# Patient Record
Sex: Female | Born: 1935
Health system: Southern US, Community
[De-identification: ages and names within clinical notes are randomized; demographics above are authoritative.]

## PROBLEM LIST (undated history)

## (undated) DIAGNOSIS — K5900 Constipation, unspecified: Secondary | ICD-10-CM

## (undated) DIAGNOSIS — K219 Gastro-esophageal reflux disease without esophagitis: Secondary | ICD-10-CM

## (undated) DIAGNOSIS — Z862 Personal history of diseases of the blood and blood-forming organs and certain disorders involving the immune mechanism: Secondary | ICD-10-CM

## (undated) DIAGNOSIS — J309 Allergic rhinitis, unspecified: Secondary | ICD-10-CM

## (undated) DIAGNOSIS — I10 Essential (primary) hypertension: Secondary | ICD-10-CM

## (undated) DIAGNOSIS — Z8639 Personal history of other endocrine, nutritional and metabolic disease: Secondary | ICD-10-CM

## (undated) DIAGNOSIS — F411 Generalized anxiety disorder: Secondary | ICD-10-CM

## (undated) DIAGNOSIS — R413 Other amnesia: Secondary | ICD-10-CM

## (undated) DIAGNOSIS — M1990 Unspecified osteoarthritis, unspecified site: Secondary | ICD-10-CM

## (undated) HISTORY — DX: Gastro-esophageal reflux disease without esophagitis: K21.9

## (undated) HISTORY — DX: Unspecified osteoarthritis, unspecified site: M19.90

## (undated) HISTORY — PX: TOTAL KNEE ARTHROPLASTY: SHX125

## (undated) HISTORY — DX: Generalized anxiety disorder: F41.1

## (undated) HISTORY — DX: Personal history of diseases of the blood and blood-forming organs and certain disorders involving the immune mechanism: Z86.2

## (undated) HISTORY — DX: Personal history of other endocrine, nutritional and metabolic disease: Z86.39

## (undated) HISTORY — DX: Essential (primary) hypertension: I10

## (undated) HISTORY — DX: Constipation, unspecified: K59.00

## (undated) HISTORY — DX: Allergic rhinitis, unspecified: J30.9

---

## 2003-05-29 ENCOUNTER — Encounter: Payer: Self-pay | Admitting: Internal Medicine

## 2003-12-26 ENCOUNTER — Encounter: Payer: Self-pay | Admitting: Internal Medicine

## 2005-04-09 ENCOUNTER — Other Ambulatory Visit: Admission: RE | Admit: 2005-04-09 | Discharge: 2005-04-09 | Payer: Self-pay | Admitting: Family Medicine

## 2005-06-30 ENCOUNTER — Encounter: Admission: RE | Admit: 2005-06-30 | Discharge: 2005-06-30 | Payer: Self-pay | Admitting: Family Medicine

## 2006-01-24 ENCOUNTER — Emergency Department (HOSPITAL_COMMUNITY): Admission: EM | Admit: 2006-01-24 | Discharge: 2006-01-24 | Payer: Self-pay | Admitting: Emergency Medicine

## 2006-03-18 ENCOUNTER — Encounter: Admission: RE | Admit: 2006-03-18 | Discharge: 2006-03-18 | Payer: Self-pay | Admitting: Family Medicine

## 2006-11-11 ENCOUNTER — Encounter: Payer: Self-pay | Admitting: Internal Medicine

## 2006-11-11 ENCOUNTER — Ambulatory Visit: Payer: Self-pay | Admitting: Internal Medicine

## 2006-11-11 DIAGNOSIS — M1990 Unspecified osteoarthritis, unspecified site: Secondary | ICD-10-CM | POA: Insufficient documentation

## 2006-11-11 DIAGNOSIS — F411 Generalized anxiety disorder: Secondary | ICD-10-CM

## 2006-11-11 DIAGNOSIS — I10 Essential (primary) hypertension: Secondary | ICD-10-CM | POA: Insufficient documentation

## 2006-11-11 DIAGNOSIS — J309 Allergic rhinitis, unspecified: Secondary | ICD-10-CM

## 2006-11-11 HISTORY — DX: Allergic rhinitis, unspecified: J30.9

## 2006-11-11 HISTORY — DX: Essential (primary) hypertension: I10

## 2006-11-11 HISTORY — DX: Unspecified osteoarthritis, unspecified site: M19.90

## 2006-11-11 HISTORY — DX: Generalized anxiety disorder: F41.1

## 2006-12-01 ENCOUNTER — Ambulatory Visit: Payer: Self-pay | Admitting: Internal Medicine

## 2006-12-23 ENCOUNTER — Ambulatory Visit: Payer: Self-pay | Admitting: Internal Medicine

## 2007-01-19 ENCOUNTER — Ambulatory Visit: Payer: Self-pay | Admitting: Internal Medicine

## 2007-05-06 ENCOUNTER — Ambulatory Visit: Payer: Self-pay | Admitting: Internal Medicine

## 2007-05-13 ENCOUNTER — Encounter: Payer: Self-pay | Admitting: Internal Medicine

## 2007-07-06 ENCOUNTER — Ambulatory Visit: Payer: Self-pay | Admitting: Internal Medicine

## 2007-07-20 ENCOUNTER — Encounter: Payer: Self-pay | Admitting: Internal Medicine

## 2007-07-28 ENCOUNTER — Encounter: Payer: Self-pay | Admitting: Internal Medicine

## 2007-10-05 ENCOUNTER — Encounter: Payer: Self-pay | Admitting: Internal Medicine

## 2007-10-06 ENCOUNTER — Ambulatory Visit: Payer: Self-pay | Admitting: Internal Medicine

## 2007-10-06 DIAGNOSIS — K219 Gastro-esophageal reflux disease without esophagitis: Secondary | ICD-10-CM

## 2007-10-06 HISTORY — DX: Gastro-esophageal reflux disease without esophagitis: K21.9

## 2007-10-06 LAB — CONVERTED CEMR LAB
Bilirubin Urine: NEGATIVE
Specific Gravity, Urine: 1.01
Urobilinogen, UA: 0.2

## 2007-10-07 LAB — CONVERTED CEMR LAB
ALT: 22 units/L (ref 0–35)
AST: 25 units/L (ref 0–37)
Alkaline Phosphatase: 76 units/L (ref 39–117)
Basophils Absolute: 0 10*3/uL (ref 0.0–0.1)
Basophils Relative: 0.4 % (ref 0.0–1.0)
Bilirubin, Direct: 0.1 mg/dL (ref 0.0–0.3)
CO2: 35 meq/L — ABNORMAL HIGH (ref 19–32)
Chloride: 106 meq/L (ref 96–112)
Cholesterol: 195 mg/dL (ref 0–200)
LDL Cholesterol: 122 mg/dL — ABNORMAL HIGH (ref 0–99)
Lymphocytes Relative: 27.7 % (ref 12.0–46.0)
MCHC: 33.6 g/dL (ref 30.0–36.0)
Neutrophils Relative %: 65.3 % (ref 43.0–77.0)
Potassium: 2.9 meq/L — ABNORMAL LOW (ref 3.5–5.1)
RBC: 4.35 M/uL (ref 3.87–5.11)
RDW: 13.1 % (ref 11.5–14.6)
Sodium: 146 meq/L — ABNORMAL HIGH (ref 135–145)
Total Bilirubin: 0.7 mg/dL (ref 0.3–1.2)
VLDL: 20 mg/dL (ref 0–40)

## 2008-02-09 ENCOUNTER — Ambulatory Visit: Payer: Self-pay | Admitting: Internal Medicine

## 2008-02-09 DIAGNOSIS — Z862 Personal history of diseases of the blood and blood-forming organs and certain disorders involving the immune mechanism: Secondary | ICD-10-CM

## 2008-02-09 DIAGNOSIS — Z8639 Personal history of other endocrine, nutritional and metabolic disease: Secondary | ICD-10-CM | POA: Insufficient documentation

## 2008-02-09 HISTORY — DX: Personal history of diseases of the blood and blood-forming organs and certain disorders involving the immune mechanism: Z86.39

## 2008-02-09 HISTORY — DX: Personal history of diseases of the blood and blood-forming organs and certain disorders involving the immune mechanism: Z86.2

## 2008-02-09 LAB — CONVERTED CEMR LAB
CO2: 32 meq/L (ref 19–32)
Calcium: 9.4 mg/dL (ref 8.4–10.5)
Creatinine, Ser: 0.8 mg/dL (ref 0.4–1.2)
GFR calc non Af Amer: 75 mL/min
Glucose, Bld: 114 mg/dL — ABNORMAL HIGH (ref 70–99)

## 2008-02-22 ENCOUNTER — Telehealth: Payer: Self-pay | Admitting: Internal Medicine

## 2008-02-23 ENCOUNTER — Ambulatory Visit: Payer: Self-pay | Admitting: Internal Medicine

## 2008-02-23 DIAGNOSIS — K5900 Constipation, unspecified: Secondary | ICD-10-CM

## 2008-02-23 HISTORY — DX: Constipation, unspecified: K59.00

## 2008-06-13 ENCOUNTER — Ambulatory Visit: Payer: Self-pay | Admitting: Internal Medicine

## 2008-06-13 DIAGNOSIS — B373 Candidiasis of vulva and vagina: Secondary | ICD-10-CM | POA: Insufficient documentation

## 2008-06-13 DIAGNOSIS — B3731 Acute candidiasis of vulva and vagina: Secondary | ICD-10-CM | POA: Insufficient documentation

## 2008-07-20 ENCOUNTER — Encounter: Payer: Self-pay | Admitting: Internal Medicine

## 2008-12-25 ENCOUNTER — Encounter: Payer: Self-pay | Admitting: Internal Medicine

## 2008-12-26 ENCOUNTER — Ambulatory Visit: Payer: Self-pay | Admitting: Internal Medicine

## 2008-12-26 LAB — CONVERTED CEMR LAB
Albumin: 4.1 g/dL (ref 3.5–5.2)
BUN: 12 mg/dL (ref 6–23)
Basophils Absolute: 0 10*3/uL (ref 0.0–0.1)
CO2: 32 meq/L (ref 19–32)
Calcium: 9.4 mg/dL (ref 8.4–10.5)
Cholesterol: 171 mg/dL (ref 0–200)
Eosinophils Absolute: 0.1 10*3/uL (ref 0.0–0.7)
Glucose, Bld: 104 mg/dL — ABNORMAL HIGH (ref 70–99)
HDL: 56.5 mg/dL (ref >39.00)
Hemoglobin: 12.6 g/dL (ref 12.0–15.0)
Lymphocytes Relative: 25.4 % (ref 12.0–46.0)
Lymphs Abs: 1.7 10*3/uL (ref 0.7–4.0)
MCHC: 33.3 g/dL (ref 30.0–36.0)
Neutro Abs: 4.4 10*3/uL (ref 1.4–7.7)
Platelets: 312 10*3/uL (ref 150.0–400.0)
RDW: 13.8 % (ref 11.5–14.6)
Sodium: 144 meq/L (ref 135–145)
TSH: 4.2 microintl units/mL (ref 0.35–5.50)
Triglycerides: 110 mg/dL (ref 0.0–149.0)

## 2009-01-15 ENCOUNTER — Ambulatory Visit: Payer: Self-pay | Admitting: Internal Medicine

## 2009-01-18 ENCOUNTER — Telehealth (INDEPENDENT_AMBULATORY_CARE_PROVIDER_SITE_OTHER): Payer: Self-pay | Admitting: *Deleted

## 2009-05-01 ENCOUNTER — Telehealth: Payer: Self-pay | Admitting: Internal Medicine

## 2009-06-19 ENCOUNTER — Ambulatory Visit: Payer: Self-pay | Admitting: Internal Medicine

## 2009-07-31 ENCOUNTER — Encounter: Payer: Self-pay | Admitting: Internal Medicine

## 2009-11-08 ENCOUNTER — Encounter: Payer: Self-pay | Admitting: Internal Medicine

## 2009-12-27 ENCOUNTER — Ambulatory Visit: Payer: Self-pay | Admitting: Internal Medicine

## 2009-12-27 LAB — CONVERTED CEMR LAB
Albumin: 4.3 g/dL (ref 3.5–5.2)
Alkaline Phosphatase: 72 units/L (ref 39–117)
BUN: 17 mg/dL (ref 6–23)
Basophils Absolute: 0 10*3/uL (ref 0.0–0.1)
Bilirubin, Direct: 0.1 mg/dL (ref 0.0–0.3)
CO2: 29 meq/L (ref 19–32)
Calcium: 8.9 mg/dL (ref 8.4–10.5)
Creatinine, Ser: 0.8 mg/dL (ref 0.4–1.2)
Eosinophils Absolute: 0.1 10*3/uL (ref 0.0–0.7)
Glucose, Bld: 110 mg/dL — ABNORMAL HIGH (ref 70–99)
HDL: 57.7 mg/dL (ref >39.00)
Lymphocytes Relative: 24.2 % (ref 12.0–46.0)
MCHC: 33.6 g/dL (ref 30.0–36.0)
Neutro Abs: 4.8 10*3/uL (ref 1.4–7.7)
Neutrophils Relative %: 69 % (ref 43.0–77.0)
RDW: 15.8 % — ABNORMAL HIGH (ref 11.5–14.6)
Triglycerides: 120 mg/dL (ref 0.0–149.0)

## 2009-12-31 ENCOUNTER — Telehealth: Payer: Self-pay | Admitting: Internal Medicine

## 2010-04-17 ENCOUNTER — Encounter: Payer: Self-pay | Admitting: Internal Medicine

## 2010-04-17 ENCOUNTER — Ambulatory Visit: Payer: Self-pay | Admitting: Internal Medicine

## 2010-06-23 ENCOUNTER — Encounter: Payer: Self-pay | Admitting: Family Medicine

## 2010-07-02 NOTE — Assessment & Plan Note (Signed)
Summary: 4 mos rov   Vital Signs:  Patient profile:   77 year old female Weight:      194 pounds Temp:     98.1 degrees F oral BP sitting:   128 / 80  (left arm) Cuff size:   regular  Vitals Entered By: Duard Brady LPN (April 17, 2010 8:19 AM) CC: 4 mos rov -doing well Is Patient Diabetic? No   CC:  4 mos rov -doing well.  History of Present Illness: 77 -year-old patient seen today for follow-up of hypertension.  She has osteoarthritis and complains of knee pain.  She has gastroesophageal reflux disease, mild anxiety disorder.  All of which have been stable.  Her weight is 194.  She complains of knee pain interfering with an exercise regimen.  She complains of fatigue.  She is very sedentary.  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 G42P3A0 Hosp age 33 for BP eval Allergic rhinitis Anxiety Hypertension Osteoarthritis GERD  Review of Systems       The patient complains of difficulty walking.  The patient denies anorexia, fever, weight loss, weight gain, vision loss, decreased hearing, hoarseness, chest pain, syncope, dyspnea on exertion, peripheral edema, prolonged cough, headaches, hemoptysis, abdominal pain, melena, hematochezia, severe indigestion/heartburn, hematuria, incontinence, genital sores, muscle weakness, suspicious skin lesions, transient blindness, depression, unusual weight change, abnormal bleeding, enlarged lymph nodes, angioedema, and breast masses.    Physical Exam  General:  overweight-appearing.  normal blood pressureoverweight-appearing.   Head:  Normocephalic and atraumatic without obvious abnormalities. No apparent alopecia or balding. Eyes:  No corneal or conjunctival inflammation noted. EOMI. Perrla. Funduscopic exam benign, without hemorrhages, exudates or papilledema. Vision grossly  normal. Mouth:  Oral mucosa and oropharynx without lesions or exudates.  Teeth in good repair. Neck:  No deformities, masses, or tenderness noted. Lungs:  Normal respiratory effort, chest expands symmetrically. Lungs are clear to auscultation, no crackles or wheezes. Heart:  Normal rate and regular rhythm. S1 and S2 normal without gallop, murmur, click, rub or other extra sounds. Abdomen:  Bowel sounds positive,abdomen soft and non-tender without masses, organomegaly or hernias noted. Msk:  No deformity or scoliosis noted of thoracic or lumbar spine.   Pulses:  R and L carotid,radial,femoral,dorsalis pedis and posterior tibial pulses are full and equal bilaterally Extremities:  No clubbing, cyanosis, edema, or deformity noted with normal full range of motion of all joints.     Impression & Recommendations:  Problem # 1:  OSTEOARTHRITIS (ICD-715.90)  Problem # 2:  HYPERTENSION (ICD-401.9)  Her updated medication list for this problem includes:    Hydrochlorothiazide 25 Mg Tabs (Hydrochlorothiazide) .Marland Kitchen... 1 once daily    Micardis 80 Mg Tabs (Telmisartan) .Marland Kitchen... 1 once daily    Amlodipine Besylate 10 Mg Tabs (Amlodipine besylate) .Marland Kitchen... 1 once daily  Her updated medication list for this problem includes:    Hydrochlorothiazide 25 Mg Tabs (Hydrochlorothiazide) .Marland Kitchen... 1 once daily    Micardis 80 Mg Tabs (Telmisartan) .Marland Kitchen... 1 once daily    Amlodipine Besylate 10 Mg Tabs (Amlodipine besylate) .Marland Kitchen... 1 once daily  Complete Medication List: 1)  Buspar 15 Mg Tabs (Buspirone hcl) .... 1/2 once daily 2)  Hydrochlorothiazide 25 Mg Tabs (Hydrochlorothiazide) .Marland Kitchen.. 1 once daily 3)  Micardis 80 Mg Tabs (Telmisartan) .Marland Kitchen.. 1 once daily 4)  Klor-con M10 10 Meq Tbcr (Potassium chloride crys cr) .Marland KitchenMarland KitchenMarland Kitchen  1 two times a day 5)  Amlodipine Besylate 10 Mg Tabs (Amlodipine besylate) .Marland Kitchen.. 1 once daily 6)  Nystatin-triamcinolone 100000-0.1 Unit/gm-% Oint (Nystatin-triamcinolone) .... Use  twice daily  Patient  Instructions: 1)  Please schedule a follow-up appointment in 4 months. 2)  Limit your Sodium (Salt). 3)  It is important that you exercise regularly at least 20 minutes 5 times a week. If you develop chest pain, have severe difficulty breathing, or feel very tired , stop exercising immediately and seek medical attention. 4)  You need to lose weight. Consider a lower calorie diet and regular exercise.    Orders Added: 1)  Est. Patient Level III [16109]   Immunization History:  Tetanus/Td Immunization History:    Tetanus/Td:  Historical (04/09/2005)  Influenza Immunization History:    Influenza:  Historical (03/16/2010)  Pneumovax Immunization History:    Pneumovax:  Historical (03/02/2007)   Immunization History:  Influenza Immunization History:    Influenza:  Historical (03/16/2010) given at hospital per pt. Earlean Polka

## 2010-07-02 NOTE — Progress Notes (Signed)
Summary:  ? about colonoscopy?  Phone Note Call from Patient   Caller: Patient Call For: Gordy Savers  MD Summary of Call: [employer] company is calling and telling pt that is overdue for her colonscopy, and she understood she was not due until 2013.  Would like to ask Dr. Kirtland Bouchard.  She does not [place] having a [place]  Does not have their phone number. [phone] Initial call taken by: Lynann Beaver CMA,  December 31, 2009 2:02 PM  Follow-up for Phone Call        follow-up screening  colonoscopy, not due until July 2015 Follow-up by: Gordy Savers  MD,  December 31, 2009 5:05 PM  Additional Follow-up for Phone Call Additional follow up Details #1::        Notified. pt. Additional Follow-up by: Lynann Beaver CMA,  January 01, 2010 8:02 AM

## 2010-07-02 NOTE — Letter (Signed)
Summary: [facility]  [facility]   Imported By: Maryln Gottron 11/13/2009 14:28:06  _____________________________________________________________________  External Attachment:    Type:   Image     Comment:   External Document

## 2010-07-02 NOTE — Assessment & Plan Note (Signed)
Summary: PT WILL COME IN FASTING/NJR   Vital Signs:  Patient profile:   77 year old female Height:      61.75 inches Weight:      195 pounds BMI:     36.09 Temp:     98.6 degrees F oral Pulse rate:   62 / minute Pulse rhythm:   regular Resp:     16 per minute BP sitting:   130 / 80  (right arm) Cuff size:   regular  Vitals Entered By: Duard Brady LPN (December 27, 2009 8:31 AM) CC: cpx - doing well  Is Patient Diabetic? No   CC:  cpx - doing well .  History of Present Illness: 77 year old patient who is seen today for a comprehensive evaluation.  Medical problems include treated hypertension.  She has a history of osteoarthritis mainly affecting the knees.  She is followed closely by orthopedics.  She has anxiety disorder and gastroesophageal reflux disease. Here for Medicare AWV:  1.   Risk factors based on Past M, S, F history:  patient has a history of treated hypertension.  Family history of cerebrovascular disease 2.   Physical Activities: limited due to bilateral knee pain 3.   Depression/mood: history anxiety, but no history of depression 4.   Hearing: no deficit 5.   ADL's: independent in all aspects of daily living 6.   Fall Risk: low 7.   Home Safety: no problems identified 8.   Height, weight, &visual acuity:height and weight stable.  No difficulty with visual acuity 9.   Counseling: follow-up gynecology next week 10.   Labs ordered based on risk factors: laboratory profile, including TSH, and lipid profile will be reviewed 11.           Referral Coordination- GYN and orthopedic referral 12.           Care Plan-  heart healthy diet, weight loss encouraged 13.            Cognitive Assessment- alert and oriented, with normal affect   Preventive Screening-Counseling & Management  Alcohol-Tobacco     Smoking Status: never  Allergies (verified): No Known Drug Allergies  Past History:  Past Medical History: Reviewed history from 10/06/2007 and no changes  required. G34P3A0 Hosp age 77 for BP eval Allergic rhinitis Anxiety Hypertension Osteoarthritis GERD  Past Surgical History: Reviewed history from 10/06/2007 and no changes required. melanoma removed from back colonoscopy July 2005 2-D echocardiogram December 2004 stress thallium January 2005  Family History: Reviewed history from 12/26/2008 and no changes required. Family History Hypertension Family History Other cancer: sister breast ca Family History of Stroke F 1st degree relative <60 father died age 56, CVA mother died age 27, CVA  Four brothers 3 sisters  Positive for CVD hypertension, breast cancer one brother died of a stroke at age 63 sister died of breast cancer, age 65 one brother died of CVA  Social History: Reviewed history from 10/06/2007 and no changes required. Married 3 children, and 3 grandchildren  Review of Systems       The patient complains of difficulty walking.  The patient denies anorexia, fever, weight loss, weight gain, vision loss, decreased hearing, hoarseness, chest pain, syncope, dyspnea on exertion, peripheral edema, prolonged cough, headaches, hemoptysis, abdominal pain, melena, hematochezia, severe indigestion/heartburn, hematuria, incontinence, genital sores, muscle weakness, suspicious skin lesions, transient blindness, depression, unusual weight change, abnormal bleeding, enlarged lymph nodes, angioedema, and breast masses.    Physical Exam  General:  overweight-appearing.  122/80overweight-appearing.  Head:  Normocephalic and atraumatic without obvious abnormalities. No apparent alopecia or balding. Eyes:  No corneal or conjunctival inflammation noted. EOMI. Perrla. Funduscopic exam benign, without hemorrhages, exudates or papilledema. Vision grossly normal. Ears:  External ear exam shows no significant lesions or deformities.  Otoscopic examination reveals clear canals, tympanic membranes are intact bilaterally without bulging,  retraction, inflammation or discharge. Hearing is grossly normal bilaterally. Nose:  External nasal examination shows no deformity or inflammation. Nasal mucosa are pink and moist without lesions or exudates. Mouth:  Oral mucosa and oropharynx without lesions or exudates.  Teeth in good repair. Neck:  No deformities, masses, or tenderness noted. Chest Wall:  No deformities, masses, or tenderness noted. Breasts:  No mass, nodules, thickening, tenderness, bulging, retraction, inflamation, nipple discharge or skin changes noted.   Lungs:  Normal respiratory effort, chest expands symmetrically. Lungs are clear to auscultation, no crackles or wheezes. Heart:  Normal rate and regular rhythm. S1 and S2 normal without gallop, murmur, click, rub or other extra sounds. Abdomen:  Bowel sounds positive,abdomen soft and non-tender without masses, organomegaly or hernias noted. Msk:  No deformity or scoliosis noted of thoracic or lumbar spine.   Pulses:  R and L carotid,radial,femoral,dorsalis pedis and posterior tibial pulses are full and equal bilaterally Extremities:  No clubbing, cyanosis, edema, or deformity noted with normal full range of motion of all joints.   Neurologic:  No cranial nerve deficits noted. Station and gait are normal. Plantar reflexes are down-going bilaterally. DTRs are symmetrical throughout. Sensory, motor and coordinative functions appear intact. Skin:  Intact without suspicious lesions or rashes Cervical Nodes:  No lymphadenopathy noted Axillary Nodes:  No palpable lymphadenopathy Inguinal Nodes:  No significant adenopathy Psych:  Cognition and judgment appear intact. Alert and cooperative with normal attention span and concentration. No apparent delusions, illusions, hallucinations   Impression & Recommendations:  Problem # 1:  PREVENTIVE HEALTH CARE (ICD-V70.0)  Orders: First annual wellness visit with prevention plan  (E4540) Venipuncture (98119) TLB-Lipid Panel  (80061-LIPID) TLB-BMP (Basic Metabolic Panel-BMET) (80048-METABOL) TLB-CBC Platelet - w/Differential (85025-CBCD) TLB-Hepatic/Liver Function Pnl (80076-HEPATIC) TLB-TSH (Thyroid Stimulating Hormone) (84443-TSH)  Problem # 2:  OSTEOARTHRITIS (ICD-715.90)  Problem # 3:  HYPERTENSION (ICD-401.9)  Her updated medication list for this problem includes:    Hydrochlorothiazide 25 Mg Tabs (Hydrochlorothiazide) .Marland Kitchen... 1 once daily    Micardis 80 Mg Tabs (Telmisartan) .Marland Kitchen... 1 once daily    Amlodipine Besylate 10 Mg Tabs (Amlodipine besylate) .Marland Kitchen... 1 once daily  Orders: EKG w/ Interpretation (93000) Prescription Created Electronically [phone])  Her updated medication list for this problem includes:    Hydrochlorothiazide 25 Mg Tabs (Hydrochlorothiazide) .Marland Kitchen... 1 once daily    Micardis 80 Mg Tabs (Telmisartan) .Marland Kitchen... 1 once daily    Amlodipine Besylate 10 Mg Tabs (Amlodipine besylate) .Marland Kitchen... 1 once daily  Problem # 4:  ANXIETY (ICD-300.00)  Her updated medication list for this problem includes:    Buspar 15 Mg Tabs (Buspirone hcl) .Marland Kitchen... 1/2 once daily  Her updated medication list for this problem includes:    Buspar 15 Mg Tabs (Buspirone hcl) .Marland Kitchen... 1/2 once daily  Orders: Prescription Created Electronically [id number])  Complete Medication List: 1)  Buspar 15 Mg Tabs (Buspirone hcl) .... 1/2 once daily 2)  Hydrochlorothiazide 25 Mg Tabs (Hydrochlorothiazide) .Marland Kitchen.. 1 once daily 3)  Micardis 80 Mg Tabs (Telmisartan) .Marland Kitchen.. 1 once daily 4)  Klor-con M10 10 Meq Tbcr (Potassium chloride crys cr) .Marland Kitchen.. 1 two times a day 5)  Amlodipine Besylate 10 Mg  Tabs (Amlodipine besylate) .Marland Kitchen.. 1 once daily 6)  Nystatin-triamcinolone 100000-0.1 Unit/gm-% Oint (Nystatin-triamcinolone) .... Use  twice daily  Other Orders: Specimen Handling (16109)  Patient Instructions: 1)  Please schedule a follow-up appointment in 4 months. 2)  Limit your Sodium (Salt). 3)  It is important that you exercise regularly at least 20  minutes 5 times a week. If you develop chest pain, have severe difficulty breathing, or feel very tired , stop exercising immediately and seek medical attention. 4)  You need to lose weight. Consider a lower calorie diet and regular exercise.  5)  Check your Blood Pressure regularly. If it is above: 150/90 you should make an appointment. Prescriptions: AMLODIPINE BESYLATE 10 MG  TABS (AMLODIPINE BESYLATE) 1 once daily  #90 x 6   Entered and Authorized by:   Gordy Savers  MD   Signed by:   Gordy Savers  MD on 12/27/2009   Method used:   Print then Give to Patient   RxID:   [id number] KLOR-CON M10 10 MEQ  TBCR (POTASSIUM CHLORIDE CRYS CR) 1 two times a day  #180 x 6   Entered and Authorized by:   Gordy Savers  MD   Signed by:   Gordy Savers  MD on 12/27/2009   Method used:   Print then Give to Patient   RxID:   [id number] MICARDIS 80 MG  TABS (TELMISARTAN) 1 once daily  #90 x 6   Entered and Authorized by:   Gordy Savers  MD   Signed by:   Gordy Savers  MD on 12/27/2009   Method used:   Print then Give to Patient   RxID:   [id number] HYDROCHLOROTHIAZIDE 25 MG  TABS (HYDROCHLOROTHIAZIDE) 1 once daily  #90 x 6   Entered and Authorized by:   Gordy Savers  MD   Signed by:   Gordy Savers  MD on 12/27/2009   Method used:   Print then Give to Patient   RxID:   [id number] BUSPAR 15 MG  TABS (BUSPIRONE HCL) 1/2 once daily  #90 x 6   Entered and Authorized by:   Gordy Savers  MD   Signed by:   Gordy Savers  MD on 12/27/2009   Method used:   Print then Give to Patient   RxID:   [id number] AMLODIPINE BESYLATE 10 MG  TABS (AMLODIPINE BESYLATE) 1 once daily  #90 x 6   Entered and Authorized by:   Gordy Savers  MD   Signed by:   Gordy Savers  MD on 12/27/2009   Method used:   Electronically to        [facility]* (retail)             ,          Ph: [phone]       Fax:  [phone]   RxID:   [id number] KLOR-CON M10 10 MEQ  TBCR (POTASSIUM CHLORIDE CRYS CR) 1 two times a day  #180 x 6   Entered and Authorized by:   Gordy Savers  MD   Signed by:   Gordy Savers  MD on 12/27/2009   Method used:   Electronically to        [facility]* (retail)             ,          Ph: [phone]       Fax:  [phone]   RxID:   [id number] MICARDIS 80 MG  TABS (TELMISARTAN) 1 once daily  #90 x 6   Entered and Authorized by:   Gordy Savers  MD   Signed by:   Gordy Savers  MD on 12/27/2009   Method used:   Electronically to        [facility]* (retail)             ,          Ph: [phone]       Fax: [phone]   RxID:   [id number] HYDROCHLOROTHIAZIDE 25 MG  TABS (HYDROCHLOROTHIAZIDE) 1 once daily  #90 x 6   Entered and Authorized by:   Gordy Savers  MD   Signed by:   Gordy Savers  MD on 12/27/2009   Method used:   Electronically to        [facility]* (retail)             ,          Ph: [phone]       Fax: [phone]   RxID:   [id number] BUSPAR 15 MG  TABS (BUSPIRONE HCL) 1/2 once daily  #90 x 6   Entered and Authorized by:   Gordy Savers  MD   Signed by:   Gordy Savers  MD on 12/27/2009   Method used:   Electronically to        [facility]* (retail)             ,          Ph: [phone]       Fax: [phone]   RxID:   [id number]

## 2010-07-02 NOTE — Assessment & Plan Note (Signed)
Summary: 6 month rov/njr   Vital Signs:  Patient profile:   77 year old female Weight:      194 pounds Temp:     98.5 degrees F oral BP sitting:   148 / 88  (left arm) Cuff size:   regular  Vitals Entered By: Raechel Ache, RN (June 19, 2009 8:32 AM) CC: 6 mo ROV   CC:  6 mo ROV.  History of Present Illness: 77 year old patient  who is seen today for follow-up of her hypertension.  since her last visit here.  She has been evaluated by ENT.  Complains today includes some constipation.  Her last colonoscopy was in 2005 and also has some concerns about toenail discoloration.  She has a history of gastroesophageal reflux disease, which has been stable.  Also, since her last visit here.  She has been treated for a monilial vaginitis.  Allergies: No Known Drug Allergies  Past History:  Past Medical History: Reviewed history from 10/06/2007 and no changes required. G13P3A0 [facility] age 45 for BP eval Allergic rhinitis Anxiety Hypertension Osteoarthritis GERD  Past Surgical History: Reviewed history from 10/06/2007 and no changes required. melanoma removed from back colonoscopy July 2005 2-D echocardiogram December 2004 stress thallium January 2005  Review of Systems  The patient denies anorexia, fever, weight loss, weight gain, vision loss, decreased hearing, hoarseness, chest pain, syncope, dyspnea on exertion, peripheral edema, prolonged cough, headaches, hemoptysis, abdominal pain, melena, hematochezia, severe indigestion/heartburn, hematuria, incontinence, genital sores, muscle weakness, suspicious skin lesions, transient blindness, difficulty walking, depression, unusual weight change, abnormal bleeding, enlarged lymph nodes, angioedema, and breast masses.    Physical Exam  General:  overweight-appearing.  overweight-appearing.   Head:  Normocephalic and atraumatic without obvious abnormalities. No apparent alopecia or balding. Eyes:  No corneal or conjunctival  inflammation noted. EOMI. Perrla. Funduscopic exam benign, without hemorrhages, exudates or papilledema. Vision grossly normal. Mouth:  Oral mucosa and oropharynx without lesions or exudates.  Teeth in good repair. Neck:  No deformities, masses, or tenderness noted. Lungs:  Normal respiratory effort, chest expands symmetrically. Lungs are clear to auscultation, no crackles or wheezes. Heart:  Normal rate and regular rhythm. S1 and S2 normal without gallop, murmur, click, rub or other extra sounds. Abdomen:  Bowel sounds positive,abdomen soft and non-tender without masses, organomegaly or hernias noted. Msk:  No deformity or scoliosis noted of thoracic or lumbar spine.   Pulses:  R and L carotid,radial,femoral,dorsalis pedis and posterior tibial pulses are full and equal bilaterally Skin:  onychomycotic nail changes involving her left first and second toes   Impression & Recommendations:  Problem # 1:  CONSTIPATION (ICD-564.00)  discussed.  Will increase her fluid intake exercise level and fiber in her diet.  Will add a stool softener  Orders: Prescription Created Electronically [phone])  Problem # 2:  HYPERTENSION (ICD-401.9)  Her updated medication list for this problem includes:    Hydrochlorothiazide 25 Mg Tabs (Hydrochlorothiazide) .Marland Kitchen... 1 once daily    Micardis 80 Mg Tabs (Telmisartan) .Marland Kitchen... 1 once daily    Amlodipine Besylate 10 Mg Tabs (Amlodipine besylate) .Marland Kitchen... 1 once daily  Her updated medication list for this problem includes:    Hydrochlorothiazide 25 Mg Tabs (Hydrochlorothiazide) .Marland Kitchen... 1 once daily    Micardis 80 Mg Tabs (Telmisartan) .Marland Kitchen... 1 once daily    Amlodipine Besylate 10 Mg Tabs (Amlodipine besylate) .Marland Kitchen... 1 once daily  Problem # 3:  ALLERGIC RHINITIS (ICD-477.9)  Her updated medication list for this problem includes:  Fluticasone Propionate 50 Mcg/act Susp (Fluticasone propionate) ..... Use daily  Her updated medication list for this problem includes:     Fluticasone Propionate 50 Mcg/act Susp (Fluticasone propionate) ..... Use daily  Orders: Prescription Created Electronically [phone])  Complete Medication List: 1)  Buspar 15 Mg Tabs (Buspirone hcl) .... 1/2 once daily 2)  Hydrochlorothiazide 25 Mg Tabs (Hydrochlorothiazide) .Marland Kitchen.. 1 once daily 3)  Micardis 80 Mg Tabs (Telmisartan) .Marland Kitchen.. 1 once daily 4)  Klor-con M10 10 Meq Tbcr (Potassium chloride crys cr) .Marland Kitchen.. 1 two times a day 5)  Amlodipine Besylate 10 Mg Tabs (Amlodipine besylate) .Marland Kitchen.. 1 once daily 6)  Pantoprazole Sodium 40 Mg Tbec (Pantoprazole sodium) .... One daily 7)  Fluticasone Propionate 50 Mcg/act Susp (Fluticasone propionate) .... Use daily 8)  Nystatin-triamcinolone 100000-0.1 Unit/gm-% Oint (Nystatin-triamcinolone) .... Use  twice daily  Patient Instructions: 1)  Please schedule a follow-up appointment in 6 months CPX 2)  Limit your Sodium (Salt) to less than 2 grams a day(slightly less than 1/2 a teaspoon) to prevent fluid retention, swelling, or worsening of symptoms. 3)  Avoid foods high in acid (tomatoes, citrus juices, spicy foods). Avoid eating within two hours of lying down or before exercising. Do not over eat; try smaller more frequent meals. Elevate head of bed twelve inches when sleeping. 4)  Check your Blood Pressure regularly. If it is above: you should make an appointment. Prescriptions: NYSTATIN-TRIAMCINOLONE 100000-0.1 UNIT/GM-% OINT (NYSTATIN-TRIAMCINOLONE) use  twice daily  #60 gm x 2   Entered and Authorized by:   Gordy Savers  MD   Signed by:   Gordy Savers  MD on 06/19/2009   Method used:   Print then Give to Patient   RxID:   [id number] PANTOPRAZOLE SODIUM 40 MG  TBEC (PANTOPRAZOLE SODIUM) one daily  #90 x 6   Entered and Authorized by:   Gordy Savers  MD   Signed by:   Gordy Savers  MD on 06/19/2009   Method used:   Print then Give to Patient   RxID:   [id number] AMLODIPINE BESYLATE 10 MG  TABS (AMLODIPINE  BESYLATE) 1 once daily  #90 x 6   Entered and Authorized by:   Gordy Savers  MD   Signed by:   Gordy Savers  MD on 06/19/2009   Method used:   Print then Give to Patient   RxID:   [id number] KLOR-CON M10 10 MEQ  TBCR (POTASSIUM CHLORIDE CRYS CR) 1 two times a day  #180 x 6   Entered and Authorized by:   Gordy Savers  MD   Signed by:   Gordy Savers  MD on 06/19/2009   Method used:   Print then Give to Patient   RxID:   [id number] MICARDIS 80 MG  TABS (TELMISARTAN) 1 once daily  #90 x 6   Entered and Authorized by:   Gordy Savers  MD   Signed by:   Gordy Savers  MD on 06/19/2009   Method used:   Print then Give to Patient   RxID:   [id number] HYDROCHLOROTHIAZIDE 25 MG  TABS (HYDROCHLOROTHIAZIDE) 1 once daily  #90 x 6   Entered and Authorized by:   Gordy Savers  MD   Signed by:   Gordy Savers  MD on 06/19/2009   Method used:   Print then Give to Patient   RxID:   [id number] BUSPAR 15 MG  TABS (BUSPIRONE HCL) 1/2 once daily  #90  x 6   Entered and Authorized by:   Gordy Savers  MD   Signed by:   Gordy Savers  MD on 06/19/2009   Method used:   Print then Give to Patient   RxID:   [id number] NYSTATIN-TRIAMCINOLONE 100000-0.1 UNIT/GM-% OINT (NYSTATIN-TRIAMCINOLONE) use  twice daily  #60 gm x 2   Entered and Authorized by:   Gordy Savers  MD   Signed by:   Gordy Savers  MD on 06/19/2009   Method used:   Electronically to        [facility]* (mail-order)             ,          Ph: [phone]       Fax: [phone]   RxID:   [id number] PANTOPRAZOLE SODIUM 40 MG  TBEC (PANTOPRAZOLE SODIUM) one daily  #90 x 6   Entered and Authorized by:   Gordy Savers  MD   Signed by:   Gordy Savers  MD on 06/19/2009   Method used:   Electronically to        [facility]* (mail-order)             ,          Ph: [phone]       Fax: [phone]   RxID:    [id number] AMLODIPINE BESYLATE 10 MG  TABS (AMLODIPINE BESYLATE) 1 once daily  #90 x 6   Entered and Authorized by:   Gordy Savers  MD   Signed by:   Gordy Savers  MD on 06/19/2009   Method used:   Electronically to        [facility]* (mail-order)             ,          Ph: [phone]       Fax: [phone]   RxID:   [id number] KLOR-CON M10 10 MEQ  TBCR (POTASSIUM CHLORIDE CRYS CR) 1 two times a day  #180 x 6   Entered and Authorized by:   Gordy Savers  MD   Signed by:   Gordy Savers  MD on 06/19/2009   Method used:   Electronically to        [facility]* (mail-order)             ,          Ph: [phone]       Fax: [phone]   RxID:   [id number] MICARDIS 80 MG  TABS (TELMISARTAN) 1 once daily  #90 x 6   Entered and Authorized by:   Gordy Savers  MD   Signed by:   Gordy Savers  MD on 06/19/2009   Method used:   Electronically to        [facility]* (mail-order)             ,          Ph: [phone]       Fax: [phone]   RxID:   [id number] HYDROCHLOROTHIAZIDE 25 MG  TABS (HYDROCHLOROTHIAZIDE) 1 once daily  #90 x 6   Entered and Authorized by:   Gordy Savers  MD   Signed by:   Gordy Savers  MD on 06/19/2009   Method used:   Electronically to        [facility]* (mail-order)             ,  Ph: [phone]       Fax: [phone]   RxID:   [id number] BUSPAR 15 MG  TABS (BUSPIRONE HCL) 1/2 once daily  #90 x 6   Entered and Authorized by:   Gordy Savers  MD   Signed by:   Gordy Savers  MD on 06/19/2009   Method used:   Electronically to        [facility]* (mail-order)             ,          Ph: [phone]       Fax: [phone]   RxID:   [id number]

## 2010-09-10 ENCOUNTER — Encounter: Payer: Self-pay | Admitting: Internal Medicine

## 2010-09-11 ENCOUNTER — Ambulatory Visit (INDEPENDENT_AMBULATORY_CARE_PROVIDER_SITE_OTHER): Payer: Medicare HMO | Admitting: Internal Medicine

## 2010-09-11 ENCOUNTER — Encounter: Payer: Self-pay | Admitting: Internal Medicine

## 2010-09-11 DIAGNOSIS — K5900 Constipation, unspecified: Secondary | ICD-10-CM

## 2010-09-11 DIAGNOSIS — J309 Allergic rhinitis, unspecified: Secondary | ICD-10-CM

## 2010-09-11 DIAGNOSIS — I10 Essential (primary) hypertension: Secondary | ICD-10-CM

## 2010-09-11 NOTE — Patient Instructions (Signed)
Limit your sodium (Salt) intake  You need to lose weight.  Consider a lower calorie diet and regular exercise.    It is important that you exercise regularly, at least 20 minutes 3 to 4 times per week.  If you develop chest pain or shortness of breath seek  medical attention.  Return in 4 months for follow-up  

## 2010-09-11 NOTE — Progress Notes (Signed)
  Subjective:    Patient ID: Evelyn Irwin, female    DOB: 1933-06-27, 77 y.o.   MRN: [id number]  HPI  a 77 year old patient who is in today for followup of hypertension. She is doing fairly well but having some mild allergy related symptoms. She describes some situational stress due to to a move. She has some constipation issues which she is managing well. She denies any cardiopulmonary complaints. Medical regimen reviewed.    Review of Systems  Constitutional: Negative.   HENT: Negative for hearing loss, congestion, sore throat, rhinorrhea, dental problem, sinus pressure and tinnitus.   Eyes: Negative for pain, discharge and visual disturbance.  Respiratory: Negative for cough and shortness of breath.   Cardiovascular: Negative for chest pain, palpitations and leg swelling.  Gastrointestinal: Negative for nausea, vomiting, abdominal pain, diarrhea, constipation, blood in stool and abdominal distention.  Genitourinary: Negative for dysuria, urgency, frequency, hematuria, flank pain, vaginal bleeding, vaginal discharge, difficulty urinating, vaginal pain and pelvic pain.  Musculoskeletal: Negative for joint swelling, arthralgias and gait problem.  Skin: Negative for rash.  Neurological: Negative for dizziness, syncope, speech difficulty, weakness, numbness and headaches.  Hematological: Negative for adenopathy.  Psychiatric/Behavioral: Negative for behavioral problems, dysphoric mood and agitation. The patient is not nervous/anxious.        Objective:   Physical Exam  Constitutional: She is oriented to person, place, and time. She appears well-developed and well-nourished.       Overweight Blood pressure 146/88  HENT:  Head: Normocephalic.  Right Ear: External ear normal.  Left Ear: External ear normal.  Mouth/Throat: Oropharynx is clear and moist.  Eyes: Conjunctivae and EOM are normal. Pupils are equal, round, and reactive to light.  Neck: Normal range of motion. Neck supple. No  thyromegaly present.  Cardiovascular: Normal rate, regular rhythm, normal heart sounds and intact distal pulses.   Pulmonary/Chest: Effort normal and breath sounds normal.  Abdominal: Soft. Bowel sounds are normal. She exhibits no mass. There is no tenderness.  Musculoskeletal: Normal range of motion.  Lymphadenopathy:    She has no cervical adenopathy.  Neurological: She is alert and oriented to person, place, and time.  Skin: Skin is warm and dry. No rash noted.  Psychiatric: She has a normal mood and affect. Her behavior is normal.          Assessment & Plan:  Hypertension. We'll continue the present regimen fall restriction exercise and weight loss all encouraged Allergic rhinitis stable Constipation. Presently being managed well

## 2010-11-05 ENCOUNTER — Encounter: Payer: Self-pay | Admitting: Internal Medicine

## 2010-11-05 ENCOUNTER — Ambulatory Visit (INDEPENDENT_AMBULATORY_CARE_PROVIDER_SITE_OTHER): Payer: Medicare HMO | Admitting: Internal Medicine

## 2010-11-05 DIAGNOSIS — K5900 Constipation, unspecified: Secondary | ICD-10-CM

## 2010-11-05 DIAGNOSIS — M1990 Unspecified osteoarthritis, unspecified site: Secondary | ICD-10-CM

## 2010-11-05 DIAGNOSIS — I10 Essential (primary) hypertension: Secondary | ICD-10-CM

## 2010-11-05 NOTE — Progress Notes (Signed)
  Subjective:    Patient ID: Evelyn Irwin, female    DOB: 1934-01-10, 77 y.o.   MRN: [id number]  HPI  77 year old patient who is seen today for a presurgical evaluation. She has a history of advanced knee osteoarthritis and is scheduled for elective knee surgery on July 2. Clinically she is doing quite well she has treated hypertension which has been stable. She is on triple therapy and has been compliant and blood pressure has been well-controlled. She denies any cardiopulmonary complaints although her arthritis makes her activities quite limited; she denies any exertional chest pain or shortness of breath. She has some constipation issues that were discussed. She does take a daily stool softener with the control. She does have a history of hypokalemia and presently is on potassium supplementation    Review of Systems  Constitutional: Negative.   HENT: Negative for hearing loss, congestion, sore throat, rhinorrhea, dental problem, sinus pressure and tinnitus.   Eyes: Negative for pain, discharge and visual disturbance.  Respiratory: Negative for cough and shortness of breath.   Cardiovascular: Negative for chest pain, palpitations and leg swelling.  Gastrointestinal: Negative for nausea, vomiting, abdominal pain, diarrhea, constipation, blood in stool and abdominal distention.  Genitourinary: Negative for dysuria, urgency, frequency, hematuria, flank pain, vaginal bleeding, vaginal discharge, difficulty urinating, vaginal pain and pelvic pain.  Musculoskeletal: Positive for joint swelling and gait problem. Negative for arthralgias.  Skin: Negative for rash.  Neurological: Negative for dizziness, syncope, speech difficulty, weakness, numbness and headaches.  Hematological: Negative for adenopathy.  Psychiatric/Behavioral: Negative for behavioral problems, dysphoric mood and agitation. The patient is not nervous/anxious.        Objective:   Physical Exam  Constitutional: She is oriented to  person, place, and time. She appears well-developed and well-nourished.  HENT:  Head: Normocephalic.  Right Ear: External ear normal.  Left Ear: External ear normal.  Mouth/Throat: Oropharynx is clear and moist.  Eyes: Conjunctivae and EOM are normal. Pupils are equal, round, and reactive to light.  Neck: Normal range of motion. Neck supple. No thyromegaly present.  Cardiovascular: Normal rate, regular rhythm, normal heart sounds and intact distal pulses.   Pulmonary/Chest: Effort normal and breath sounds normal.  Abdominal: Soft. Bowel sounds are normal. She exhibits no mass. There is no tenderness.  Musculoskeletal: Normal range of motion.  Lymphadenopathy:    She has no cervical adenopathy.  Neurological: She is alert and oriented to person, place, and time.  Skin: Skin is warm and dry. No rash noted.  Psychiatric: She has a normal mood and affect. Her behavior is normal.          Assessment & Plan:    Hypertension well controlled Osteoarthritis Preoperative evaluation. Patient is clinically stable without any cardiopulmonary symptoms. Her blood pressure is well controlled. The patient is cleared for surgery. The cardiovascular evaluation deemed appropriate preoperatively;  forms completed Constipation. This is well-controlled with stool softeners only this will be continued high fiber diet more fluid intake also encouraged

## 2010-11-05 NOTE — Patient Instructions (Signed)
Limit your sodium (Salt) intake  You need to lose weight.  Consider a lower calorie diet and regular exercise.  Return in 6 months for follow-up   

## 2010-11-08 ENCOUNTER — Encounter: Payer: Self-pay | Admitting: Internal Medicine

## 2010-11-11 ENCOUNTER — Telehealth: Payer: Self-pay | Admitting: Internal Medicine

## 2010-11-11 NOTE — Telephone Encounter (Signed)
Proceed with orthopedic recommendations for proper surgery

## 2010-11-11 NOTE — Telephone Encounter (Signed)
Pt is having knee surgery and she read in the newspaper about laser treatment, for bone and bone,bursitis, arthiritis. Pt is wanting Dr Leanora Ivanoff opion on laser treatment?

## 2010-11-11 NOTE — Telephone Encounter (Signed)
Spoke with Evelyn Irwin- informed of dr. Vernon Prey reccomendation

## 2010-11-26 ENCOUNTER — Encounter (HOSPITAL_COMMUNITY)
Admission: RE | Admit: 2010-11-26 | Discharge: 2010-11-26 | Disposition: A | Discharge status: Home / Self Care | Payer: Medicare HMO | Source: Ambulatory Visit | Attending: Orthopedic Surgery | Admitting: Orthopedic Surgery

## 2010-11-26 ENCOUNTER — Ambulatory Visit (HOSPITAL_COMMUNITY)
Admission: RE | Admit: 2010-11-26 | Discharge: 2010-11-26 | Disposition: A | Discharge status: Home / Self Care | Payer: Medicare HMO | Source: Ambulatory Visit | Attending: Orthopedic Surgery | Admitting: Orthopedic Surgery

## 2010-11-26 ENCOUNTER — Other Ambulatory Visit (HOSPITAL_COMMUNITY): Payer: Self-pay | Admitting: Orthopedic Surgery

## 2010-11-26 DIAGNOSIS — M259 Joint disorder, unspecified: Secondary | ICD-10-CM

## 2010-11-26 DIAGNOSIS — Z01812 Encounter for preprocedural laboratory examination: Secondary | ICD-10-CM | POA: Insufficient documentation

## 2010-11-26 DIAGNOSIS — Z01818 Encounter for other preprocedural examination: Secondary | ICD-10-CM | POA: Insufficient documentation

## 2010-11-26 DIAGNOSIS — I10 Essential (primary) hypertension: Secondary | ICD-10-CM | POA: Insufficient documentation

## 2010-11-26 DIAGNOSIS — Z01810 Encounter for preprocedural cardiovascular examination: Secondary | ICD-10-CM | POA: Insufficient documentation

## 2010-11-26 DIAGNOSIS — M25869 Other specified joint disorders, unspecified knee: Secondary | ICD-10-CM | POA: Insufficient documentation

## 2010-11-26 LAB — SURGICAL PCR SCREEN
MRSA, PCR: NEGATIVE
Staphylococcus aureus: NEGATIVE

## 2010-11-26 LAB — CBC
Hemoglobin: 13.3 g/dL (ref 12.0–15.0)
MCH: 30.5 pg (ref 26.0–34.0)
RBC: 4.36 MIL/uL (ref 3.87–5.11)

## 2010-11-26 LAB — APTT: aPTT: 28 seconds (ref 24–37)

## 2010-11-26 LAB — BASIC METABOLIC PANEL
GFR calc Af Amer: >60 mL/min (ref >60)
GFR calc non Af Amer: >60 mL/min (ref >60)
Glucose, Bld: 106 mg/dL — ABNORMAL HIGH (ref 70–99)
Potassium: 3.3 mEq/L — ABNORMAL LOW (ref 3.5–5.1)
Sodium: 141 mEq/L (ref 135–145)

## 2010-11-26 LAB — PROTIME-INR: Prothrombin Time: 14 seconds (ref 11.6–15.2)

## 2010-11-28 LAB — HEPATIC FUNCTION PANEL
ALT: 19 U/L (ref 0–35)
Indirect Bilirubin: 0.3 mg/dL (ref 0.3–0.9)
Total Protein: 7.3 g/dL (ref 6.0–8.3)

## 2010-12-02 ENCOUNTER — Inpatient Hospital Stay (HOSPITAL_COMMUNITY)
Admission: RE | Admit: 2010-12-02 | Discharge: 2010-12-05 | DRG: 470 | Disposition: A | Discharge status: Home Health | Payer: Medicare HMO | Source: Ambulatory Visit | Attending: Orthopedic Surgery | Admitting: Orthopedic Surgery

## 2010-12-02 DIAGNOSIS — E876 Hypokalemia: Secondary | ICD-10-CM | POA: Diagnosis present

## 2010-12-02 DIAGNOSIS — J9819 Other pulmonary collapse: Secondary | ICD-10-CM | POA: Diagnosis not present

## 2010-12-02 DIAGNOSIS — D62 Acute posthemorrhagic anemia: Secondary | ICD-10-CM | POA: Diagnosis not present

## 2010-12-02 DIAGNOSIS — K5900 Constipation, unspecified: Secondary | ICD-10-CM | POA: Diagnosis present

## 2010-12-02 DIAGNOSIS — M1710 Unilateral primary osteoarthritis, unspecified knee: Principal | ICD-10-CM | POA: Diagnosis present

## 2010-12-02 DIAGNOSIS — I10 Essential (primary) hypertension: Secondary | ICD-10-CM | POA: Diagnosis present

## 2010-12-02 DIAGNOSIS — IMO0002 Reserved for concepts with insufficient information to code with codable children: Principal | ICD-10-CM | POA: Diagnosis present

## 2010-12-02 LAB — URINALYSIS, ROUTINE W REFLEX MICROSCOPIC
Bilirubin Urine: NEGATIVE
Glucose, UA: NEGATIVE mg/dL
Ketones, ur: NEGATIVE mg/dL
Leukocytes, UA: NEGATIVE
Nitrite: NEGATIVE
Specific Gravity, Urine: 1.006 (ref 1.005–1.030)
Urobilinogen, UA: 0.2 mg/dL (ref 0.0–1.0)
Urobilinogen, UA: 0.2 mg/dL (ref 0.0–1.0)

## 2010-12-02 LAB — TYPE AND SCREEN
ABO/RH(D): B POS
Antibody Screen: NEGATIVE

## 2010-12-02 LAB — URINE MICROSCOPIC-ADD ON

## 2010-12-02 LAB — ABO/RH: ABO/RH(D): B POS

## 2010-12-03 LAB — BASIC METABOLIC PANEL
GFR calc non Af Amer: 60 mL/min — ABNORMAL LOW (ref >60)
Glucose, Bld: 146 mg/dL — ABNORMAL HIGH (ref 70–99)
Potassium: 2.9 mEq/L — ABNORMAL LOW (ref 3.5–5.1)
Sodium: 138 mEq/L (ref 135–145)

## 2010-12-03 LAB — CARDIAC PANEL(CRET KIN+CKTOT+MB+TROPI)
CK, MB: 3.6 ng/mL (ref 0.3–4.0)
Total CK: 318 U/L — ABNORMAL HIGH (ref 7–177)
Troponin I: <0.3 ng/mL (ref <0.30)

## 2010-12-03 LAB — URINE CULTURE: Culture: NO GROWTH

## 2010-12-03 LAB — CBC
Hemoglobin: 10.4 g/dL — ABNORMAL LOW (ref 12.0–15.0)
MCHC: 33.5 g/dL (ref 30.0–36.0)
Platelets: 231 10*3/uL (ref 150–400)
RBC: 3.5 MIL/uL — ABNORMAL LOW (ref 3.87–5.11)

## 2010-12-04 LAB — CBC
HCT: 26.7 % — ABNORMAL LOW (ref 36.0–46.0)
Hemoglobin: 9.1 g/dL — ABNORMAL LOW (ref 12.0–15.0)
MCV: 88.4 fL (ref 78.0–100.0)
RBC: 3.02 MIL/uL — ABNORMAL LOW (ref 3.87–5.11)
WBC: 12.5 10*3/uL — ABNORMAL HIGH (ref 4.0–10.5)

## 2010-12-04 LAB — BASIC METABOLIC PANEL
CO2: 27 mEq/L (ref 19–32)
Chloride: 107 mEq/L (ref 96–112)
Creatinine, Ser: 0.69 mg/dL (ref 0.50–1.10)
GFR calc Af Amer: >60 mL/min (ref >60)
Potassium: 3.2 mEq/L — ABNORMAL LOW (ref 3.5–5.1)
Sodium: 140 mEq/L (ref 135–145)

## 2010-12-05 LAB — CBC
HCT: 25.9 % — ABNORMAL LOW (ref 36.0–46.0)
Hemoglobin: 8.8 g/dL — ABNORMAL LOW (ref 12.0–15.0)
MCH: 29.8 pg (ref 26.0–34.0)
MCHC: 34 g/dL (ref 30.0–36.0)
MCV: 87.8 fL (ref 78.0–100.0)
RBC: 2.95 MIL/uL — ABNORMAL LOW (ref 3.87–5.11)

## 2010-12-05 LAB — BASIC METABOLIC PANEL
BUN: 11 mg/dL (ref 6–23)
CO2: 25 mEq/L (ref 19–32)
Calcium: 8.7 mg/dL (ref 8.4–10.5)
GFR calc non Af Amer: >60 mL/min (ref >60)
Glucose, Bld: 113 mg/dL — ABNORMAL HIGH (ref 70–99)

## 2010-12-11 NOTE — Op Note (Signed)
Evelyn Irwin, Evelyn Irwin               ACCOUNT NO.:  [id number]  MEDICAL RECORD NO.:  [id number]  LOCATION:  5005                         FACILITY:  [facility]  PHYSICIAN:  Molly Maduro A. Thurston Hole, M.D. DATE OF BIRTH:  Jul 02, 1933  DATE OF PROCEDURE:  12/02/2010 DATE OF DISCHARGE:                              OPERATIVE REPORT   PREOPERATIVE DIAGNOSIS:  Right knee degenerative joint disease.  POSTOPERATIVE DIAGNOSIS:  Right knee degenerative joint disease.  PROCEDURE: 1. Right total knee replacement using DePuy cemented total knee system     with #3 cemented femur, #3 cemented tibia, with 10-mm polyethylene     RP tibial spacer, and 32-mm polyethylene cemented patella. 2. Zinacef-impregnated cement.  SURGEON:  Elana Alm. Thurston Hole, MD.  ASSISTANT:  Kirstin Shepperson, PA-C.  ANESTHESIA:  General.  OPERATIVE TIME:  1 hour 20 minutes.  COMPLICATIONS:  None.  DESCRIPTION OF PROCEDURE:  [name] was brought to the operating room on December 02, 2010 after a femoral nerve block was placed in the holding area by Anesthesia.  She was placed on the operating table in supine position.  After being placed under general anesthesia, she had a Foley catheter placed under sterile conditions.  She received Ancef 2 grams IV preoperatively for prophylaxis.  Her right knee was examined under anesthesia.  She had range of motion from -5 to 125 degrees, mild varus deformity, knee stable ligamentous exam with normal patellar tracking.  The right leg was prepped using sterile DuraPrep and draped using sterile technique.  Time-out procedure was called and the correct right knee identified.  The right leg was exsanguinated and a thigh tourniquet elevated at 375 mmHg.  Initially, through a 15-cm longitudinal incision based over the patella, initial exposure was made. The underlying subcutaneous tissues were incised along with skin incision.  A median arthrotomy was performed revealing an excessive amount of  normal-appearing joint fluid.  The articular surfaces were inspected.  She had grade 4 changes medially, grade 3 changes laterally, and grade 3 and 4 changes in the patellofemoral joint.  Osteophytes were removed from the femoral condyles and tibial plateau.  The medial and lateral meniscal remnants removed as well as the anterior cruciate ligament.  Intramedullary drill was then drilled up the femoral canal for placement of distal femoral cutting jig which was placed in the appropriate manner rotation and distal 10-mm cut was made.  Distal femur was incised.  A #3 was found be the appropriate size.  A #3 cutting jig was placed in the appropriate manner of external rotation and then these cuts were made.  The proximal tibia was then exposed.  The tibial spines were removed with an oscillating saw.  Intramedullary drill was drilled down the tibial canal for placement of proximal tibial cutting jig, which was placed in appropriate manner of rotation and a proximal 4-mm cut was made based off the medial or lateral lower side.  Spacer blocks were then placed in flexion and extension.  A 10-mm blocks gave excellent balancing and excellent stability and excellent correction of her flexion and varus deformities.  A #3 tibial baseplate trial was then placed on the cut tibial surface with an excellent fit and the  keel cut was made.  The PCL box cutter was then placed on the distal femur and these cuts were made.  After this was done, the #3 femoral trial was placed with a #3 tibial baseplate trial and a 10-mm polyethylene RP tibial spacer, knee was reduced, taken through range of motion from 0 to 125 degrees with excellent stability and excellent correction of her flexion and varus deformities and normal patellar tracking.  A resurfacing 8.5-mm cut was then made on the patella and 3 locking holes placed for a 32-mm polyethylene patellar trial and again patellofemoral tracking was evaluated and  found to be normal.  At this point, it was felt that all the trial components were excellent size and stability. They were then removed.  The knee was then jet lavage irrigated with 3 liters of saline.  Proximal tibia was then exposed.  A #3 tibial baseplate with Zinacef-impregnated cement backing was hammered into position with an excellent fit with excess cement being removed around the edges.  A #3 femoral component with cement backing was hammered into position also with an excellent fit with excess cement being removed around the edges.  The 10-mm polyethylene RP tibial spacer was placed on tibial baseplate.  The knee was then reduced, taken through range of motion from 0 to 125 degrees with excellent stability and excellent correction of her flexion and varus deformities.  The 32-mm polyethylene cement backed patella was then placed in its position and held there with a clamp.  After the cement hardened, again patellofemoral tracking was evaluated and found to be normal.  At this point, it was felt that all the components were excellent size and stability.  The knee was further irrigated with saline and then the tourniquet was released. Hemostasis obtained with cautery.  The arthrotomy was then closed with #1 Ethibond suture over two medium Hemovac drains.  Subcutaneous tissues closed with 0 and 2-0 Vicryl.  Subcuticular layer closed with 4-0 Monocryl.  Sterile dressings, long-leg splint applied.  The patient then awakened, extubated, and taken to recovery room in stable condition. Needle and sponge counts correct x2 at the end of the case. Neurovascular status normal.  Pulses 2+ and symmetric.     Evelyn Irwin A. Thurston Hole, M.D.     RAW/MEDQ  D:  12/02/2010  T:  12/03/2010  Job:  [id number]  Electronically Signed by Salvatore Marvel M.D. on 12/11/2010 11:27:32 AM

## 2011-01-14 ENCOUNTER — Telehealth: Payer: Self-pay | Admitting: Internal Medicine

## 2011-01-14 ENCOUNTER — Encounter: Payer: Medicare HMO | Admitting: Internal Medicine

## 2011-01-14 MED ORDER — BUSPIRONE HCL 15 MG PO TABS: 7.5000 mg | ORAL_TABLET | Freq: Every day | ORAL | Status: DC

## 2011-01-14 MED ORDER — TELMISARTAN 80 MG PO TABS: 80.0000 mg | ORAL_TABLET | Freq: Every day | ORAL | Status: DC

## 2011-01-14 MED ORDER — AMLODIPINE BESYLATE 10 MG PO TABS: 10.0000 mg | ORAL_TABLET | Freq: Every day | ORAL | Status: DC

## 2011-01-14 MED ORDER — POTASSIUM CHLORIDE 10 MEQ PO TBCR: 10.0000 meq | EXTENDED_RELEASE_TABLET | Freq: Two times a day (BID) | ORAL | Status: DC

## 2011-01-14 MED ORDER — HYDROCHLOROTHIAZIDE 25 MG PO TABS: 25.0000 mg | ORAL_TABLET | Freq: Every day | ORAL | Status: DC

## 2011-01-14 NOTE — Telephone Encounter (Signed)
Pt called and said that pharmacy has changed to [facility] on [address] foramLODipine (NORVASC) 10 MG tablet ,busPIRone (BUSPAR) 15 MG tablet,hydrochlorothiazide 25 MG tablet ,potassium chloride (KLOR-CON) 10 MEQ CR tablet,telmisartan (MICARDIS) 80 MG tablet. The pharmacies phone # is [phone].

## 2011-01-14 NOTE — Telephone Encounter (Signed)
All meds efiled to [facility] [place]

## 2011-02-04 NOTE — Discharge Summary (Signed)
NAMEWENDY, Evelyn Irwin               ACCOUNT NO.:  [id number]  MEDICAL RECORD NO.:  [id number]  LOCATION:                                 FACILITY:  PHYSICIAN:  Skyelyn Scruggs A. Thurston Hole, M.D. DATE OF BIRTH:  1934-02-11  DATE OF ADMISSION:  12/02/2010 DATE OF DISCHARGE:  12/05/2010                              DISCHARGE SUMMARY   ADMITTING DIAGNOSES:  End-stage degenerative joint disease, right knee, hypertension, hypokalemia, and constipation.  DISCHARGE DIAGNOSIS:  End-stage degenerative joint disease, right knee, hypokalemia, fever most likely secondary due to atelectasis, hypertension, constipation.  HISTORY OF PRESENT ILLNESS:  The patient is a 77 year old black female who has end-stage DJD of her right knee.  She has failed conservative care occluding anti-inflammatories and interarticular cortisone injections.  Risks, benefits, and possible complications of a right total knee replacement were discussed in detail with her.  She understands these and is without question.  PROCEDURES IN-HOUSE:  On December 02, 2010, the patient underwent a right total knee replacement by Dr. Thurston Hole, a right femoral nerve block by anesthesia and an Autovac transfusion.  She tolerated all of these well and was placed in a CPM unit in recovery.  Postop day #1, the patient had difficulty with pain control, now she is tolerating the pain medicine very well.  She is taking oxycodone for pain and is improved with this.  Postop day #1, the patient's potassium was 2.8.  This is down from 3.2 preop.  She was given 40 mEq p.o. b.i.d.  This did improve her potassium to 3.2.  Postop day #2, the patient took her normal 20 mEq potassium and her potassium drifted back down to 3.0.  Postop day #3, she was given 40 mEq of potassium p.o. b.i.d.  She was given Dulcolax suppository.  If there were no results with this, she was given half bottle of mag citrate to drink.  Her hemoglobin is stable with 8.8.  She is ambulating  60 feet.  She is very pleased with her independence.  She is tolerating her CPM 0-60 degrees.  Surgical wound is well- approximated.  She has a small amount of serosanguineous drainage and appears to have a moderate hemarthrosis.  She has no redness about her knee.  She has been running a fever.  She was 101 two days ago. Overnight she was 100.5.  Her white cell count is diminishing.  Her fever is most likely from atelectasis, however, we will start her on Keflex to prevent from this atelectasis to develop into pneumonia.  She is being discharged to home in stable condition, weightbearing as tolerated, and she will use her CPM 0-60 degrees 6 hours a day, increasing by 5 degrees a day.  She will use her yellow foam block to elevate under her heel when she is in bed and not in her CPM.  DISCHARGE MEDICATIONS: 1. Micardis 80 mg 1 tablet daily. 2. Lovenox 30 mg subcu b.i.d. for 2 weeks. 3. Buspirone 15 mg half a tablet a day. 4. Colace 100 mg 1 tablet daily. 5. Amlodipine 10 mg daily. 6. She has been instructed to stop her hydrocodone. 7. She will take Percocet 5/325, 1-2 tablets every 4-6  hours as needed     for pain. 8. Potassium 10 mEq.  Her new instructions are she will take 1 tablet     twice a day as she was taking it at home and hydrochlorothiazide 25     mg 1 tablet daily and vitamin B12 one tablet daily. 9. She will also be discharged on Keflex 500 mg 1 tablet 4 times a day  She has been instructed to call our office with increased pain, increased swelling, increased redness or a temperature greater than 101. We will see her back in the office on December 16, 2010 for stitches out and x-rays.  She will get home health physical therapy that is medically necessary because she is status post a total joint replacement with decreased mobility and decreased strength.  Home health is medically necessary because she is homebound because she is unable to lead the house without  assistance.     Kirstin Shepperson, PA-C   ______________________________ Elana Alm Thurston Hole, M.D.    KS/MEDQ  D:  12/05/2010  T:  12/05/2010  Job:  [id number]  Electronically Signed by Julien Girt P.A. on 01/22/2011 01:20:47 PM Electronically Signed by Salvatore Marvel M.D. on 02/04/2011 08:26:08 AM

## 2011-05-07 ENCOUNTER — Encounter: Payer: Self-pay | Admitting: Internal Medicine

## 2011-05-07 ENCOUNTER — Ambulatory Visit (INDEPENDENT_AMBULATORY_CARE_PROVIDER_SITE_OTHER): Payer: Medicare HMO | Admitting: Internal Medicine

## 2011-05-07 DIAGNOSIS — D649 Anemia, unspecified: Secondary | ICD-10-CM

## 2011-05-07 DIAGNOSIS — M1990 Unspecified osteoarthritis, unspecified site: Secondary | ICD-10-CM

## 2011-05-07 DIAGNOSIS — K219 Gastro-esophageal reflux disease without esophagitis: Secondary | ICD-10-CM

## 2011-05-07 DIAGNOSIS — J309 Allergic rhinitis, unspecified: Secondary | ICD-10-CM

## 2011-05-07 DIAGNOSIS — I10 Essential (primary) hypertension: Secondary | ICD-10-CM

## 2011-05-07 DIAGNOSIS — Z0000 Encounter for general adult medical examination without abnormal findings: Secondary | ICD-10-CM

## 2011-05-07 DIAGNOSIS — E785 Hyperlipidemia, unspecified: Secondary | ICD-10-CM

## 2011-05-07 LAB — CBC WITH DIFFERENTIAL/PLATELET
Basophils Absolute: 0 10*3/uL (ref 0.0–0.1)
Eosinophils Absolute: 0.1 10*3/uL (ref 0.0–0.7)
Hemoglobin: 12.2 g/dL (ref 12.0–15.0)
Lymphocytes Relative: 22 % (ref 12.0–46.0)
MCHC: 33 g/dL (ref 30.0–36.0)
Monocytes Relative: 5.1 % (ref 3.0–12.0)
Neutro Abs: 4.8 10*3/uL (ref 1.4–7.7)
Neutrophils Relative %: 71.4 % (ref 43.0–77.0)
RBC: 4 Mil/uL (ref 3.87–5.11)
RDW: 15.6 % — ABNORMAL HIGH (ref 11.5–14.6)

## 2011-05-07 LAB — LIPID PANEL
Cholesterol: 182 mg/dL (ref 0–200)
LDL Cholesterol: 101 mg/dL — ABNORMAL HIGH (ref 0–99)
Total CHOL/HDL Ratio: 3
Triglycerides: 70 mg/dL (ref 0.0–149.0)
VLDL: 14 mg/dL (ref 0.0–40.0)

## 2011-05-07 MED ORDER — TELMISARTAN 80 MG PO TABS: 80.0000 mg | ORAL_TABLET | Freq: Every day | ORAL | Status: DC

## 2011-05-07 MED ORDER — AMLODIPINE BESYLATE 10 MG PO TABS: 10.0000 mg | ORAL_TABLET | Freq: Every day | ORAL | Status: DC

## 2011-05-07 MED ORDER — BUSPIRONE HCL 15 MG PO TABS: 7.5000 mg | ORAL_TABLET | Freq: Every day | ORAL | Status: DC

## 2011-05-07 MED ORDER — HYDROCHLOROTHIAZIDE 25 MG PO TABS: 25.0000 mg | ORAL_TABLET | Freq: Every day | ORAL | Status: DC

## 2011-05-07 MED ORDER — POTASSIUM CHLORIDE 10 MEQ PO TBCR: 10.0000 meq | EXTENDED_RELEASE_TABLET | Freq: Two times a day (BID) | ORAL | Status: DC

## 2011-05-07 NOTE — Patient Instructions (Signed)
Limit your sodium (Salt) intake    It is important that you exercise regularly, at least 20 minutes 3 to 4 times per week.  If you develop chest pain or shortness of breath seek  medical attention.  Take a calcium supplement, plus [phone] units of vitamin D  Dermatology followup as scheduled  Return in 6 months for follow-up  Please check your blood pressure on a regular basis.  If it is consistently greater than 150/90, please make an office appointment.

## 2011-05-07 NOTE — Progress Notes (Signed)
Subjective:    Patient ID: Evelyn Irwin, female    DOB: 1933/11/08, 77 y.o.   MRN: [id number]  HPI  CC: cpx - doing well .   History of Present Illness:   77 year-old patient who is seen today for a comprehensive evaluation. Medical problems include treated hypertension. She has a history of osteoarthritis mainly affecting the knees. She is followed closely by orthopedics. She has anxiety disorder and gastroesophageal reflux disease.   Here for Medicare AWV:   1. Risk factors based on Past M, S, F history: patient has a history of treated hypertension. Family history of cerebrovascular disease  2. Physical Activities: limited due to bilateral knee pain  3. Depression/mood: history anxiety, but no history of depression  4. Hearing: no deficit  5. ADL's: independent in all aspects of daily living  6. Fall Risk: low  7. Home Safety: no problems identified  8. Height, weight, &visual acuity:height and weight stable. No difficulty with visual acuity  9. Counseling: follow-up gynecology next week  10. Labs ordered based on risk factors: laboratory profile, including TSH, and lipid profile will be reviewed  11. Referral Coordination- GYN and orthopedic referral  12. Care Plan- heart healthy diet, weight loss encouraged  13. Cognitive Assessment- alert and oriented, with normal affect   Preventive Screening-Counseling & Management  Alcohol-Tobacco  Smoking Status: never   Allergies (verified):  No Known Drug Allergies   Past History:  Past Medical History:  Reviewed history from 10/06/2007 and no changes required.  [id number]  Hosp age 77 for BP eval  Allergic rhinitis  Anxiety  Hypertension  Osteoarthritis  GERD   Past Surgical History:  Reviewed history from 10/06/2007 and no changes required.  melanoma removed from back  colonoscopy July 2005  2-D echocardiogram December 2004  stress thallium January 2005  Status post right total knee replacement therapy July  2012  Family History:  Reviewed history from 12/26/2008 and no changes required.  Family History Hypertension  Family History Other cancer: sister breast ca  Family History of Stroke F 1st degree relative <60  father died age 23, CVA  mother died age 70, CVA  Four brothers 3 sisters  Positive for CVD hypertension, breast cancer  one brother died of a stroke at age 74  sister died of breast cancer, age 27  one brother died of CVA   Social History:  Reviewed history from 10/06/2007 and no changes required.  Married  3 children, and 3 grandchildren     Review of Systems  Constitutional: Negative for fever, appetite change, fatigue and unexpected weight change.  HENT: Negative for hearing loss, ear pain, nosebleeds, congestion, sore throat, mouth sores, trouble swallowing, neck stiffness, dental problem, voice change, sinus pressure and tinnitus.   Eyes: Negative for photophobia, pain, redness and visual disturbance.  Respiratory: Negative for cough, chest tightness and shortness of breath.   Cardiovascular: Negative for chest pain, palpitations and leg swelling.  Gastrointestinal: Negative for nausea, vomiting, abdominal pain, diarrhea, constipation, blood in stool, abdominal distention and rectal pain.  Genitourinary: Negative for dysuria, urgency, frequency, hematuria, flank pain, vaginal bleeding, vaginal discharge, difficulty urinating, genital sores, vaginal pain, menstrual problem and pelvic pain.  Musculoskeletal: Negative for back pain and arthralgias.  Skin: Positive for color change. Negative for rash.       Dysplastic pigmented lesion right lower leg. Scheduled  for dermatology evaluation  Neurological: Negative for dizziness, syncope, speech difficulty, weakness, light-headedness, numbness and headaches.  Hematological: Negative for adenopathy. Does  not bruise/bleed easily.  Psychiatric/Behavioral: Negative for suicidal ideas, behavioral problems, self-injury, dysphoric  mood and agitation. The patient is not nervous/anxious.        Objective:   Physical Exam  Constitutional: She is oriented to person, place, and time. She appears well-developed and well-nourished.  HENT:  Head: Normocephalic and atraumatic.  Right Ear: External ear normal.  Left Ear: External ear normal.  Mouth/Throat: Oropharynx is clear and moist.  Eyes: Conjunctivae and EOM are normal.  Neck: Normal range of motion. Neck supple. No JVD present. No thyromegaly present.  Cardiovascular: Normal rate, regular rhythm, normal heart sounds and intact distal pulses.   No murmur heard. Pulmonary/Chest: Effort normal and breath sounds normal. She has no wheezes. She has no rales.  Abdominal: Soft. Bowel sounds are normal. She exhibits no distension and no mass. There is no tenderness. There is no rebound and no guarding.  Musculoskeletal: Normal range of motion. She exhibits no edema and no tenderness.  Neurological: She is alert and oriented to person, place, and time. She has normal reflexes. No cranial nerve deficit. She exhibits normal muscle tone. Coordination normal.  Skin: Skin is warm and dry. Rash noted.       7 x 9 mm pigmented lesion right lower leg with irregular borders  Psychiatric: She has a normal mood and affect. Her behavior is normal.          Assessment & Plan:   Hypertension well controlled Preventive health examination Dysplastic lesion right lower leg history melanoma. Orthopedics has already scheduled a dermatology referral Osteoarthritis  We'll recheck in 6 months Dermatology followup as scheduled Followup orthopedics as scheduled  We'll review lab update

## 2011-08-12 ENCOUNTER — Encounter: Payer: Self-pay | Admitting: Internal Medicine

## 2011-08-13 LAB — HM MAMMOGRAPHY

## 2011-08-15 ENCOUNTER — Encounter: Payer: Self-pay | Admitting: Internal Medicine

## 2011-10-01 ENCOUNTER — Other Ambulatory Visit: Payer: Self-pay | Admitting: Internal Medicine

## 2011-11-05 ENCOUNTER — Encounter: Payer: Self-pay | Admitting: Internal Medicine

## 2011-11-05 ENCOUNTER — Ambulatory Visit (INDEPENDENT_AMBULATORY_CARE_PROVIDER_SITE_OTHER): Payer: Medicare HMO | Admitting: Internal Medicine

## 2011-11-05 VITALS — BP 112/80 | Temp 98.7°F | Wt 189.0 lb

## 2011-11-05 DIAGNOSIS — I10 Essential (primary) hypertension: Secondary | ICD-10-CM

## 2011-11-05 DIAGNOSIS — M1990 Unspecified osteoarthritis, unspecified site: Secondary | ICD-10-CM

## 2011-11-05 MED ORDER — HYDROCHLOROTHIAZIDE 25 MG PO TABS: 25.0000 mg | ORAL_TABLET | Freq: Every day | ORAL | Status: DC

## 2011-11-05 MED ORDER — BUSPIRONE HCL 15 MG PO TABS: 7.5000 mg | ORAL_TABLET | Freq: Every day | ORAL | Status: DC

## 2011-11-05 MED ORDER — TELMISARTAN 80 MG PO TABS: 80.0000 mg | ORAL_TABLET | Freq: Every day | ORAL | Status: DC

## 2011-11-05 MED ORDER — POTASSIUM CHLORIDE ER 10 MEQ PO TBCR: 10.0000 meq | EXTENDED_RELEASE_TABLET | Freq: Two times a day (BID) | ORAL | Status: DC

## 2011-11-05 MED ORDER — AMLODIPINE BESYLATE 10 MG PO TABS: 10.0000 mg | ORAL_TABLET | Freq: Every day | ORAL | Status: DC

## 2011-11-05 NOTE — Progress Notes (Signed)
  Subjective:    Patient ID: Evelyn Irwin, female    DOB: 03/09/1934, 78 y.o.   MRN: [id number]  HPI  78 year old patient who is seen today for followup of hypertension. She has done quite well. Her only concern is a lesion involving her right lower leg at the site of the prior resection of an apparent nevus by dermatology. She has developed some apparent posttraumatic hyperpigmentation    Review of Systems  Constitutional: Negative.   HENT: Negative for hearing loss, congestion, sore throat, rhinorrhea, dental problem, sinus pressure and tinnitus.   Eyes: Negative for pain, discharge and visual disturbance.  Respiratory: Negative for cough and shortness of breath.   Cardiovascular: Negative for chest pain, palpitations and leg swelling.  Gastrointestinal: Negative for nausea, vomiting, abdominal pain, diarrhea, constipation, blood in stool and abdominal distention.  Genitourinary: Negative for dysuria, urgency, frequency, hematuria, flank pain, vaginal bleeding, vaginal discharge, difficulty urinating, vaginal pain and pelvic pain.  Musculoskeletal: Negative for joint swelling, arthralgias and gait problem.  Skin: Positive for rash.  Neurological: Negative for dizziness, syncope, speech difficulty, weakness, numbness and headaches.  Hematological: Negative for adenopathy.  Psychiatric/Behavioral: Negative for behavioral problems, dysphoric mood and agitation. The patient is not nervous/anxious.        Objective:   Physical Exam  Constitutional: She is oriented to person, place, and time. She appears well-developed and well-nourished.  HENT:  Head: Normocephalic.  Right Ear: External ear normal.  Left Ear: External ear normal.  Mouth/Throat: Oropharynx is clear and moist.  Eyes: Conjunctivae and EOM are normal. Pupils are equal, round, and reactive to light.  Neck: Normal range of motion. Neck supple. No thyromegaly present.  Cardiovascular: Normal rate, regular rhythm, normal heart  sounds and intact distal pulses.   Pulmonary/Chest: Effort normal and breath sounds normal.  Abdominal: Soft. Bowel sounds are normal. She exhibits no mass. There is no tenderness.  Musculoskeletal: Normal range of motion.  Lymphadenopathy:    She has no cervical adenopathy.  Neurological: She is alert and oriented to person, place, and time.  Skin: Skin is warm and dry. Rash noted.       Proximal and 1.5 cm area of hyperpigmentation involving her right anterior lower leg  Psychiatric: She has a normal mood and affect. Her behavior is normal.          Assessment & Plan:  Hypertension well controlled osteoarthritis stable history of allergic rhinitis stable  Followup dermatology Medications refilled CPX 6 months

## 2011-11-05 NOTE — Patient Instructions (Signed)
Limit your sodium (Salt) intake    It is important that you exercise regularly, at least 20 minutes 3 to 4 times per week.  If you develop chest pain or shortness of breath seek  medical attention.  Please check your blood pressure on a regular basis.  If it is consistently greater than 150/90, please make an office appointment.  You need to lose weight.  Consider a lower calorie diet and regular exercise.  Return in 6 months for follow-up  Followup dermatology

## 2012-01-23 ENCOUNTER — Observation Stay (HOSPITAL_COMMUNITY): Payer: Medicare HMO

## 2012-01-23 ENCOUNTER — Observation Stay (HOSPITAL_COMMUNITY)
Admission: EM | Admit: 2012-01-23 | Discharge: 2012-01-24 | Disposition: A | Discharge status: Home / Self Care | Payer: Medicare HMO | Attending: Internal Medicine | Admitting: Internal Medicine

## 2012-01-23 ENCOUNTER — Encounter (HOSPITAL_COMMUNITY): Payer: Self-pay | Admitting: Emergency Medicine

## 2012-01-23 ENCOUNTER — Emergency Department (HOSPITAL_COMMUNITY): Payer: Medicare HMO

## 2012-01-23 DIAGNOSIS — F411 Generalized anxiety disorder: Secondary | ICD-10-CM | POA: Diagnosis present

## 2012-01-23 DIAGNOSIS — K219 Gastro-esophageal reflux disease without esophagitis: Secondary | ICD-10-CM

## 2012-01-23 DIAGNOSIS — R002 Palpitations: Secondary | ICD-10-CM | POA: Diagnosis present

## 2012-01-23 DIAGNOSIS — R55 Syncope and collapse: Principal | ICD-10-CM | POA: Insufficient documentation

## 2012-01-23 DIAGNOSIS — E876 Hypokalemia: Secondary | ICD-10-CM | POA: Diagnosis present

## 2012-01-23 DIAGNOSIS — I10 Essential (primary) hypertension: Secondary | ICD-10-CM | POA: Insufficient documentation

## 2012-01-23 DIAGNOSIS — I379 Nonrheumatic pulmonary valve disorder, unspecified: Secondary | ICD-10-CM

## 2012-01-23 LAB — CARDIAC PANEL(CRET KIN+CKTOT+MB+TROPI)
Relative Index: 1.5 (ref 0.0–2.5)
Total CK: 249 U/L — ABNORMAL HIGH (ref 7–177)
Troponin I: <0.3 ng/mL (ref <0.30)

## 2012-01-23 LAB — CBC WITH DIFFERENTIAL/PLATELET
Basophils Absolute: 0 10*3/uL (ref 0.0–0.1)
Basophils Relative: 0 % (ref 0–1)
Eosinophils Absolute: 0.1 10*3/uL (ref 0.0–0.7)
Hemoglobin: 13.6 g/dL (ref 12.0–15.0)
MCH: 30.2 pg (ref 26.0–34.0)
MCHC: 33.6 g/dL (ref 30.0–36.0)
Monocytes Absolute: 0.3 10*3/uL (ref 0.1–1.0)
Monocytes Relative: 4 % (ref 3–12)
Neutro Abs: 7.1 10*3/uL (ref 1.7–7.7)
Neutrophils Relative %: 79 % — ABNORMAL HIGH (ref 43–77)
RDW: 14.4 % (ref 11.5–15.5)

## 2012-01-23 LAB — URINE MICROSCOPIC-ADD ON

## 2012-01-23 LAB — BASIC METABOLIC PANEL
BUN: 16 mg/dL (ref 6–23)
Chloride: 102 mEq/L (ref 96–112)
Creatinine, Ser: 0.83 mg/dL (ref 0.50–1.10)
GFR calc Af Amer: 77 mL/min — ABNORMAL LOW (ref >90)
GFR calc non Af Amer: 67 mL/min — ABNORMAL LOW (ref >90)
Potassium: 3.1 mEq/L — ABNORMAL LOW (ref 3.5–5.1)

## 2012-01-23 LAB — URINALYSIS, ROUTINE W REFLEX MICROSCOPIC
Bilirubin Urine: NEGATIVE
Ketones, ur: NEGATIVE mg/dL
Leukocytes, UA: NEGATIVE
Nitrite: NEGATIVE
Urobilinogen, UA: 0.2 mg/dL (ref 0.0–1.0)

## 2012-01-23 LAB — MAGNESIUM: Magnesium: 2.1 mg/dL (ref 1.5–2.5)

## 2012-01-23 LAB — PRO B NATRIURETIC PEPTIDE: Pro B Natriuretic peptide (BNP): 182.3 pg/mL (ref 0–450)

## 2012-01-23 LAB — TSH: TSH: 2.664 u[IU]/mL (ref 0.350–4.500)

## 2012-01-23 MED ORDER — ONDANSETRON HCL 4 MG/2ML IJ SOLN: 4.0000 mg | Freq: Four times a day (QID) | INTRAMUSCULAR | Status: DC | PRN

## 2012-01-23 MED ORDER — ACETAMINOPHEN 650 MG RE SUPP: 650.0000 mg | Freq: Four times a day (QID) | RECTAL | Status: DC | PRN

## 2012-01-23 MED ORDER — ONDANSETRON HCL 4 MG PO TABS: 4.0000 mg | ORAL_TABLET | Freq: Four times a day (QID) | ORAL | Status: DC | PRN

## 2012-01-23 MED ORDER — BUSPIRONE HCL 15 MG PO TABS
7.5000 mg | ORAL_TABLET | Freq: Every day | ORAL | Status: DC
  Administered 2012-01-23 – 2012-01-24 (×2): 7.5 mg via ORAL
  Filled 2012-01-23 (×2): qty 1

## 2012-01-23 MED ORDER — ACETAMINOPHEN 325 MG PO TABS: 650.0000 mg | ORAL_TABLET | Freq: Four times a day (QID) | ORAL | Status: DC | PRN

## 2012-01-23 MED ORDER — POTASSIUM CHLORIDE CRYS ER 20 MEQ PO TBCR
40.0000 meq | EXTENDED_RELEASE_TABLET | Freq: Once | ORAL | Status: AC
  Administered 2012-01-23: 40 meq via ORAL
  Filled 2012-01-23: qty 2

## 2012-01-23 MED ORDER — ASPIRIN 325 MG PO TABS
325.0000 mg | ORAL_TABLET | Freq: Once | ORAL | Status: AC
  Administered 2012-01-23: 325 mg via ORAL
  Filled 2012-01-23: qty 1

## 2012-01-23 MED ORDER — SODIUM CHLORIDE 0.9 % IJ SOLN
3.0000 mL | Freq: Two times a day (BID) | INTRAMUSCULAR | Status: DC
  Administered 2012-01-23 – 2012-01-24 (×3): 3 mL via INTRAVENOUS

## 2012-01-23 NOTE — ED Notes (Signed)
Called 3W to give report.  Receiving RN unable to take report at this time. 

## 2012-01-23 NOTE — Progress Notes (Signed)
  Echocardiogram 2D Echocardiogram has been performed.  Cathie Beams 01/23/2012, 3:16 PM

## 2012-01-23 NOTE — Progress Notes (Signed)
UR Completed Adewale Pucillo Graves-Bigelow, RN,BSN [phone]  

## 2012-01-23 NOTE — H&P (Signed)
[facility] History and Physical  Evelyn Irwin ZOX:[id number] DOB: 1933/08/01 DOA: 01/23/2012   PCP: Rogelia Boga, MD   Chief Complaint: passed out today  HPI:  78 year old lady with h/o Hypertension, GERD, came in to ED after an episode of syncope, this morning. As per the patient, patient woke up and was fixing her breakfast and the next thing she knew she was on the floor. Her husband was at her side. She doesn't know how long she was unconscious. She denies any dizziness, chest pain or shortness of breath, nuasea or vomiting. She was brought to the ED by her husband. She denies any weakness, tingling or numbness of the extremities.   Review of Systems:   Constitutional: Denies fever, chills, diaphoresis, appetite change and fatigue.  HEENT: Denies photophobia, eye pain, redness, hearing loss, ear pain, congestion, sore throat, rhinorrhea, sneezing, mouth sores, trouble swallowing, neck pain, neck stiffness and tinnitus.  Respiratory: Denies SOB, DOE, cough, chest tightness, and wheezing.  Cardiovascular: Denies chest pain, palpitations and leg swelling.  Gastrointestinal: Denies nausea, vomiting, abdominal pain, diarrhea, constipation, blood in stool and abdominal distention.  Genitourinary: Denies dysuria, urgency, frequency, hematuria, flank pain and difficulty urinating.  Musculoskeletal: Denies myalgias, back pain, joint swelling, arthralgias and gait problem.  Skin: Denies pallor, rash and wound.  Neurological: Denies dizziness, seizures, syncope, weakness, light-headedness, numbness and headaches.  Hematological: Denies adenopathy. Easy bruising, personal or family bleeding history  Psychiatric/Behavioral: Denies suicidal ideation, mood changes, confusion, nervousness, sleep disturbance and agitation  Past Medical History  Diagnosis Date  . ALLERGIC RHINITIS 11/11/2006  . ANXIETY 11/11/2006  . CONSTIPATION 02/23/2008  . GERD 10/06/2007  . HYPERTENSION 11/11/2006    . HYPOKALEMIA, HX OF 02/09/2008  . OSTEOARTHRITIS 11/11/2006   History reviewed. No pertinent past surgical history. Social History:  reports that she has never smoked. She has never used smokeless tobacco. She reports that she does not drink alcohol or use illicit drugs.  No Known Allergies  History reviewed. No pertinent family history.   Prior to Admission medications   Medication Sig Start Date End Date Taking? Authorizing Provider  amLODipine (NORVASC) 10 MG tablet Take 1 tablet (10 mg total) by mouth daily. 11/05/11  Yes Gordy Savers, MD  APPLE CIDER VINEGAR PO Take 15 mLs by mouth once. With water   Yes Historical Provider, MD  busPIRone (BUSPAR) 15 MG tablet Take 0.5 tablets (7.5 mg total) by mouth daily. 11/05/11  Yes Gordy Savers, MD  Cholecalciferol (VITAMIN D PO) Take 1 tablet by mouth daily.   Yes Historical Provider, MD  hydrochlorothiazide (HYDRODIURIL) 25 MG tablet Take 1 tablet (25 mg total) by mouth daily. 11/05/11  Yes Gordy Savers, MD  Multiple Vitamins-Minerals (MULTIVITAMIN WITH MINERALS) tablet Take 1 tablet by mouth daily.   Yes Historical Provider, MD  potassium chloride (KLOR-CON 10) 10 MEQ tablet Take 1 tablet (10 mEq total) by mouth 2 (two) times daily. 11/05/11  Yes Gordy Savers, MD  telmisartan (MICARDIS) 80 MG tablet Take 1 tablet (80 mg total) by mouth daily. 11/05/11  Yes Gordy Savers, MD   Physical Exam: Filed Vitals:   01/23/12 0825 01/23/12 0945 01/23/12 1031 01/23/12 1200  BP: 210/86 148/85 150/68 142/71  Pulse: 84 78 79 74  Temp: 97.8 F (36.6 C)     TempSrc: Oral     Resp: 18 19 18 19   SpO2: 99% 100% 99% 99%    Constitutional: Vital signs reviewed.  Patient is a well-developed  and well-nourished  in no acute distress and cooperative with exam. Alert and oriented x3.  Head: Normocephalic and atraumatic Ear: TM normal bilaterally Mouth: no erythema or exudates, MMM Eyes: PERRL, EOMI, conjunctivae normal, No scleral  icterus.  Neck: Supple, Trachea midline normal ROM, No JVD, mass, thyromegaly, or carotid bruit present.  Cardiovascular: RRR, S1 normal, S2 normal, no MRG, pulses symmetric and intact bilaterally Pulmonary/Chest: CTAB, no wheezes, rales, or rhonchi Abdominal: Soft. Non-tender, non-distended, bowel sounds are normal, no masses, organomegaly, or guarding present.  GU: no CVA tenderness Musculoskeletal: No joint deformities, erythema, or stiffness, ROM full and no nontender Hematology: no cervical, inginal, or axillary adenopathy.  Neurological: A&O x3, Strength is normal and symmetric bilaterally, cranial nerve II-XII are grossly intact, no focal motor deficit, sensory intact to light touch bilaterally.  Skin: Warm, dry and intact. No rash, cyanosis, or clubbing.  Psychiatric: Normal mood and affect. speech and behavior is normal. Judgment and thought content normal. Cognition and memory are normal.     Labs on Admission:  Basic Metabolic Panel:  Lab 01/23/12 1610  NA 144  K 3.1*  CL 102  CO2 32  GLUCOSE 120*  BUN 16  CREATININE 0.83  CALCIUM 9.9  MG --  PHOS --   Liver Function Tests: No results found for this basename: AST:5,ALT:5,ALKPHOS:5,BILITOT:5,PROT:5,ALBUMIN:5 in the last 168 hours No results found for this basename: LIPASE:5,AMYLASE:5 in the last 168 hours No results found for this basename: AMMONIA:5 in the last 168 hours CBC:  Lab 01/23/12 0922  WBC 9.1  NEUTROABS 7.1  HGB 13.6  HCT 40.5  MCV 90.0  PLT 270   Cardiac Enzymes: No results found for this basename: CKTOTAL:5,CKMB:5,CKMBINDEX:5,TROPONINI:5 in the last 168 hours  BNP (last 3 results) No results found for this basename: PROBNP:3 in the last 8760 hours CBG:  Lab 01/23/12 0938  GLUCAP 102*    Radiological Exams on Admission: Dg Chest 2 View  01/23/2012  *RADIOLOGY REPORT*  Clinical Data: Syncope.  History of hypertension.  CHEST - 2 VIEW  Comparison: 03/18/2006 and 11/26/2010.  Findings: The  heart size and mediastinal contours are normal. The lungs are clear. There is no pleural effusion or pneumothorax. No acute osseous findings are identified.  Thoracic spine degenerative changes are stable.  IMPRESSION: Stable examination.  No active cardiopulmonary process.   Original Report Authenticated By: Gerrianne Scale, M.D.     EKG: no st t wave changes. NSR.  Assessment/Plan Principal Problem:  *Syncope and collapse Active Problems:  ANXIETY  HYPERTENSION  GERD  Acute hypokalemia  Palpitations  1. Syncope and collapse: -possibly vaso vagal. -admit to tele metry - aspirin - CT HEAD without contrast negative - echo and cardiac enzymes pending. - carotid duplex pending.  - orthostatics on admission.  2. Hypertension: suboptimal. Will restart antihypertensives in am.   3. GERD : resume protonix.   dvt prophylaxis  -   Code Status: full code Family Communication: none at bedside Disposition Plan: pending.  Time spent: 45 min  Makya Yurko [facility] Pager [phone]  If 7PM-7AM, please contact night-coverage [email or web address] Password [email or web address] 01/23/2012, 1:16 PM

## 2012-01-23 NOTE — ED Notes (Signed)
Report received, assumed care.  

## 2012-01-23 NOTE — ED Provider Notes (Signed)
Patient had syncopal event while standing preparing breakfast this morning. She felt palpitations upon awakening this morning she is presently asymptomatic, no treatment prior to coming here patient alert Glasgow Coma Score 15 no distress lungs clear to auscultation heart regular rate and rhythm  Doug Sou, MD 01/23/12 [phone]

## 2012-01-23 NOTE — Progress Notes (Signed)
*  PRELIMINARY RESULTS* Vascular Ultrasound Carotid Duplex (Doppler) has been completed.  Preliminary findings: Bilaterally no significant ICA stenosis with antegrade vertebral flow.  Farrel Demark, RDMS, RVT  01/23/2012, 3:35 PM

## 2012-01-23 NOTE — Care Management Note (Unsigned)
    Page 1 of 1   01/23/2012     5:02:33 PM   CARE MANAGEMENT NOTE 01/23/2012  Patient:  Evelyn Irwin, Evelyn Irwin   Account Number:  [id number]  Date Initiated:  01/23/2012  Documentation initiated by:  GRAVES-BIGELOW,Aydin Hink  Subjective/Objective Assessment:   Pt admitted with syncope. Possible d/c over the weekend.     Action/Plan:   Cm will continue to f/u for disposition needs.   Anticipated DC Date:  01/24/2012   Anticipated DC Plan:  HOME/SELF CARE      DC Planning Services  CM consult      Choice offered to / List presented to:             Status of service:  In process, will continue to follow Medicare Important Message given?   (If response is "NO", the following Medicare IM given date fields will be blank) Date Medicare IM given:   Date Additional Medicare IM given:    Discharge Disposition:    Per UR Regulation:  Reviewed for med. necessity/level of care/duration of stay  If discussed at Long Length of Stay Meetings, dates discussed:    Comments:

## 2012-01-23 NOTE — ED Provider Notes (Signed)
Medical screening examination/treatment/procedure(s) were conducted as a shared visit with non-physician practitioner(s) and myself.  I personally evaluated the patient during the encounter  Doug Sou, MD 01/23/12 1735

## 2012-01-23 NOTE — ED Notes (Signed)
Pt with 3 syncopal episodes today; one witnessed by RN last 5-10 second; strong pulse throughout event; pt alert after event; pt sts some dizziness prior to event; pt sts woke up on floor after first event; second and third in ED; pt hypertensive

## 2012-01-23 NOTE — ED Notes (Signed)
Patient states that she passed out this AM.  States that she walked into her kitchen and that is the last she remembers.  States that she woke up on the floor and that she had her senses. She called to her husband who sat her in a chair. States that she passed out a second time at the table. Denies and sensation other than knowing that she was going to pass out.

## 2012-01-23 NOTE — ED Provider Notes (Signed)
History     CSN: [id number]  Arrival date & time 01/23/12  3086   First MD Initiated Contact with Patient 01/23/12 [phone]      Chief Complaint  Patient presents with  . Loss of Consciousness    (Consider location/radiation/quality/duration/timing/severity/associated sxs/prior treatment) HPI Comments: Patient presents today with syncopal episode this morning. She reports standing while making breakfast and then waking up on the floor. She is unsure how long she was unconscious. She reports a fluttering in her chest upon awakening this morning. She denies head trauma, numbness/tingling, current pain. She denies chest pain, SOB, NVD, diaphoresis. She has a medical history of hypertension. She has no medical history of MI, stroke, diabetes.    Past Medical History  Diagnosis Date  . ALLERGIC RHINITIS 11/11/2006  . ANXIETY 11/11/2006  . CONSTIPATION 02/23/2008  . GERD 10/06/2007  . HYPERTENSION 11/11/2006  . HYPOKALEMIA, HX OF 02/09/2008  . OSTEOARTHRITIS 11/11/2006    History reviewed. No pertinent past surgical history.  History reviewed. No pertinent family history.  History  Substance Use Topics  . Smoking status: Never Smoker   . Smokeless tobacco: Never Used  . Alcohol Use: No    OB History    Grav Para Term Preterm Abortions TAB SAB Ect Mult Living                  Review of Systems  Constitutional: Negative for fever, chills, diaphoresis and fatigue.  HENT: Negative for trouble swallowing, neck pain and neck stiffness.   Eyes: Negative for photophobia and visual disturbance.  Respiratory: Negative for cough and shortness of breath.   Cardiovascular: Positive for palpitations. Negative for chest pain.  Gastrointestinal: Negative for nausea, vomiting, abdominal pain and diarrhea.  Genitourinary: Negative for dysuria.  Musculoskeletal: Negative for back pain and joint swelling.  Skin: Negative for wound.  Neurological: Negative for dizziness, weakness, light-headedness,  numbness and headaches.    Allergies  Review of patient's allergies indicates no known allergies.  Home Medications   Current Outpatient Rx  Name Route Sig Dispense Refill  . AMLODIPINE BESYLATE 10 MG PO TABS Oral Take 1 tablet (10 mg total) by mouth daily. 90 tablet 2  . APPLE CIDER VINEGAR PO Oral Take 15 mLs by mouth once. With water    . BUSPIRONE HCL 15 MG PO TABS Oral Take 0.5 tablets (7.5 mg total) by mouth daily. 90 tablet 2  . VITAMIN D PO Oral Take 1 tablet by mouth daily.    Marland Kitchen HYDROCHLOROTHIAZIDE 25 MG PO TABS Oral Take 1 tablet (25 mg total) by mouth daily. 90 tablet 1  . MULTI-VITAMIN/MINERALS PO TABS Oral Take 1 tablet by mouth daily.    Marland Kitchen POTASSIUM CHLORIDE ER 10 MEQ PO TBCR Oral Take 1 tablet (10 mEq total) by mouth 2 (two) times daily. 180 tablet 6  . TELMISARTAN 80 MG PO TABS Oral Take 1 tablet (80 mg total) by mouth daily. 90 tablet 2    BP 210/86  Pulse 84  Temp 97.8 F (36.6 C) (Oral)  Resp 18  SpO2 99%  Physical Exam  Nursing note and vitals reviewed. Constitutional: She is oriented to person, place, and time. She appears well-developed and well-nourished. No distress.  HENT:  Head: Normocephalic and atraumatic.  Mouth/Throat: No oropharyngeal exudate.  Eyes: Conjunctivae are normal. Pupils are equal, round, and reactive to light. No scleral icterus.  Neck: Normal range of motion. Neck supple.  Cardiovascular: Normal rate and regular rhythm.  Exam reveals no gallop  and no friction rub.   No murmur heard.      Multiple PVCs noted on monitor during exam.   Pulmonary/Chest: Effort normal and breath sounds normal. No respiratory distress. She has no wheezes. She has no rales.  Abdominal: Soft. There is no tenderness.  Musculoskeletal: Normal range of motion. She exhibits no edema and no tenderness.  Neurological: She is alert and oriented to person, place, and time. No cranial nerve deficit.       Extremity strength and sensation equal and intact  bilaterally.   Skin: Skin is warm and dry. She is not diaphoretic.  Psychiatric: She has a normal mood and affect. Her behavior is normal.    ED Course  Procedures (including critical care time)   Date: 01/23/2012  Rate: 77  Rhythm: normal sinus rhythm  QRS Axis: normal  Intervals: QT prolonged  ST/T Wave abnormalities: normal  Conduction Disutrbances:none  Narrative Interpretation: normal sinus rhythm, relatively unchanged from previous  Old EKG Reviewed: unchanged    Labs Reviewed  CBC WITH DIFFERENTIAL - Abnormal; Notable for the following:    Neutrophils Relative 79 (*)     All other components within normal limits  BASIC METABOLIC PANEL - Abnormal; Notable for the following:    Potassium 3.1 (*)     Glucose, Bld 120 (*)     GFR calc non Af Amer 67 (*)     GFR calc Af Amer 77 (*)     All other components within normal limits  GLUCOSE, CAPILLARY - Abnormal; Notable for the following:    Glucose-Capillary 102 (*)     All other components within normal limits  POCT I-STAT TROPONIN I  URINALYSIS, ROUTINE W REFLEX MICROSCOPIC   No results found.   No diagnosis found.    MDM  9:51 AM Patient resting comfortably with no current complaints. I have ordered labs, which show no concerning abnormalities. I am suspicious for cardiac dysrhythmia based on transient syncopal episodes with no resultant neurologic deficit and no chronic health conditions besides hypertension.   10:32 AM Patient will be admitted with telemetry for possible cardiac dysrhythmia. Dr. Ethelda Chick will admit her to medicine team 10.       Emilia Beck, PA-C 01/23/12 1119

## 2012-01-23 NOTE — Progress Notes (Addendum)
Disposition Note  Evelyn Irwin, is a 78 y.o. female,   MRN: [id number]  -  DOB - 08-Mar-1934  Outpatient Primary MD for the patient is Rogelia Boga, MD   Blood pressure 150/68, pulse 79, temperature 97.8 F (36.6 C), temperature source Oral, resp. rate 18, SpO2 99.00%.  Principal Problem:  *Syncope and collapse Active Problems:  Palpitations  HYPERTENSION  Acute hypokalemia  ANXIETY  GERD     Older female who presented to the emergency department after having a syncopal episode with apparent loss of consciousness and collapse. She reported to the emergency department she was standing while making breakfast and the next thing she remembered she was awakening on the floor. She was unsure how long she was unconscious. She does describe a subjective sensation of palpitations prior to the onset of this symptomatology. In the emergency department an EKG was performed that showed no evidence of tachycardia arrhythmia or ischemic changes. The QTC was borderline prolonged at 478 ms. The emergency department physician assistant did note some episodic PACs and PVCs with this patient on the bedside telemetry. Her potassium was noted to be slightly low at 3.1 and it is observed that this patient is on HydroDIURIL at home. In review of her pre-admission problem list he has a history of hypokalemia in the past. Patient was mildly hypertensive at presentation and orthostatic vital signs have not been checked. I discussed the patient's case with Dr. Rennis Chris the emergency room physician. I requested a cardiac telemetry observation bed. I requested he entered temporary admitting orders. Also asked that he place orders for echocardiogram with contrast and bilateral carotid Dopplers since the weekend is upcoming and would like to avoid unnecessary delay in obtaining these studies. A two-view chest x-ray is pending but it is noted this patient is not hypoxic and not complaining of any respiratory  symptoms. Deferred to the admitting physician whether or CT of the head is indicated at this point since patient apparently does not have any focal neurological symptomatology. I have also notified the flow manager office of bed request. Please note I have not interviewed or examined this patient.  Junious Silk, ANP

## 2012-01-24 LAB — COMPREHENSIVE METABOLIC PANEL
AST: 22 U/L (ref 0–37)
Albumin: 3.5 g/dL (ref 3.5–5.2)
Chloride: 103 mEq/L (ref 96–112)
Creatinine, Ser: 0.81 mg/dL (ref 0.50–1.10)
Potassium: 3.3 mEq/L — ABNORMAL LOW (ref 3.5–5.1)
Total Bilirubin: 0.4 mg/dL (ref 0.3–1.2)
Total Protein: 7 g/dL (ref 6.0–8.3)

## 2012-01-24 LAB — HEMOGLOBIN A1C: Mean Plasma Glucose: 123 mg/dL — ABNORMAL HIGH (ref <117)

## 2012-01-24 LAB — CARDIAC PANEL(CRET KIN+CKTOT+MB+TROPI)
Relative Index: 1.3 (ref 0.0–2.5)
Troponin I: <0.3 ng/mL (ref <0.30)

## 2012-01-24 LAB — CBC
HCT: 36.7 % (ref 36.0–46.0)
MCH: 30.1 pg (ref 26.0–34.0)
MCV: 90 fL (ref 78.0–100.0)
Platelets: 268 10*3/uL (ref 150–400)
RBC: 4.08 MIL/uL (ref 3.87–5.11)
WBC: 7 10*3/uL (ref 4.0–10.5)

## 2012-01-24 MED ORDER — POTASSIUM CHLORIDE CRYS ER 20 MEQ PO TBCR
40.0000 meq | EXTENDED_RELEASE_TABLET | Freq: Once | ORAL | Status: AC
  Administered 2012-01-24: 40 meq via ORAL
  Filled 2012-01-24 (×2): qty 2

## 2012-01-24 MED ORDER — POTASSIUM CHLORIDE CRYS ER 20 MEQ PO TBCR
40.0000 meq | EXTENDED_RELEASE_TABLET | Freq: Once | ORAL | Status: AC
  Administered 2012-01-24: 40 meq via ORAL

## 2012-01-24 MED ORDER — METOPROLOL TARTRATE 25 MG PO TABS: 12.5000 mg | ORAL_TABLET | Freq: Every day | ORAL | Status: DC

## 2012-01-24 MED ORDER — CARBAMIDE PEROXIDE 6.5 % OT SOLN: 5.0000 [drp] | Freq: Two times a day (BID) | OTIC | Status: AC

## 2012-01-24 MED ORDER — POTASSIUM CHLORIDE 10 MEQ/100ML IV SOLN
10.0000 meq | INTRAVENOUS | Status: DC
  Filled 2012-01-24 (×2): qty 100

## 2012-01-24 NOTE — Progress Notes (Signed)
Discharge Note: Pt is alert and oriented, VS are stable, denies CP.  Telemetry & IV discontinued.  Discharge instructions reviewed with patient, pt verbalizes understanding.  Wheelchair transportation provided, all belongings with patient  

## 2012-01-24 NOTE — Discharge Summary (Signed)
Physician Discharge Summary  Evelyn Irwin ZOX:[id number] DOB: Nov 06, 1933 DOA: 01/23/2012  PCP: Rogelia Boga, MD  Admit date: 01/23/2012 Discharge date: 01/24/2012  Recommendations for Outpatient Follow-up:  1. Follow up with pcp in one week and check BMP.   Discharge Diagnoses:  Principal Problem:  *Syncope and collapse Active Problems:  ANXIETY  HYPERTENSION  GERD  Acute hypokalemia  Palpitations   Discharge Condition: STABLE  Diet recommendation: low sodium diet.   Filed Weights   01/23/12 1300  Weight: 84.006 kg (185 lb 3.2 oz)    History of present illness:  78 year old lady with h/o Hypertension, GERD, came in to ED after an episode of syncope, this morning. As per the patient, patient woke up and was fixing her breakfast and the next thing she knew she was on the floor. Her husband was at her side. She doesn't know how long she was unconscious. She denies any dizziness, chest pain or shortness of breath, nuasea or vomiting. She was brought to the ED by her husband. She denies any weakness, tingling or numbness of the extremities. She is being admitted for evaluation of syncope.    Hospital Course:    1. Syncope and collapse: -possibly vaso vagal.  -no arrythmia's on tele overnight. Carotid duplex does not show ICA stenosis.  Echo showed hyperdynamic EF with dynamic obstruction. Started her on metoprolol and discontinued the hydrochlorothiazide. -  Resume aspirin.  - CT HEAD without contrast negative.    2. Hypertension:  -sub-optimal and resume home medications except hydrochlorothiazide and started on metoprolol.   3.GERD: resume protonix.      Procedures:  CT HEAD  Echo  Carotid duplex    Consultations:  none  Discharge Exam: Filed Vitals:   01/24/12 0500  BP: 143/82  Pulse: 69  Temp: 98.3 F (36.8 C)  Resp: 18   Filed Vitals:   01/23/12 1330 01/23/12 1335 01/23/12 2100 01/24/12 0500  BP: 145/85 154/92 130/78 143/82  Pulse:  95 96 73 69  Temp:   99 F (37.2 C) 98.3 F (36.8 C)  TempSrc:   Oral Oral  Resp:   18 18  Height:      Weight:      SpO2: 97% 98% 98% 94%    General: alert afebrile comfortable Cardiovascular: s1s2 Respiratory: CTAB Abdomen : soft NT ND BS+ Extremities: no pedal edema.  Neuro: no focal deficits.   Discharge Instructions  Discharge Orders    Future Appointments: Provider: Department: Dept Phone: Center:   05/10/2012 8:45 AM Gordy Savers, MD [facility] [phone] [facility]     Future Orders Please Complete By Expires   Diet - low sodium heart healthy      Discharge instructions      Comments:   Follow up with PCP in 2 to 3 weeks.  Stop debrox ear drops after one week.  We have held one of the blood pressure medication, Hydrouiuril. .   Activity as tolerated - No restrictions        Medication List  As of 01/24/2012 10:53 AM   STOP taking these medications         hydrochlorothiazide 25 MG tablet         TAKE these medications         amLODipine 10 MG tablet   Commonly known as: NORVASC   Take 1 tablet (10 mg total) by mouth daily.      APPLE CIDER VINEGAR PO   Take 15 mLs by mouth once.  With water      busPIRone 15 MG tablet   Commonly known as: BUSPAR   Take 0.5 tablets (7.5 mg total) by mouth daily.      carbamide peroxide 6.5 % otic solution   Commonly known as: DEBROX   Place 5 drops into the right ear 2 (two) times daily.      metoprolol tartrate 25 MG tablet   Commonly known as: LOPRESSOR   Take 0.5 tablets (12.5 mg total) by mouth daily.      multivitamin with minerals tablet   Take 1 tablet by mouth daily.      potassium chloride 10 MEQ tablet   Commonly known as: K-DUR   Take 1 tablet (10 mEq total) by mouth 2 (two) times daily.      telmisartan 80 MG tablet   Commonly known as: MICARDIS   Take 1 tablet (80 mg total) by mouth daily.      VITAMIN D PO   Take 1 tablet by mouth daily.              The results of  significant diagnostics from this hospitalization (including imaging, microbiology, ancillary and laboratory) are listed below for reference.    Significant Diagnostic Studies: Dg Chest 2 View  01/23/2012  *RADIOLOGY REPORT*  Clinical Data: Syncope.  History of hypertension.  CHEST - 2 VIEW  Comparison: 03/18/2006 and 11/26/2010.  Findings: The heart size and mediastinal contours are normal. The lungs are clear. There is no pleural effusion or pneumothorax. No acute osseous findings are identified.  Thoracic spine degenerative changes are stable.  IMPRESSION: Stable examination.  No active cardiopulmonary process.   Original Report Authenticated By: Gerrianne Scale, M.D.    Ct Head Wo Contrast  01/23/2012  *RADIOLOGY REPORT*  Clinical Data: Syncopal episode.  Fall.  CT HEAD WITHOUT CONTRAST  Technique:  Contiguous axial images were obtained from the base of the skull through the vertex without contrast.  Comparison: None.  Findings: There is no evidence for acute hemorrhage, hydrocephalus, mass lesion, or abnormal extra-axial fluid collection.  No definite CT evidence for acute infarction.  The visualized paranasal sinuses and mastoid air cells are clear.  IMPRESSION: Normal exam for age.   Original Report Authenticated By: ERIC A. MANSELL, M.D.     Microbiology: No results found for this or any previous visit (from the past 240 hour(s)).   Labs: Basic Metabolic Panel:  Lab 01/24/12 7829 01/23/12 1334 01/23/12 0922  NA 143 -- 144  K 3.3* -- 3.1*  CL 103 -- 102  CO2 28 -- 32  GLUCOSE 96 -- 120*  BUN 16 -- 16  CREATININE 0.81 -- 0.83  CALCIUM 9.5 -- 9.9  MG -- 2.1 --  PHOS -- -- --   Liver Function Tests:  Lab 01/24/12 0600  AST 22  ALT 14  ALKPHOS 82  BILITOT 0.4  PROT 7.0  ALBUMIN 3.5   No results found for this basename: LIPASE:5,AMYLASE:5 in the last 168 hours No results found for this basename: AMMONIA:5 in the last 168 hours CBC:  Lab 01/24/12 0600 01/23/12 0922  WBC  7.0 9.1  NEUTROABS -- 7.1  HGB 12.3 13.6  HCT 36.7 40.5  MCV 90.0 90.0  PLT 268 270   Cardiac Enzymes:  Lab 01/24/12 0600 01/23/12 2021 01/23/12 1338  CKTOTAL 198* 223* 249*  CKMB 2.6 3.2 3.8  CKMBINDEX -- -- --  TROPONINI <0.30 <0.30 <0.30   BNP: BNP (last 3 results)  Basename  01/23/12 1338  PROBNP 182.3   CBG:  Lab 01/23/12 0938  GLUCAP 102*      Signed:  Hernandez Losasso  [facility] 01/24/2012, 10:53 AM

## 2012-01-26 ENCOUNTER — Ambulatory Visit (INDEPENDENT_AMBULATORY_CARE_PROVIDER_SITE_OTHER): Payer: Medicare HMO | Admitting: Internal Medicine

## 2012-01-26 ENCOUNTER — Encounter: Payer: Self-pay | Admitting: Internal Medicine

## 2012-01-26 ENCOUNTER — Telehealth: Payer: Self-pay | Admitting: Internal Medicine

## 2012-01-26 VITALS — BP 130/80 | Temp 98.6°F | Wt 187.0 lb

## 2012-01-26 DIAGNOSIS — I10 Essential (primary) hypertension: Secondary | ICD-10-CM

## 2012-01-26 DIAGNOSIS — M1990 Unspecified osteoarthritis, unspecified site: Secondary | ICD-10-CM

## 2012-01-26 DIAGNOSIS — R55 Syncope and collapse: Secondary | ICD-10-CM

## 2012-01-26 DIAGNOSIS — F411 Generalized anxiety disorder: Secondary | ICD-10-CM

## 2012-01-26 MED ORDER — METOPROLOL TARTRATE 25 MG PO TABS: 25.0000 mg | ORAL_TABLET | Freq: Two times a day (BID) | ORAL | Status: DC

## 2012-01-26 NOTE — Telephone Encounter (Signed)
Caller: [name]/Patient; Patient Name: Evelyn Irwin; PCP: Eleonore Chiquito [facility]); Best Callback Phone Number: [phone]; Call regarding: Appointment; hospitalized 01/23/12 for syncope; released 8/24 and was told to call PCP 8/26 for a follow up appt; appt scheduled for 01/26/12 at 1600 with Dr.Kwiatkowski

## 2012-01-26 NOTE — Patient Instructions (Addendum)
Limit your sodium (Salt) intake    It is important that you exercise regularly, at least 20 minutes 3 to 4 times per week.  If you develop chest pain or shortness of breath seek  medical attention.  Return in one month for follow-up  Okay to discontinue potassium after one week

## 2012-01-27 ENCOUNTER — Encounter: Payer: Self-pay | Admitting: Internal Medicine

## 2012-01-27 NOTE — Progress Notes (Signed)
Subjective:    Patient ID: Evelyn Irwin, female    DOB: 12/17/1933, 78 y.o.   MRN: [id number]  HPI  78 year old patient who is seen today following her hospital discharge. She was discharged recently after a brief hospital admission for syncope. Telemetry observation revealed no abnormalities. A head CT was negative. Laboratory studies revealed mild hypokalemia and diuretic therapy was discontinued and metoprolol substituted. Since her discharge she has done quite well at times she feels still a bit lightheaded. She seems to be improving daily. Hospital records reviewed  Past Medical History  Diagnosis Date  . ALLERGIC RHINITIS 11/11/2006  . ANXIETY 11/11/2006  . CONSTIPATION 02/23/2008  . GERD 10/06/2007  . HYPERTENSION 11/11/2006  . HYPOKALEMIA, HX OF 02/09/2008  . OSTEOARTHRITIS 11/11/2006    History   Social History  . Marital Status: Married    Spouse Name: N/A    Number of Children: N/A  . Years of Education: N/A   Occupational History  . Not on file.   Social History Main Topics  . Smoking status: Never Smoker   . Smokeless tobacco: Never Used  . Alcohol Use: No  . Drug Use: No  . Sexually Active: Not on file   Other Topics Concern  . Not on file   Social History Narrative  . No narrative on file    No past surgical history on file.  No family history on file.  No Known Allergies  Current Outpatient Prescriptions on File Prior to Visit  Medication Sig Dispense Refill  . amLODipine (NORVASC) 10 MG tablet Take 1 tablet (10 mg total) by mouth daily.  90 tablet  2  . APPLE CIDER VINEGAR PO Take 15 mLs by mouth once. With water      . busPIRone (BUSPAR) 15 MG tablet Take 0.5 tablets (7.5 mg total) by mouth daily.  90 tablet  2  . carbamide peroxide (DEBROX) 6.5 % otic solution Place 5 drops into the right ear 2 (two) times daily.  15 mL  0  . Cholecalciferol (VITAMIN D PO) Take 1 tablet by mouth daily.      . metoprolol tartrate (LOPRESSOR) 25 MG tablet Take 1 tablet  (25 mg total) by mouth 2 (two) times daily.  30 tablet  0  . Multiple Vitamins-Minerals (MULTIVITAMIN WITH MINERALS) tablet Take 1 tablet by mouth daily.      . potassium chloride (KLOR-CON 10) 10 MEQ tablet Take 1 tablet (10 mEq total) by mouth 2 (two) times daily.  180 tablet  6  . telmisartan (MICARDIS) 80 MG tablet Take 1 tablet (80 mg total) by mouth daily.  90 tablet  2    BP 130/80  Temp 98.6 F (37 C) (Oral)  Wt 187 lb (84.823 kg)      Review of Systems  Constitutional: Negative.   HENT: Negative for hearing loss, congestion, sore throat, rhinorrhea, dental problem, sinus pressure and tinnitus.   Eyes: Negative for pain, discharge and visual disturbance.  Respiratory: Negative for cough and shortness of breath.   Cardiovascular: Negative for chest pain, palpitations and leg swelling.  Gastrointestinal: Negative for nausea, vomiting, abdominal pain, diarrhea, constipation, blood in stool and abdominal distention.  Genitourinary: Negative for dysuria, urgency, frequency, hematuria, flank pain, vaginal bleeding, vaginal discharge, difficulty urinating, vaginal pain and pelvic pain.  Musculoskeletal: Negative for joint swelling, arthralgias and gait problem.  Skin: Negative for rash.  Neurological: Positive for syncope and light-headedness. Negative for dizziness, speech difficulty, weakness, numbness and headaches.  Hematological: Negative  for adenopathy.  Psychiatric/Behavioral: Negative for behavioral problems, dysphoric mood and agitation. The patient is not nervous/anxious.        Objective:   Physical Exam  Constitutional: She is oriented to person, place, and time. She appears well-developed and well-nourished.  HENT:  Head: Normocephalic.  Right Ear: External ear normal.  Left Ear: External ear normal.  Mouth/Throat: Oropharynx is clear and moist.  Eyes: Conjunctivae and EOM are normal. Pupils are equal, round, and reactive to light.  Neck: Normal range of motion.  Neck supple. No thyromegaly present.  Cardiovascular: Normal rate, regular rhythm, normal heart sounds and intact distal pulses.   Pulmonary/Chest: Effort normal and breath sounds normal.  Abdominal: Soft. Bowel sounds are normal. She exhibits no mass. There is no tenderness.  Musculoskeletal: Normal range of motion.  Lymphadenopathy:    She has no cervical adenopathy.  Neurological: She is alert and oriented to person, place, and time.  Skin: Skin is warm and dry. No rash noted.  Psychiatric: She has a normal mood and affect. Her behavior is normal.          Assessment & Plan:   History of syncope. Diuretic therapy has been discontinued we'll up titrate metoprolol to 25 mg twice a day Hypertension. Recheck 4 weeks History palpitations stable History mild hypokalemia. Will continue potassium supplementation for 2 weeks then discontinue. Diuretic therapy has been discontinued

## 2012-01-28 ENCOUNTER — Telehealth: Payer: Self-pay | Admitting: Internal Medicine

## 2012-01-28 NOTE — Telephone Encounter (Signed)
Spoke with pt- informed of dr. kwiatkowski's instructions 

## 2012-01-28 NOTE — Telephone Encounter (Signed)
Pt states she is still not feeling well and thinks the issue is in her back and is requesting a x-ray of her R upper back.

## 2012-01-28 NOTE — Telephone Encounter (Signed)
Just seen 01/26/12 post hospital  Please advise

## 2012-01-28 NOTE — Telephone Encounter (Signed)
Notify patient that x-rays in the hospital did not reveal any fractures of her ribs or abnormalities in her upper back area. Please call in a new prescription for tramadol 50 #50 to take every 6 hours as needed for pain.  Will observe back through the weekend before considering additional x-rays. Patient likely has some soft tissue trauma related to her recent syncopal episode

## 2012-02-26 ENCOUNTER — Ambulatory Visit (INDEPENDENT_AMBULATORY_CARE_PROVIDER_SITE_OTHER): Payer: Medicare HMO | Admitting: Internal Medicine

## 2012-02-26 ENCOUNTER — Encounter: Payer: Self-pay | Admitting: Internal Medicine

## 2012-02-26 VITALS — BP 130/80 | Wt 188.0 lb

## 2012-02-26 DIAGNOSIS — I10 Essential (primary) hypertension: Secondary | ICD-10-CM

## 2012-02-26 DIAGNOSIS — R002 Palpitations: Secondary | ICD-10-CM

## 2012-02-26 NOTE — Progress Notes (Signed)
Subjective:    Patient ID: Evelyn Irwin, female    DOB: [birth year], 78 y.o.   MRN: [id number]  HPI  78 year old patient who has a history of treated hypertension. She was admitted to the hospital one month ago for evaluation of syncope in the setting of palpitations. She is noted to have diuretic associated hypokalemia and diuretic therapy has been discontinued. She was treated with potassium supplementation and started on metoprolol. Over the past months he has done fairly well she has had no episodes of syncope or near syncope. She does note occasional palpitations these occur briefly prior to her next dose of metoprolol. She generally feels quite well. Evaluation included a 2-D echocardiogram that revealed normal LV function.  Past Medical History  Diagnosis Date  . ALLERGIC RHINITIS 11/11/2006  . ANXIETY 11/11/2006  . CONSTIPATION 02/23/2008  . GERD 10/06/2007  . HYPERTENSION 11/11/2006  . HYPOKALEMIA, HX OF 02/09/2008  . OSTEOARTHRITIS 11/11/2006    History   Social History  . Marital Status: Married    Spouse Name: N/A    Number of Children: N/A  . Years of Education: N/A   Occupational History  . Not on file.   Social History Main Topics  . Smoking status: Never Smoker   . Smokeless tobacco: Never Used  . Alcohol Use: No  . Drug Use: No  . Sexually Active: Not on file   Other Topics Concern  . Not on file   Social History Narrative  . No narrative on file    No past surgical history on file.  No family history on file.  No Known Allergies  Current Outpatient Prescriptions on File Prior to Visit  Medication Sig Dispense Refill  . amLODipine (NORVASC) 10 MG tablet Take 1 tablet (10 mg total) by mouth daily.  90 tablet  2  . APPLE CIDER VINEGAR PO Take 15 mLs by mouth once. With water      . busPIRone (BUSPAR) 15 MG tablet Take 0.5 tablets (7.5 mg total) by mouth daily.  90 tablet  2  . Cholecalciferol (VITAMIN D PO) Take 1 tablet by mouth daily.      . metoprolol  tartrate (LOPRESSOR) 25 MG tablet Take 1 tablet (25 mg total) by mouth 2 (two) times daily.  30 tablet  0  . Multiple Vitamins-Minerals (MULTIVITAMIN WITH MINERALS) tablet Take 1 tablet by mouth daily.      Marland Kitchen telmisartan (MICARDIS) 80 MG tablet Take 1 tablet (80 mg total) by mouth daily.  90 tablet  2    BP 130/80  Wt 188 lb (85.276 kg)      Review of Systems  Constitutional: Negative.   HENT: Negative for hearing loss, congestion, sore throat, rhinorrhea, dental problem, sinus pressure and tinnitus.   Eyes: Negative for pain, discharge and visual disturbance.  Respiratory: Negative for cough and shortness of breath.   Cardiovascular: Positive for palpitations. Negative for chest pain and leg swelling.  Gastrointestinal: Negative for nausea, vomiting, abdominal pain, diarrhea, constipation, blood in stool and abdominal distention.  Genitourinary: Negative for dysuria, urgency, frequency, hematuria, flank pain, vaginal bleeding, vaginal discharge, difficulty urinating, vaginal pain and pelvic pain.  Musculoskeletal: Negative for joint swelling, arthralgias and gait problem.  Skin: Negative for rash.  Neurological: Negative for dizziness, syncope, speech difficulty, weakness, numbness and headaches.  Hematological: Negative for adenopathy.  Psychiatric/Behavioral: Negative for behavioral problems, dysphoric mood and agitation. The patient is not nervous/anxious.        Objective:   Physical Exam  Vitals reviewed. Constitutional: She is oriented to person, place, and time. She appears well-developed and well-nourished.       Blood pressure 130/80  HENT:  Head: Normocephalic.  Right Ear: External ear normal.  Left Ear: External ear normal.  Mouth/Throat: Oropharynx is clear and moist.  Eyes: Conjunctivae normal and EOM are normal. Pupils are equal, round, and reactive to light.  Neck: Normal range of motion. Neck supple. No thyromegaly present.  Cardiovascular: Normal rate,  regular rhythm, normal heart sounds and intact distal pulses.        No murmur no ectopics  Pulmonary/Chest: Effort normal and breath sounds normal.  Abdominal: Soft. Bowel sounds are normal. She exhibits no mass. There is no tenderness.  Musculoskeletal: Normal range of motion.  Lymphadenopathy:    She has no cervical adenopathy.  Neurological: She is alert and oriented to person, place, and time.  Skin: Skin is warm and dry. No rash noted.  Psychiatric: She has a normal mood and affect. Her behavior is normal.          Assessment & Plan:   Hypertension controlled History of syncope. This has not recurred Rare palpitations. We'll continue to monitor we'll continue present dose of metoprolol  Recheck 3 months consider Holter monitor or event monitor if  becomes more symptomatic

## 2012-02-26 NOTE — Patient Instructions (Signed)
Limit your sodium (Salt) intake    It is important that you exercise regularly, at least 20 minutes 3 to 4 times per week.  If you develop chest pain or shortness of breath seek  medical attention.  Return in 3 months for follow-up  

## 2012-02-27 ENCOUNTER — Other Ambulatory Visit: Payer: Self-pay | Admitting: Internal Medicine

## 2012-03-31 ENCOUNTER — Other Ambulatory Visit: Payer: Self-pay | Admitting: Internal Medicine

## 2012-05-10 ENCOUNTER — Encounter: Payer: Self-pay | Admitting: Internal Medicine

## 2012-05-10 ENCOUNTER — Ambulatory Visit (INDEPENDENT_AMBULATORY_CARE_PROVIDER_SITE_OTHER): Payer: Medicare HMO | Admitting: Internal Medicine

## 2012-05-10 VITALS — BP 160/90 | HR 78 | Temp 98.0°F | Resp 18 | Ht 61.5 in | Wt 189.0 lb

## 2012-05-10 DIAGNOSIS — M1990 Unspecified osteoarthritis, unspecified site: Secondary | ICD-10-CM

## 2012-05-10 DIAGNOSIS — Z0000 Encounter for general adult medical examination without abnormal findings: Secondary | ICD-10-CM

## 2012-05-10 DIAGNOSIS — I10 Essential (primary) hypertension: Secondary | ICD-10-CM

## 2012-05-10 NOTE — Patient Instructions (Signed)

## 2012-05-10 NOTE — Progress Notes (Signed)
Subjective:    Patient ID: Evelyn Irwin, female    DOB: 12-Jan-1934, 78 y.o.   MRN: [id number]  Hypertension Pertinent negatives include no chest pain, headaches, palpitations or shortness of breath.    CC: cpx - doing well .   History of Present Illness:   78  year-old patient who is seen today for a comprehensive evaluation. Medical problems include treated hypertension. She has a history of osteoarthritis mainly affecting the knees. She is followed closely by orthopedics. She has anxiety disorder and gastroesophageal reflux disease.  She was hospital briefly in August 2 2 of syncope that was felt to be vasovagal. She has had no recurrent syncope. Today she feels well. She does monitor home blood pressure readings with nice results. Blood pressure bedside today but she has not taken any medication  Here for Medicare AWV:   1. Risk factors based on Past M, S, F history: patient has a history of treated hypertension. Family history of cerebrovascular disease  2. Physical Activities: limited due to bilateral knee pain  3. Depression/mood: history anxiety, but no history of depression  4. Hearing: no deficit  5. ADL's: independent in all aspects of daily living  6. Fall Risk: low  7. Home Safety: no problems identified  8. Height, weight, &visual acuity:height and weight stable. No difficulty with visual acuity  9. Counseling: follow-up gynecology next week  10. Labs ordered based on risk factors: laboratory profile, including TSH, and lipid profile will be reviewed  11. Referral Coordination- GYN and orthopedic referral  12. Care Plan- heart healthy diet, weight loss encouraged  13. Cognitive Assessment- alert and oriented, with normal affect   Preventive Screening-Counseling & Management  Alcohol-Tobacco  Smoking Status: never   Allergies (verified):  No Known Drug Allergies   Past History:  Past Medical History:  Reviewed history from 10/06/2007 and no changes required.   [id number]  Hosp age 73 for BP eval  Allergic rhinitis  Anxiety  Hypertension  Osteoarthritis  GERD   Past Surgical History:  Reviewed history from 10/06/2007 and no changes required.  melanoma removed from back  colonoscopy July 2005  2-D echocardiogram December 2004  stress thallium January 2005  Status post right total knee replacement therapy July 2012  Family History:  Reviewed history from 12/26/2008 and no changes required.  Family History Hypertension  Family History Other cancer: sister breast ca  Family History of Stroke F 1st degree relative <60  father died age 61, CVA  mother died age 49, CVA  Four brothers 3 sisters  Positive for CVD hypertension, breast cancer  one brother died of a stroke at age 82  sister died of breast cancer, age 60  one brother died of CVA   Social History:  Reviewed history from 10/06/2007 and no changes required.  Married  3 children, and 3 grandchildren     Review of Systems  Constitutional: Negative for fever, appetite change, fatigue and unexpected weight change.  HENT: Negative for hearing loss, ear pain, nosebleeds, congestion, sore throat, mouth sores, trouble swallowing, neck stiffness, dental problem, voice change, sinus pressure and tinnitus.   Eyes: Negative for photophobia, pain, redness and visual disturbance.  Respiratory: Negative for cough, chest tightness and shortness of breath.   Cardiovascular: Negative for chest pain, palpitations and leg swelling.  Gastrointestinal: Negative for nausea, vomiting, abdominal pain, diarrhea, constipation, blood in stool, abdominal distention and rectal pain.  Genitourinary: Negative for dysuria, urgency, frequency, hematuria, flank pain, vaginal bleeding, vaginal discharge, difficulty urinating,  genital sores, vaginal pain, menstrual problem and pelvic pain.  Musculoskeletal: Negative for back pain and arthralgias.  Skin: Positive for color change. Negative for rash.        Dysplastic pigmented lesion right lower leg. Scheduled  for dermatology evaluation  Neurological: Negative for dizziness, syncope, speech difficulty, weakness, light-headedness, numbness and headaches.  Hematological: Negative for adenopathy. Does not bruise/bleed easily.  Psychiatric/Behavioral: Negative for suicidal ideas, behavioral problems, self-injury, dysphoric mood and agitation. The patient is not nervous/anxious.        Objective:   Physical Exam  Constitutional: She is oriented to person, place, and time. She appears well-developed and well-nourished.  HENT:  Head: Normocephalic and atraumatic.  Right Ear: External ear normal.  Left Ear: External ear normal.  Mouth/Throat: Oropharynx is clear and moist.       Low hanging soft palate  Eyes: Conjunctivae normal and EOM are normal.  Neck: Normal range of motion. Neck supple. No JVD present. No thyromegaly present.  Cardiovascular: Normal rate, regular rhythm, normal heart sounds and intact distal pulses.   No murmur heard.      Pedal pulses full except for diminished right posterior tibial pulse  Pulmonary/Chest: Effort normal and breath sounds normal. She has no wheezes. She has no rales.  Abdominal: Soft. Bowel sounds are normal. She exhibits no distension and no mass. There is no tenderness. There is no rebound and no guarding.  Musculoskeletal: Normal range of motion. She exhibits no edema and no tenderness.  Neurological: She is alert and oriented to person, place, and time. She has normal reflexes. No cranial nerve deficit. She exhibits normal muscle tone. Coordination normal.  Skin: Skin is warm and dry. Rash noted.  Psychiatric: She has a normal mood and affect. Her behavior is normal.          Assessment & Plan:   Hypertension well controlled Preventive health examination Osteoarthritis  We'll recheck in 6 months Dermatology followup as scheduled Followup orthopedics as scheduled  We'll review lab update

## 2012-08-23 ENCOUNTER — Telehealth: Payer: Self-pay | Admitting: Internal Medicine

## 2012-08-23 MED ORDER — AMLODIPINE BESYLATE 10 MG PO TABS: 10.0000 mg | ORAL_TABLET | Freq: Every day | ORAL | Status: DC

## 2012-08-23 NOTE — Telephone Encounter (Signed)
Pt needs refill on amlopidine 10 mg [facility]

## 2012-08-23 NOTE — Telephone Encounter (Signed)
Left message on voicemail. Rx refill for Amolopidine was sent to pharmacy.

## 2012-08-24 ENCOUNTER — Other Ambulatory Visit: Payer: Self-pay | Admitting: Internal Medicine

## 2012-08-25 ENCOUNTER — Other Ambulatory Visit: Payer: Self-pay | Admitting: Internal Medicine

## 2012-11-08 ENCOUNTER — Encounter: Payer: Self-pay | Admitting: Internal Medicine

## 2012-11-08 ENCOUNTER — Ambulatory Visit (INDEPENDENT_AMBULATORY_CARE_PROVIDER_SITE_OTHER): Payer: Medicare HMO | Admitting: Internal Medicine

## 2012-11-08 VITALS — BP 150/86 | HR 88 | Temp 98.9°F | Resp 20 | Wt 190.0 lb

## 2012-11-08 DIAGNOSIS — R5383 Other fatigue: Secondary | ICD-10-CM

## 2012-11-08 DIAGNOSIS — K219 Gastro-esophageal reflux disease without esophagitis: Secondary | ICD-10-CM

## 2012-11-08 DIAGNOSIS — I10 Essential (primary) hypertension: Secondary | ICD-10-CM

## 2012-11-08 DIAGNOSIS — R5381 Other malaise: Secondary | ICD-10-CM

## 2012-11-08 LAB — CBC WITH DIFFERENTIAL/PLATELET
Basophils Absolute: 0 10*3/uL (ref 0.0–0.1)
Basophils Relative: 0.3 % (ref 0.0–3.0)
Eosinophils Relative: 0.6 % (ref 0.0–5.0)
HCT: 41.3 % (ref 36.0–46.0)
Hemoglobin: 13.6 g/dL (ref 12.0–15.0)
Lymphocytes Relative: 27.6 % (ref 12.0–46.0)
Lymphs Abs: 2 10*3/uL (ref 0.7–4.0)
Monocytes Relative: 4.6 % (ref 3.0–12.0)
Neutro Abs: 5 10*3/uL (ref 1.4–7.7)
RBC: 4.38 Mil/uL (ref 3.87–5.11)
RDW: 14.6 % (ref 11.5–14.6)
WBC: 7.4 10*3/uL (ref 4.5–10.5)

## 2012-11-08 LAB — BASIC METABOLIC PANEL
Calcium: 9.4 mg/dL (ref 8.4–10.5)
Creatinine, Ser: 0.9 mg/dL (ref 0.4–1.2)
GFR: 76.1 mL/min (ref >60.00)

## 2012-11-08 NOTE — Progress Notes (Signed)
Subjective:    Patient ID: Evelyn Irwin, female    DOB: 1934-03-30, 79 y.o.   MRN: [id number]  HPI  79 year old patient who is seen today for followup. She has a history of treated hypertension. She is doing well today without concerns or complaints. No medications today. Blood pressure arrival slightly high at 150/86. She was hospitalized in August of last year. She has a history of hypokalemia osteoarthritis and gastroesophageal reflux disease.  Past Medical History  Diagnosis Date  . ALLERGIC RHINITIS 11/11/2006  . ANXIETY 11/11/2006  . CONSTIPATION 02/23/2008  . GERD 10/06/2007  . HYPERTENSION 11/11/2006  . HYPOKALEMIA, HX OF 02/09/2008  . OSTEOARTHRITIS 11/11/2006    History   Social History  . Marital Status: Married    Spouse Name: N/A    Number of Children: N/A  . Years of Education: N/A   Occupational History  . Not on file.   Social History Main Topics  . Smoking status: Never Smoker   . Smokeless tobacco: Never Used  . Alcohol Use: No  . Drug Use: No  . Sexually Active: Not on file   Other Topics Concern  . Not on file   Social History Narrative  . No narrative on file    History reviewed. No pertinent past surgical history.  No family history on file.  No Known Allergies  Current Outpatient Prescriptions on File Prior to Visit  Medication Sig Dispense Refill  . amLODipine (NORVASC) 10 MG tablet TAKE 1 TABLET (10 MG TOTAL) BY MOUTH DAILY.  90 tablet  3  . APPLE CIDER VINEGAR PO Take 15 mLs by mouth once. With water      . busPIRone (BUSPAR) 15 MG tablet Take 0.5 tablets (7.5 mg total) by mouth daily.  90 tablet  2  . metoprolol tartrate (LOPRESSOR) 25 MG tablet TAKE 1 TABLET BY MOUTH TWICE A DAY  180 tablet  3  . MICARDIS 80 MG tablet TAKE 1 TABLET (80 MG TOTAL) BY MOUTH DAILY.  90 tablet  1  . Multiple Vitamins-Minerals (MULTIVITAMIN WITH MINERALS) tablet Take 1 tablet by mouth daily.       No current facility-administered medications on file prior to  visit.    BP 150/86  Pulse 88  Temp(Src) 98.9 F (37.2 C) (Oral)  Resp 20  Wt 190 lb (86.183 kg)  BMI 35.32 kg/m2  SpO2 97%       Review of Systems  Constitutional: Negative.   HENT: Negative for hearing loss, congestion, sore throat, rhinorrhea, dental problem, sinus pressure and tinnitus.   Eyes: Negative for pain, discharge and visual disturbance.  Respiratory: Negative for cough and shortness of breath.   Cardiovascular: Negative for chest pain, palpitations and leg swelling.  Gastrointestinal: Negative for nausea, vomiting, abdominal pain, diarrhea, constipation, blood in stool and abdominal distention.  Genitourinary: Negative for dysuria, urgency, frequency, hematuria, flank pain, vaginal bleeding, vaginal discharge, difficulty urinating, vaginal pain and pelvic pain.  Musculoskeletal: Negative for joint swelling, arthralgias and gait problem.  Skin: Negative for rash.  Neurological: Negative for dizziness, syncope, speech difficulty, weakness, numbness and headaches.  Hematological: Negative for adenopathy.  Psychiatric/Behavioral: Negative for behavioral problems, dysphoric mood and agitation. The patient is not nervous/anxious.        Objective:   Physical Exam  Constitutional: She is oriented to person, place, and time. She appears well-developed and well-nourished.  Blood pressure 140/80  HENT:  Head: Normocephalic.  Right Ear: External ear normal.  Left Ear: External ear normal.  Mouth/Throat: Oropharynx is clear and moist.  Eyes: Conjunctivae and EOM are normal. Pupils are equal, round, and reactive to light.  Neck: Normal range of motion. Neck supple. No thyromegaly present.  Cardiovascular: Normal rate, regular rhythm, normal heart sounds and intact distal pulses.   Pulmonary/Chest: Effort normal and breath sounds normal.  Abdominal: Soft. Bowel sounds are normal. She exhibits no mass. There is no tenderness.  Musculoskeletal: Normal range of motion.   Lymphadenopathy:    She has no cervical adenopathy.  Neurological: She is alert and oriented to person, place, and time.  Skin: Skin is warm and dry. No rash noted.  Psychiatric: She has a normal mood and affect. Her behavior is normal.          Assessment & Plan:   Hypertension well controlled. Repeat blood pressure 140/80 Osteoarthritis GERD  Recheck in 4 months

## 2012-11-08 NOTE — Patient Instructions (Signed)
Limit your sodium (Salt) intake  Please check your blood pressure on a regular basis.  If it is consistently greater than 150/90, please make an office appointment.  Return in 4 months for follow-up  

## 2013-01-03 ENCOUNTER — Other Ambulatory Visit: Payer: Self-pay | Admitting: Internal Medicine

## 2013-02-28 ENCOUNTER — Other Ambulatory Visit: Payer: Self-pay | Admitting: Internal Medicine

## 2013-03-10 ENCOUNTER — Encounter: Payer: Self-pay | Admitting: Internal Medicine

## 2013-03-10 ENCOUNTER — Ambulatory Visit: Payer: Medicare HMO | Admitting: Internal Medicine

## 2013-03-10 ENCOUNTER — Ambulatory Visit (INDEPENDENT_AMBULATORY_CARE_PROVIDER_SITE_OTHER): Payer: Medicare HMO | Admitting: Internal Medicine

## 2013-03-10 VITALS — BP 128/86 | HR 68 | Temp 98.0°F | Wt 194.0 lb

## 2013-03-10 DIAGNOSIS — M1990 Unspecified osteoarthritis, unspecified site: Secondary | ICD-10-CM

## 2013-03-10 DIAGNOSIS — I10 Essential (primary) hypertension: Secondary | ICD-10-CM

## 2013-03-10 NOTE — Progress Notes (Signed)
Subjective:    Patient ID: Evelyn Irwin, female    DOB: 12/04/1933, 79 y.o.   MRN: [id number]  HPI   79 year old patient who has treated hypertension as well as osteoarthritis. She is seen for her four-month followup. She is doing quite well. Her only complaint is some mild forgetfulness.  Laboratory studies reviewed from June  Past Medical History  Diagnosis Date  . ALLERGIC RHINITIS 11/11/2006  . ANXIETY 11/11/2006  . CONSTIPATION 02/23/2008  . GERD 10/06/2007  . HYPERTENSION 11/11/2006  . HYPOKALEMIA, HX OF 02/09/2008  . OSTEOARTHRITIS 11/11/2006    History   Social History  . Marital Status: Married    Spouse Name: N/A    Number of Children: N/A  . Years of Education: N/A   Occupational History  . Not on file.   Social History Main Topics  . Smoking status: Never Smoker   . Smokeless tobacco: Never Used  . Alcohol Use: No  . Drug Use: No  . Sexual Activity: Not on file   Other Topics Concern  . Not on file   Social History Narrative  . No narrative on file    History reviewed. No pertinent past surgical history.  No family history on file.  No Known Allergies  Current Outpatient Prescriptions on File Prior to Visit  Medication Sig Dispense Refill  . amLODipine (NORVASC) 10 MG tablet TAKE 1 TABLET (10 MG TOTAL) BY MOUTH DAILY.  90 tablet  3  . APPLE CIDER VINEGAR PO Take 15 mLs by mouth once. With water      . busPIRone (BUSPAR) 15 MG tablet TAKE 1/2 TABLET BY MOUTH EVERY DAY  90 tablet  1  . metoprolol tartrate (LOPRESSOR) 25 MG tablet TAKE 1 TABLET BY MOUTH TWICE A DAY  180 tablet  3  . Multiple Vitamins-Minerals (MULTIVITAMIN WITH MINERALS) tablet Take 1 tablet by mouth daily.      Marland Kitchen telmisartan (MICARDIS) 80 MG tablet TAKE 1 TABLET BY MOUTH DAILY  90 tablet  3   No current facility-administered medications on file prior to visit.    BP 128/86  Pulse 68  Temp(Src) 98 F (36.7 C) (Oral)  Wt 194 lb (87.998 kg)  BMI 36.07 kg/m2  SpO2  98%       Review of Systems  Constitutional: Negative.   HENT: Negative for congestion, dental problem, hearing loss, rhinorrhea, sinus pressure, sore throat and tinnitus.   Eyes: Negative for pain, discharge and visual disturbance.  Respiratory: Negative for cough and shortness of breath.   Cardiovascular: Negative for chest pain, palpitations and leg swelling.  Gastrointestinal: Negative for nausea, vomiting, abdominal pain, diarrhea, constipation, blood in stool and abdominal distention.  Genitourinary: Negative for dysuria, urgency, frequency, hematuria, flank pain, vaginal bleeding, vaginal discharge, difficulty urinating, vaginal pain and pelvic pain.  Musculoskeletal: Negative for arthralgias, gait problem and joint swelling.  Skin: Negative for rash.  Neurological: Negative for dizziness, syncope, speech difficulty, weakness, numbness and headaches.  Hematological: Negative for adenopathy.  Psychiatric/Behavioral: Negative for behavioral problems, dysphoric mood and agitation. The patient is not nervous/anxious.        Objective:   Physical Exam  Constitutional: She is oriented to person, place, and time. She appears well-developed and well-nourished.  HENT:  Head: Normocephalic.  Right Ear: External ear normal.  Left Ear: External ear normal.  Mouth/Throat: Oropharynx is clear and moist.  Eyes: Conjunctivae and EOM are normal. Pupils are equal, round, and reactive to light.  Neck: Normal range of motion. Neck  supple. No thyromegaly present.  Cardiovascular: Normal rate, regular rhythm, normal heart sounds and intact distal pulses.   Pulmonary/Chest: Effort normal and breath sounds normal.  Abdominal: Soft. Bowel sounds are normal. She exhibits no mass. There is no tenderness.  Musculoskeletal: Normal range of motion.  Lymphadenopathy:    She has no cervical adenopathy.  Neurological: She is alert and oriented to person, place, and time.  Skin: Skin is warm and dry. No  rash noted.  Psychiatric: She has a normal mood and affect. Her behavior is normal.          Assessment & Plan:   Hypertension. Well controlled. We'll continue present regimen Osteoarthritis stable  Recheck in 6 months No change therapy

## 2013-03-10 NOTE — Patient Instructions (Signed)
Limit your sodium (Salt) intake    It is important that you exercise regularly, at least 20 minutes 3 to 4 times per week.  If you develop chest pain or shortness of breath seek  medical attention.  You need to lose weight.  Consider a lower calorie diet and regular exercise.  Return in 6 months for follow-up   

## 2013-07-29 ENCOUNTER — Emergency Department (HOSPITAL_COMMUNITY)
Admission: EM | Admit: 2013-07-29 | Discharge: 2013-07-30 | Disposition: A | Discharge status: Home / Self Care | Payer: Medicare HMO | Attending: Emergency Medicine | Admitting: Emergency Medicine

## 2013-07-29 ENCOUNTER — Encounter (HOSPITAL_COMMUNITY): Payer: Self-pay | Admitting: Emergency Medicine

## 2013-07-29 DIAGNOSIS — M549 Dorsalgia, unspecified: Secondary | ICD-10-CM

## 2013-07-29 DIAGNOSIS — Z862 Personal history of diseases of the blood and blood-forming organs and certain disorders involving the immune mechanism: Secondary | ICD-10-CM | POA: Insufficient documentation

## 2013-07-29 DIAGNOSIS — M546 Pain in thoracic spine: Secondary | ICD-10-CM | POA: Insufficient documentation

## 2013-07-29 DIAGNOSIS — F411 Generalized anxiety disorder: Secondary | ICD-10-CM | POA: Insufficient documentation

## 2013-07-29 DIAGNOSIS — Z8739 Personal history of other diseases of the musculoskeletal system and connective tissue: Secondary | ICD-10-CM | POA: Insufficient documentation

## 2013-07-29 DIAGNOSIS — Z8639 Personal history of other endocrine, nutritional and metabolic disease: Secondary | ICD-10-CM | POA: Insufficient documentation

## 2013-07-29 DIAGNOSIS — Z8719 Personal history of other diseases of the digestive system: Secondary | ICD-10-CM | POA: Insufficient documentation

## 2013-07-29 DIAGNOSIS — Z79899 Other long term (current) drug therapy: Secondary | ICD-10-CM | POA: Insufficient documentation

## 2013-07-29 DIAGNOSIS — R109 Unspecified abdominal pain: Secondary | ICD-10-CM | POA: Insufficient documentation

## 2013-07-29 DIAGNOSIS — I10 Essential (primary) hypertension: Secondary | ICD-10-CM | POA: Insufficient documentation

## 2013-07-29 NOTE — ED Notes (Addendum)
C/o lower back pain x 1 month.  Pt states pain is worse with movement.  Denies known injury. Denies chest pain, sob, and nausea.

## 2013-07-30 ENCOUNTER — Telehealth (HOSPITAL_BASED_OUTPATIENT_CLINIC_OR_DEPARTMENT_OTHER): Payer: Self-pay | Admitting: Emergency Medicine

## 2013-07-30 ENCOUNTER — Emergency Department (HOSPITAL_COMMUNITY): Payer: Medicare HMO

## 2013-07-30 LAB — I-STAT CHEM 8, ED
BUN: 20 mg/dL (ref 6–23)
Calcium, Ion: 1.22 mmol/L (ref 1.13–1.30)
Chloride: 100 mEq/L (ref 96–112)
Creatinine, Ser: 1.2 mg/dL — ABNORMAL HIGH (ref 0.50–1.10)
Glucose, Bld: 111 mg/dL — ABNORMAL HIGH (ref 70–99)
HEMATOCRIT: 42 % (ref 36.0–46.0)
Hemoglobin: 14.3 g/dL (ref 12.0–15.0)
Potassium: 3.1 mEq/L — ABNORMAL LOW (ref 3.7–5.3)
Sodium: 143 mEq/L (ref 137–147)
TCO2: 29 mmol/L (ref 0–100)

## 2013-07-30 LAB — URINALYSIS, ROUTINE W REFLEX MICROSCOPIC
BILIRUBIN URINE: NEGATIVE
GLUCOSE, UA: NEGATIVE mg/dL
KETONES UR: NEGATIVE mg/dL
Leukocytes, UA: NEGATIVE
NITRITE: NEGATIVE
PH: 8 (ref 5.0–8.0)
Protein, ur: NEGATIVE mg/dL
SPECIFIC GRAVITY, URINE: 1.017 (ref 1.005–1.030)
Urobilinogen, UA: 0.2 mg/dL (ref 0.0–1.0)

## 2013-07-30 LAB — URINE MICROSCOPIC-ADD ON

## 2013-07-30 LAB — I-STAT TROPONIN, ED: TROPONIN I, POC: 0 ng/mL (ref 0.00–0.08)

## 2013-07-30 MED ORDER — TRAMADOL HCL 50 MG PO TABS
50.0000 mg | ORAL_TABLET | Freq: Once | ORAL | Status: AC
  Administered 2013-07-30: 50 mg via ORAL
  Filled 2013-07-30: qty 1

## 2013-07-30 MED ORDER — TRAMADOL HCL 50 MG PO TABS: 50.0000 mg | ORAL_TABLET | Freq: Three times a day (TID) | ORAL | Status: DC

## 2013-07-30 NOTE — ED Provider Notes (Signed)
CSN: [id number]     Arrival date & time 07/29/13  2322 History   First MD Initiated Contact with Patient 07/29/13 2330     Chief Complaint  Patient presents with  . Back Pain     (Consider location/radiation/quality/duration/timing/severity/associated sxs/prior Treatment) Patient is a 80 y.o. female presenting with back pain. The history is provided by the patient. No language interpreter was used.  Back Pain Associated symptoms: no abdominal pain, no chest pain, no dysuria and no fever   Associated symptoms comment:  Pain that started in bilateral upper back at shoulders and extended down to bilateral flanks x 1 month ago. No injury. "It feels like gas", but she denies abdominal pain, N, V. She has tried [employer] without significant relief. She states that last night was the worst pain she has had. It is worse with movement, specifically when she goes to lie down or get up from lying position. She denies shortness of breath, painful respiration, cough, fever, or chest pain. It is unchanged with eating or drinking.     Past Medical History  Diagnosis Date  . ALLERGIC RHINITIS 11/11/2006  . ANXIETY 11/11/2006  . CONSTIPATION 02/23/2008  . GERD 10/06/2007  . HYPERTENSION 11/11/2006  . HYPOKALEMIA, HX OF 02/09/2008  . OSTEOARTHRITIS 11/11/2006   History reviewed. No pertinent past surgical history. No family history on file. History  Substance Use Topics  . Smoking status: Never Smoker   . Smokeless tobacco: Never Used  . Alcohol Use: No   OB History   Grav Para Term Preterm Abortions TAB SAB Ect Mult Living                 Review of Systems  Constitutional: Negative for fever and chills.  Respiratory: Negative.  Negative for cough and shortness of breath.   Cardiovascular: Negative.  Negative for chest pain.  Gastrointestinal: Negative.  Negative for nausea, vomiting and abdominal pain.  Genitourinary: Positive for flank pain. Negative for dysuria.  Musculoskeletal: Positive for back  pain. Negative for neck pain.  Skin: Negative.   Neurological: Negative.       Allergies  Review of patient's allergies indicates no known allergies.  Home Medications   Current Outpatient Rx  Name  Route  Sig  Dispense  Refill  . amLODipine (NORVASC) 10 MG tablet   Oral   Take 10 mg by mouth daily.         . busPIRone (BUSPAR) 15 MG tablet   Oral   Take 7.5 mg by mouth daily.         . metoprolol tartrate (LOPRESSOR) 25 MG tablet   Oral   Take 25 mg by mouth 2 (two) times daily.         Marland Kitchen telmisartan (MICARDIS) 80 MG tablet   Oral   Take 80 mg by mouth daily.          BP 174/82  Pulse 75  Temp(Src) 97.6 F (36.4 C) (Oral)  Resp 18  Wt 194 lb 9 oz (88.253 kg)  SpO2 98% Physical Exam  Constitutional: She is oriented to person, place, and time. She appears well-developed and well-nourished.  HENT:  Head: Normocephalic.  Neck: Normal range of motion. Neck supple.  Cardiovascular: Normal rate and regular rhythm.   Pulmonary/Chest: Effort normal and breath sounds normal. She exhibits no tenderness.  Abdominal: Soft. Bowel sounds are normal. She exhibits no distension. There is no tenderness. There is no rebound and no guarding.  Musculoskeletal: Normal range of motion.  Mildly tender bilateral thoracic tenderness from upper to lower thoracic back. No swelling or discoloration.   Neurological: She is alert and oriented to person, place, and time.  Skin: Skin is warm and dry. No rash noted.  Psychiatric: She has a normal mood and affect.    ED Course  Procedures (including critical care time) Labs Review Labs Reviewed  URINALYSIS, ROUTINE W REFLEX MICROSCOPIC - Abnormal; Notable for the following:    Hgb urine dipstick SMALL (*)    All other components within normal limits  I-STAT CHEM 8, ED - Abnormal; Notable for the following:    Potassium 3.1 (*)    Creatinine, Ser 1.20 (*)    Glucose, Bld 111 (*)    All other components within normal limits   URINE MICROSCOPIC-ADD ON  Rosezena SensorI-STAT TROPOININ, ED   Results for orders placed during the hospital encounter of 07/29/13  URINALYSIS, ROUTINE W REFLEX MICROSCOPIC      Result Value Ref Range   Color, Urine YELLOW  YELLOW   APPearance CLEAR  CLEAR   Specific Gravity, Urine 1.017  1.005 - 1.030   pH 8.0  5.0 - 8.0   Glucose, UA NEGATIVE  NEGATIVE mg/dL   Hgb urine dipstick SMALL (*) NEGATIVE   Bilirubin Urine NEGATIVE  NEGATIVE   Ketones, ur NEGATIVE  NEGATIVE mg/dL   Protein, ur NEGATIVE  NEGATIVE mg/dL   Urobilinogen, UA 0.2  0.0 - 1.0 mg/dL   Nitrite NEGATIVE  NEGATIVE   Leukocytes, UA NEGATIVE  NEGATIVE  URINE MICROSCOPIC-ADD ON      Result Value Ref Range   Squamous Epithelial / LPF RARE  RARE   WBC, UA 0-2  <3 WBC/hpf   RBC / HPF 3-6  <3 RBC/hpf   Bacteria, UA RARE  RARE  I-STAT TROPOININ, ED      Result Value Ref Range   Troponin i, poc 0.00  0.00 - 0.08 ng/mL   Comment 3           I-STAT CHEM 8, ED      Result Value Ref Range   Sodium 143  137 - 147 mEq/L   Potassium 3.1 (*) 3.7 - 5.3 mEq/L   Chloride 100  96 - 112 mEq/L   BUN 20  6 - 23 mg/dL   Creatinine, Ser 7.821.20 (*) 0.50 - 1.10 mg/dL   Glucose, Bld 956111 (*) 70 - 99 mg/dL   Calcium, Ion 2.131.22  0.861.13 - 1.30 mmol/L   TCO2 29  0 - 100 mmol/L   Hemoglobin 14.3  12.0 - 15.0 g/dL   HCT 57.842.0  46.936.0 - 62.946.0 %   No results found.  Imaging Review No results found.   EKG Interpretation None      MDM   Final diagnoses:  None    1. Muscular back pain  EKG, lab studies, CXR negative, pain atypical for cardiac origin - doubt ACS.  Pain is reproducible and worse with movement which correlates with muscular back pain. Stable for discharge home.     Arnoldo HookerShari A Tyronn Golda, PA-C 07/30/13 0149

## 2013-07-30 NOTE — ED Provider Notes (Signed)
Medical screening examination/treatment/procedure(s) were performed by non-physician practitioner and as supervising physician I was immediately available for consultation/collaboration.   EKG Interpretation None        Tracey Stewart, MD 07/30/13 0744 

## 2013-07-30 NOTE — Discharge Instructions (Signed)
Back Pain, Adult Low back pain is very common. About 1 in 5 people have back pain.The cause of low back pain is rarely dangerous. The pain often gets better over time.About half of people with a sudden onset of back pain feel better in just 2 weeks. About 8 in 10 people feel better by 6 weeks.  CAUSES Some common causes of back pain include:  Strain of the muscles or ligaments supporting the spine.  Wear and tear (degeneration) of the spinal discs.  Arthritis.  Direct injury to the back. DIAGNOSIS Most of the time, the direct cause of low back pain is not known.However, back pain can be treated effectively even when the exact cause of the pain is unknown.Answering your caregiver's questions about your overall health and symptoms is one of the most accurate ways to make sure the cause of your pain is not dangerous. If your caregiver needs more information, he or she may order lab work or imaging tests (X-rays or MRIs).However, even if imaging tests show changes in your back, this usually does not require surgery. HOME CARE INSTRUCTIONS For many people, back pain returns.Since low back pain is rarely dangerous, it is often a condition that people can learn to manageon their own.   Remain active. It is stressful on the back to sit or stand in one place. Do not sit, drive, or stand in one place for more than 30 minutes at a time. Take short walks on level surfaces as soon as pain allows.Try to increase the length of time you walk each day.  Do not stay in bed.Resting more than 1 or 2 days can delay your recovery.  Do not avoid exercise or work.Your body is made to move.It is not dangerous to be active, even though your back may hurt.Your back will likely heal faster if you return to being active before your pain is gone.  Pay attention to your body when you bend and lift. Many people have less discomfortwhen lifting if they bend their knees, keep the load close to their bodies,and  avoid twisting. Often, the most comfortable positions are those that put less stress on your recovering back.  Find a comfortable position to sleep. Use a firm mattress and lie on your side with your knees slightly bent. If you lie on your back, put a pillow under your knees.  Only take over-the-counter or prescription medicines as directed by your caregiver. Over-the-counter medicines to reduce pain and inflammation are often the most helpful.Your caregiver may prescribe muscle relaxant drugs.These medicines help dull your pain so you can more quickly return to your normal activities and healthy exercise.  Put ice on the injured area.  Put ice in a plastic bag.  Place a towel between your skin and the bag.  Leave the ice on for 15-20 minutes, 03-04 times a day for the first 2 to 3 days. After that, ice and heat may be alternated to reduce pain and spasms.  Ask your caregiver about trying back exercises and gentle massage. This may be of some benefit.  Avoid feeling anxious or stressed.Stress increases muscle tension and can worsen back pain.It is important to recognize when you are anxious or stressed and learn ways to manage it.Exercise is a great option. SEEK MEDICAL CARE IF:  You have pain that is not relieved with rest or medicine.  You have pain that does not improve in 1 week.  You have new symptoms.  You are generally not feeling well. SEEK   IMMEDIATE MEDICAL CARE IF:   You have pain that radiates from your back into your legs.  You develop new bowel or bladder control problems.  You have unusual weakness or numbness in your arms or legs.  You develop nausea or vomiting.  You develop abdominal pain.  You feel faint. Document Released: 05/19/2005 Document Revised: 11/18/2011 Document Reviewed: 10/07/2010 [employer] Patient Information 2014 [employer].  

## 2013-08-01 ENCOUNTER — Ambulatory Visit (INDEPENDENT_AMBULATORY_CARE_PROVIDER_SITE_OTHER): Payer: Medicare HMO | Admitting: Family Medicine

## 2013-08-01 DIAGNOSIS — M549 Dorsalgia, unspecified: Secondary | ICD-10-CM

## 2013-08-01 DIAGNOSIS — R6889 Other general symptoms and signs: Secondary | ICD-10-CM

## 2013-08-01 DIAGNOSIS — R899 Unspecified abnormal finding in specimens from other organs, systems and tissues: Secondary | ICD-10-CM

## 2013-08-01 DIAGNOSIS — K219 Gastro-esophageal reflux disease without esophagitis: Secondary | ICD-10-CM

## 2013-08-01 LAB — BASIC METABOLIC PANEL
BUN: 17 mg/dL (ref 6–23)
CO2: 34 meq/L — AB (ref 19–32)
CREATININE: 1 mg/dL (ref 0.4–1.2)
Calcium: 9.7 mg/dL (ref 8.4–10.5)
Chloride: 99 mEq/L (ref 96–112)
GFR: 65.93 mL/min (ref >60.00)
GLUCOSE: 68 mg/dL — AB (ref 70–99)
Potassium: 3.7 mEq/L (ref 3.5–5.1)
Sodium: 139 mEq/L (ref 135–145)

## 2013-08-01 LAB — POCT URINALYSIS DIPSTICK
BILIRUBIN UA: NEGATIVE
Glucose, UA: NEGATIVE
KETONES UA: NEGATIVE
Leukocytes, UA: NEGATIVE
Nitrite, UA: NEGATIVE
Spec Grav, UA: 1.02
Urobilinogen, UA: 0.2
pH, UA: 5.5

## 2013-08-01 NOTE — Progress Notes (Signed)
Chief Complaint  Patient presents with  . Hospitalization Follow-up    HPI:  Evelyn Irwin is a 80 yo pt of Dr. Amador Cunas here for a follow up for:  Back Pain: -evaluated in ED a few days ago and dx with musculoskeletal pain and treated with and told to follow up with PCP -pain is more on R but bilat in paraspinal muscles both thoracic and lumbar and worse with bending over -reports she is feeling a lot better in terms of the pain -denies: fevers, chills, urinary symptoms, weakness, numbness -the tramadol has helped -on roc cr a little above baseline on labs done in ED, urine ok except tr blood seen on prior dips/micro K+ chronically low  GERD: -chronically and in the past -thinks took medication for this in the past -worse for last month with belching, acid reflux, heartburn, tums helps some -worse with coffee but she loves coffee  ROS: See pertinent positives and negatives per HPI.  Past Medical History  Diagnosis Date  . ALLERGIC RHINITIS 11/11/2006  . ANXIETY 11/11/2006  . CONSTIPATION 02/23/2008  . GERD 10/06/2007  . HYPERTENSION 11/11/2006  . HYPOKALEMIA, HX OF 02/09/2008  . OSTEOARTHRITIS 11/11/2006    No past surgical history on file.  No family history on file.  History   Social History  . Marital Status: Married    Spouse Name: N/A    Number of Children: N/A  . Years of Education: N/A   Social History Main Topics  . Smoking status: Never Smoker   . Smokeless tobacco: Never Used  . Alcohol Use: No  . Drug Use: No  . Sexual Activity: Not on file   Other Topics Concern  . Not on file   Social History Narrative  . No narrative on file    Current outpatient prescriptions:amLODipine (NORVASC) 10 MG tablet, Take 10 mg by mouth daily., Disp: , Rfl: ;  busPIRone (BUSPAR) 15 MG tablet, Take 7.5 mg by mouth daily., Disp: , Rfl: ;  metoprolol tartrate (LOPRESSOR) 25 MG tablet, Take 25 mg by mouth 2 (two) times daily., Disp: , Rfl: ;  telmisartan (MICARDIS) 80 MG  tablet, Take 80 mg by mouth daily., Disp: , Rfl:  traMADol (ULTRAM) 50 MG tablet, Take 1 tablet (50 mg total) by mouth every 8 (eight) hours., Disp: 24 tablet, Rfl: 0  EXAM:  There were no vitals filed for this visit.  There is no weight on file to calculate BMI.  GENERAL: vitals reviewed and listed above, alert, oriented, appears well hydrated and in no acute distress  HEENT: atraumatic, conjunttiva clear, no obvious abnormalities on inspection of external nose and ears  NECK: no obvious masses on inspection  LUNGS: clear to auscultation bilaterally, no wheezes, rales or rhonchi, good air movement  CV: HRRR, no peripheral edema  MS: moves all extremities without noticeable abnormality Normal Gait Normal inspection of back, no obvious scoliosis or leg length descrepancy No bony TTP Soft tissue TTP at: R paraspinal muscles thoracic  PSYCH: pleasant and cooperative, no obvious depression or anxiety  ASSESSMENT AND PLAN:  Discussed the following assessment and plan:  Abnormal laboratory test - Plan: Basic metabolic panel, Culture, Urine, CANCELED: POC urinalysis w microscopic (non auto)  Back pain - Plan: POCT urinalysis dipstick  GERD  -we discussed possible serious and likely etiologies, workup and treatment, treatment risks and return precautions -repeat BMP, udip and urine culture, follow up with PCP in 1 month  - reports didn't drink much fluids today or the  day she went to ED so this may have caused findings -she is feeling much better and has TTP in paraspinal muscles on exam -low dose prilosec for one month for GERD, follow up with PCP -Patient advised to return or notify a doctor immediately if symptoms worsen or persist or new concerns arise.  Patient Instructions  -start prilosec 20mg  daily for 1 month  -heat for 15 minute twice a day, stretching, get back in the pool and take ultram as needed  -follow up in 3-4 weeks  -We have ordered labs or studies at this  visit. It can take up to 1-2 weeks for results and processing. We will contact you with instructions IF your results are abnormal. Normal results will be released to your [facility]. If you have not heard from us or can not find your results in [facility] in 2 weeks please contact our office.  -follow up with your doctor in 3-4 weeks            Senica Crall R.

## 2013-08-01 NOTE — Patient Instructions (Signed)
-  start prilosec 20mg  daily for 1 month  -heat for 15 minute twice a day, stretching, get back in the pool and take ultram as needed  -follow up in 3-4 weeks  -We have ordered labs or studies at this visit. It can take up to 1-2 weeks for results and processing. We will contact you with instructions IF your results are abnormal. Normal results will be released to your [facility]. If you have not heard from us or can not find your results in [facility] in 2 weeks please contact our office.  -follow up with your doctor in 3-4 weeks

## 2013-08-03 LAB — URINE CULTURE
Colony Count: NO GROWTH
Organism ID, Bacteria: NO GROWTH

## 2013-08-05 ENCOUNTER — Telehealth: Payer: Self-pay | Admitting: Internal Medicine

## 2013-08-05 NOTE — Telephone Encounter (Signed)
Pt is needing new rx traMADol (ULTRAM) 50 MG tablet, sent to [facility], pt has scheduled appointment on 09/14/13 with dr. Kirtland Bouchardk  Pt will run out of meds before the appt.

## 2013-08-08 MED ORDER — TRAMADOL HCL 50 MG PO TABS: 50.0000 mg | ORAL_TABLET | Freq: Three times a day (TID) | ORAL | Status: DC | PRN

## 2013-08-08 NOTE — Telephone Encounter (Signed)
Spoke to pt told her Rx for Tramadol was called into pharmacy for her. Pt verbalized understanding.

## 2013-08-15 ENCOUNTER — Encounter: Payer: Medicare HMO | Admitting: Family Medicine

## 2013-08-15 ENCOUNTER — Telehealth: Payer: Self-pay | Admitting: Family Medicine

## 2013-08-15 NOTE — Progress Notes (Signed)
error    This encounter was created in error - please disregard.

## 2013-08-15 NOTE — Telephone Encounter (Signed)
Spoke with pt and she is feeling better. Pain in back resolved. Now with some constipation and burping and  and tramadol does't help this - has this chronically. Advised stopping tramadol if muscle pain gone and trial of prilosec 20mg  daily. She asked to cancel appt for today and follow up if doesn't help. Advised follow up in 3-4 weeks.

## 2013-08-30 ENCOUNTER — Other Ambulatory Visit: Payer: Self-pay | Admitting: Internal Medicine

## 2013-09-13 ENCOUNTER — Ambulatory Visit: Payer: Medicare HMO | Admitting: Internal Medicine

## 2013-09-14 ENCOUNTER — Ambulatory Visit (INDEPENDENT_AMBULATORY_CARE_PROVIDER_SITE_OTHER): Payer: Medicare HMO | Admitting: Internal Medicine

## 2013-09-14 ENCOUNTER — Telehealth: Payer: Self-pay | Admitting: Internal Medicine

## 2013-09-14 ENCOUNTER — Encounter: Payer: Self-pay | Admitting: Internal Medicine

## 2013-09-14 VITALS — BP 144/80 | HR 71 | Temp 98.5°F | Resp 20 | Ht 61.5 in | Wt 191.0 lb

## 2013-09-14 DIAGNOSIS — I10 Essential (primary) hypertension: Secondary | ICD-10-CM

## 2013-09-14 DIAGNOSIS — F411 Generalized anxiety disorder: Secondary | ICD-10-CM

## 2013-09-14 DIAGNOSIS — K219 Gastro-esophageal reflux disease without esophagitis: Secondary | ICD-10-CM

## 2013-09-14 NOTE — Telephone Encounter (Signed)
Relevant patient education mailed to patient.  

## 2013-09-14 NOTE — Progress Notes (Signed)
Pre-visit discussion using our clinic review tool. No additional management support is needed unless otherwise documented below in the visit note.  

## 2013-09-14 NOTE — Patient Instructions (Signed)
Avoids foods high in acid such as tomatoes citrus juices, and spicy foods.  Avoid eating within two hours of lying down or before exercising.  Do not overheat.  Try smaller more frequent meals.  If symptoms persist, elevate the head of her bed 12 inches while sleeping.  You  may move around, but avoid painful motions and activities.  Apply heat to the sore area for 15 to 20 minutes 3 or 4 times daily for the next two to 3 days.  Zorvolex  1 capsule twice daily  Nexium one capsule daily

## 2013-09-14 NOTE — Progress Notes (Signed)
Subjective:    Patient ID: Evelyn Irwin, female    DOB: 08-15-1933, 80 y.o.   MRN: [id number]  HPI  80 year old patient who has a history of hypertension, anxiety disorder, as well as gastroesophageal reflux disease.  She has had some right flank and especially right upper back discomfort since February 27.  She was seen in the ED, the following day, and radiographs were obtained and were normal.  She describes some excessive belching that was alleviated with Prilosec.  No real aggravating or relieving maneuvers.  She describes as constant discomfort  Past Medical History  Diagnosis Date  . ALLERGIC RHINITIS 11/11/2006  . ANXIETY 11/11/2006  . CONSTIPATION 02/23/2008  . GERD 10/06/2007  . HYPERTENSION 11/11/2006  . HYPOKALEMIA, HX OF 02/09/2008  . OSTEOARTHRITIS 11/11/2006    History   Social History  . Marital Status: Married    Spouse Name: N/A    Number of Children: N/A  . Years of Education: N/A   Occupational History  . Not on file.   Social History Main Topics  . Smoking status: Never Smoker   . Smokeless tobacco: Never Used  . Alcohol Use: No  . Drug Use: No  . Sexual Activity: Not on file   Other Topics Concern  . Not on file   Social History Narrative  . No narrative on file    History reviewed. No pertinent past surgical history.  No family history on file.  No Known Allergies  Current Outpatient Prescriptions on File Prior to Visit  Medication Sig Dispense Refill  . amLODipine (NORVASC) 10 MG tablet TAKE 1 TABLET BY MOUTH DAILY  90 tablet  1  . busPIRone (BUSPAR) 15 MG tablet Take 7.5 mg by mouth daily.      . metoprolol tartrate (LOPRESSOR) 25 MG tablet Take 25 mg by mouth 2 (two) times daily.      Marland Kitchen. telmisartan (MICARDIS) 80 MG tablet Take 80 mg by mouth daily.      . traMADol (ULTRAM) 50 MG tablet Take 1 tablet (50 mg total) by mouth every 8 (eight) hours as needed.  30 tablet  0   No current facility-administered medications on file prior to visit.      BP 144/80  Pulse 71  Temp(Src) 98.5 F (36.9 C) (Oral)  Resp 20  Ht 5' 1.5" (1.562 m)  Wt 191 lb (86.637 kg)  BMI 35.51 kg/m2  SpO2 99%     Review of Systems  Constitutional: Negative.   HENT: Negative for congestion, dental problem, hearing loss, rhinorrhea, sinus pressure, sore throat and tinnitus.   Eyes: Negative for pain, discharge and visual disturbance.  Respiratory: Negative for cough and shortness of breath.   Cardiovascular: Negative for chest pain, palpitations and leg swelling.  Gastrointestinal: Negative for nausea, vomiting, abdominal pain, diarrhea, constipation, blood in stool and abdominal distention.  Genitourinary: Negative for dysuria, urgency, frequency, hematuria, flank pain, vaginal bleeding, vaginal discharge, difficulty urinating, vaginal pain and pelvic pain.  Musculoskeletal: Positive for back pain. Negative for arthralgias, gait problem and joint swelling.  Skin: Negative for rash.  Neurological: Negative for dizziness, syncope, speech difficulty, weakness, numbness and headaches.  Hematological: Negative for adenopathy.  Psychiatric/Behavioral: Negative for behavioral problems, dysphoric mood and agitation. The patient is not nervous/anxious.        Objective:   Physical Exam  Constitutional: She is oriented to person, place, and time. She appears well-developed and well-nourished. No distress.  No distress.  Blood pressure normal Frequent belching during  the exam   HENT:  Head: Normocephalic.  Right Ear: External ear normal.  Left Ear: External ear normal.  Mouth/Throat: Oropharynx is clear and moist.  Eyes: Conjunctivae and EOM are normal. Pupils are equal, round, and reactive to light.  Neck: Normal range of motion. Neck supple. No thyromegaly present.  Cardiovascular: Normal rate, regular rhythm, normal heart sounds and intact distal pulses.   Pulmonary/Chest: Effort normal and breath sounds normal.  Abdominal: Soft. Bowel sounds are  normal. She exhibits no mass. There is no tenderness. There is no rebound and no guarding.  No right upper quadrant tenderness  Musculoskeletal: Normal range of motion.  Twisting at  the waist to the left did tend to aggravate the fullness in the right interscapular region  Lymphadenopathy:    She has no cervical adenopathy.  Neurological: She is alert and oriented to person, place, and time.  Skin: Skin is warm and dry. No rash noted.  Psychiatric: She has a normal mood and affect. Her behavior is normal.          Assessment & Plan:   Right upper back discomfort.  Appears to be more musculoligamentous.  We'll treat symptomatically and observe GERD.  Samples of Nexium, dispensed.   ypertension, stable

## 2013-11-02 ENCOUNTER — Telehealth: Payer: Self-pay | Admitting: Internal Medicine

## 2013-11-02 NOTE — Telephone Encounter (Signed)
Pt states she continues to have gas and now its in her back. Pt is very uncomfortable.  Pt states she has seen dr Kirtland Bouchard for this. Refused to make fup until she she's what dr Kirtland Bouchard advises. Referral? Med? pls advise

## 2013-11-02 NOTE — Telephone Encounter (Signed)
Please advise 

## 2013-11-02 NOTE — Telephone Encounter (Signed)
Add  Maalox plus, 2 tablespoons every 6 hours when necessary

## 2013-11-03 NOTE — Telephone Encounter (Signed)
Spoke to pt told her to add Maalox Plus, 2 tablespoons every 6 hours when necessary per Dr. Kirtland Bouchard. Pt verbalized understanding.

## 2013-12-14 ENCOUNTER — Ambulatory Visit (INDEPENDENT_AMBULATORY_CARE_PROVIDER_SITE_OTHER): Payer: Medicare HMO | Admitting: Internal Medicine

## 2013-12-14 ENCOUNTER — Encounter: Payer: Self-pay | Admitting: Internal Medicine

## 2013-12-14 VITALS — BP 132/80 | HR 58 | Temp 98.2°F | Resp 20 | Ht 61.5 in | Wt 188.0 lb

## 2013-12-14 DIAGNOSIS — R55 Syncope and collapse: Secondary | ICD-10-CM

## 2013-12-14 DIAGNOSIS — I10 Essential (primary) hypertension: Secondary | ICD-10-CM

## 2013-12-14 NOTE — Progress Notes (Signed)
Subjective:    Patient ID: Evelyn Irwin, female    DOB: 06-24-1933, 81 y.o.   MRN: [id number]  HPI  80 year old patient who is seen today in followup.  She states that she has had a recent recurrent sacral episode.  This occurred while she was walking to her mailbox and apparently occurred without warning.  Patient has no recollection of any presyncopal symptoms.  She states that she felt slightly confused when she awoke.  No history of seizures or incontinence.  No history of palpitations The patient was admitted to the hospital in August of 2013 for evaluation of syncope.  This occurred in a similar fashion.  When she was ambulatory in her kitchen and also occurred without warning.  Evaluation included observation unit telemetry setting.  This revealed no arrhythmias.  Head CT and carotid artery.  Doppler studies were normal.  2-D echocardiogram revealed normal ejection fraction of 60-65%.  There was septal, basal hypertrophy, and what was described as dynamic obstruction.  Beta blocker therapy was added to her blood pressure regimen at that time  Past Medical History  Diagnosis Date  . ALLERGIC RHINITIS 11/11/2006  . ANXIETY 11/11/2006  . CONSTIPATION 02/23/2008  . GERD 10/06/2007  . HYPERTENSION 11/11/2006  . HYPOKALEMIA, HX OF 02/09/2008  . OSTEOARTHRITIS 11/11/2006    History   Social History  . Marital Status: Married    Spouse Name: N/A    Number of Children: N/A  . Years of Education: N/A   Occupational History  . Not on file.   Social History Main Topics  . Smoking status: Never Smoker   . Smokeless tobacco: Never Used  . Alcohol Use: No  . Drug Use: No  . Sexual Activity: Not on file   Other Topics Concern  . Not on file   Social History Narrative  . No narrative on file    No past surgical history on file.  No family history on file.  No Known Allergies  Current Outpatient Prescriptions on File Prior to Visit  Medication Sig Dispense Refill  . amLODipine  (NORVASC) 10 MG tablet TAKE 1 TABLET BY MOUTH DAILY  90 tablet  1  . busPIRone (BUSPAR) 15 MG tablet Take 7.5 mg by mouth daily.      . metoprolol tartrate (LOPRESSOR) 25 MG tablet Take 25 mg by mouth 2 (two) times daily.      Marland Kitchen omeprazole (PRILOSEC OTC) 20 MG tablet Take 20 mg by mouth daily.      Marland Kitchen telmisartan (MICARDIS) 80 MG tablet Take 80 mg by mouth daily.       No current facility-administered medications on file prior to visit.    BP 132/80  Pulse 58  Temp(Src) 98.2 F (36.8 C) (Oral)  Resp 20  Ht 5' 1.5" (1.562 m)  Wt 188 lb (85.276 kg)  BMI 34.95 kg/m2  SpO2 98%     Review of Systems  Constitutional: Negative.   HENT: Negative for congestion, dental problem, hearing loss, rhinorrhea, sinus pressure, sore throat and tinnitus.   Eyes: Negative for pain, discharge and visual disturbance.  Respiratory: Negative for cough and shortness of breath.   Cardiovascular: Negative for chest pain, palpitations and leg swelling.  Gastrointestinal: Negative for nausea, vomiting, abdominal pain, diarrhea, constipation, blood in stool and abdominal distention.  Genitourinary: Negative for dysuria, urgency, frequency, hematuria, flank pain, vaginal bleeding, vaginal discharge, difficulty urinating, vaginal pain and pelvic pain.  Musculoskeletal: Negative for arthralgias, gait problem and joint swelling.  Skin:  Negative for rash.  Neurological: Positive for syncope. Negative for dizziness, speech difficulty, weakness, numbness and headaches.  Hematological: Negative for adenopathy.  Psychiatric/Behavioral: Negative for behavioral problems, dysphoric mood and agitation. The patient is not nervous/anxious.        Objective:   Physical Exam  Constitutional: She is oriented to person, place, and time. She appears well-developed and well-nourished.  HENT:  Head: Normocephalic.  Right Ear: External ear normal.  Left Ear: External ear normal.  Mouth/Throat: Oropharynx is clear and moist.    Eyes: Conjunctivae and EOM are normal. Pupils are equal, round, and reactive to light.  Neck: Normal range of motion. Neck supple. No thyromegaly present.  Cardiovascular: Normal rate, regular rhythm, normal heart sounds and intact distal pulses.   Pulmonary/Chest: Effort normal and breath sounds normal.  Abdominal: Soft. Bowel sounds are normal. She exhibits no mass. There is no tenderness.  Musculoskeletal: Normal range of motion.  Lymphadenopathy:    She has no cervical adenopathy.  Neurological: She is alert and oriented to person, place, and time.  Skin: Skin is warm and dry. No rash noted.  Psychiatric: She has a normal mood and affect. Her behavior is normal.          Assessment & Plan:   Syncope.  Basal septal hypertrophy described on prior 2-D echocardiogram as well as dynamic obstruction.  Unclear whether this represents a hypertrophic cardiomyopathy and whether this is a factor with her recurrent syncope.  Will refer to cardiology for further evaluation and will consider event monitor to rule out serious arrhythmia Hypertension stable.  We'll continue present regimen at this time.  Syncope.  Does not sound orthostatic

## 2013-12-14 NOTE — Progress Notes (Signed)
Pre visit review using our clinic review tool, if applicable. No additional management support is needed unless otherwise documented below in the visit note. 

## 2013-12-14 NOTE — Patient Instructions (Addendum)
Limit your sodium (Salt) intake  Please check your blood pressure on a regular basis.  If it is consistently greater than 150/90, please make an office appointment.  Call or return to clinic prn if these symptoms worsen or fail to improve as anticipated.  Cardiology evaluation

## 2014-01-09 ENCOUNTER — Other Ambulatory Visit: Payer: Self-pay | Admitting: Internal Medicine

## 2014-01-27 ENCOUNTER — Encounter: Payer: Self-pay | Admitting: Cardiology

## 2014-01-27 ENCOUNTER — Ambulatory Visit (INDEPENDENT_AMBULATORY_CARE_PROVIDER_SITE_OTHER): Payer: Medicare HMO | Admitting: Cardiology

## 2014-01-27 VITALS — BP 118/84 | HR 64 | Ht 61.0 in | Wt 186.0 lb

## 2014-01-27 DIAGNOSIS — R55 Syncope and collapse: Secondary | ICD-10-CM

## 2014-01-27 DIAGNOSIS — I10 Essential (primary) hypertension: Secondary | ICD-10-CM

## 2014-01-27 NOTE — Patient Instructions (Signed)
The current medical regimen is effective;  continue present plan and medications.  Your physician has requested that you have an echocardiogram. Echocardiography is a painless test that uses sound waves to create images of your heart. It provides your doctor with information about the size and shape of your heart and how well your heart's chambers and valves are working. This procedure takes approximately one hour. There are no restrictions for this procedure.  Your physician has recommended that you wear an event monitor. Event monitors are medical devices that record the heart's electrical activity. Doctors most often [place] these monitors to diagnose arrhythmias. Arrhythmias are problems with the speed or rhythm of the heartbeat. The monitor is a small, portable device. You can wear one while you do your normal daily activities. This is usually used to diagnose what is causing palpitations/syncope (passing out).  Follow up in 2 months with Dr Anne Fu.

## 2014-01-27 NOTE — Progress Notes (Signed)
[address] [place], [place]  [address] Phone: [phone] Fax:  [phone]  Date:  01/27/2014   ID:  Evelyn Irwin, DOB 1934-03-02, MRN [id number]  PCP:  Rogelia Boga, MD   History of Present Illness: Evelyn Irwin is a 80 y.o. female here for evaluation of syncope. She was walking toward [place] and fainted without warning. She does not remember any palpitations, any prodrome. Back in 2013 she was admitted for syncope to the hospital. Similar story. She was walking in her kitchen and fell out. Telemetry showed no arrhythmias. Head CT and carotid arteries were normal. Echocardiogram showed normal EF. She did have basal septal hypertrophy and "dynamic obstruction ". This echocardiogram was from 01/23/12.  For her 2 main syncopal episodes. She did have an episode on Wednesday when eating breakfast, getting her coffee where she felt brief dizziness as she states on the right side of her head, transient, fleeting. No near syncope or syncope.  She has not described any gait instability, bleeding, stroke like symptoms, rashes, fevers, chills, chest pain, shortness of breath.   Wt Readings from Last 3 Encounters:  01/27/14 186 lb (84.369 kg)  12/14/13 188 lb (85.276 kg)  09/14/13 191 lb (86.637 kg)     Past Medical History  Diagnosis Date  . ALLERGIC RHINITIS 11/11/2006  . ANXIETY 11/11/2006  . CONSTIPATION 02/23/2008  . GERD 10/06/2007  . HYPERTENSION 11/11/2006  . HYPOKALEMIA, HX OF 02/09/2008  . OSTEOARTHRITIS 11/11/2006    No past surgical history on file.  Current Outpatient Prescriptions  Medication Sig Dispense Refill  . amLODipine (NORVASC) 10 MG tablet TAKE 1 TABLET BY MOUTH DAILY  90 tablet  1  . busPIRone (BUSPAR) 15 MG tablet TAKE 1/2 TABLET BY MOUTH EVERY DAY  90 tablet  1  . metoprolol tartrate (LOPRESSOR) 25 MG tablet Take 25 mg by mouth 2 (two) times daily.      . Misc Natural Products (ADRENAL PO) Take 1 tablet by mouth daily.      .  Multiple Vitamin (MULTIVITAMIN) tablet Take 1 tablet by mouth daily.      Marland Kitchen omeprazole (PRILOSEC OTC) 20 MG tablet Take 20 mg by mouth daily.      Marland Kitchen telmisartan (MICARDIS) 80 MG tablet Take 80 mg by mouth daily.      Marland Kitchen thiamine (VITAMIN B-1) 100 MG tablet Take 100 mg by mouth daily.       No current facility-administered medications for this visit.    Allergies:   No Known Allergies  Social History:  The patient  reports that she has never smoked. She has never used smokeless tobacco. She reports that she does not drink alcohol or use illicit drugs.   No family history on file. no early family history of coronary artery disease.  ROS:  Please see the history of present illness.    All other systems reviewed and negative.   PHYSICAL EXAM: VS:  BP 118/84  Pulse 64  Ht  (1.549 m)  Wt 186 lb (84.369 kg)  BMI 35.16 kg/m2 Well nourished, well developed, in no acute distress HEENT: normal, [place]/AT, EOMI Neck: no JVD, normal carotid upstroke, no bruit Cardiac:  normal S1, S2; RRR; no murmur Lungs:  clear to auscultation bilaterally, no wheezing, rhonchi or rales Abd: soft, nontender, no hepatomegaly, no bruits Ext: no edema, 2+ distal pulses Skin: warm and dry GU: deferred Neuro: no focal abnormalities noted, AAO x 3  EKG:  08/01/13-sinus rhythm, 64, normal intervals, normal QT interval, normal PR interval. No other abnormalities.   ASSESSMENT AND PLAN:  1. Syncope-concerning history for cardiac arrhythmia. Perhaps sinus arrest. I'm going to check a 30 day event monitor. I will repeat echocardiogram given previous description of dynamic obstruction. I do not appreciate a flow murmur today. If 30 day event monitor is unremarkable, I will strongly consider loop recorder. I've asked her to refrain from driving during this workup. 2. Hypertension-blood pressure well controlled. Medications reviewed. 3. I will followup with her in 2 months.  Signed, Donato Schultz, MD [facility]  01/27/2014  10:30 AM

## 2014-02-07 ENCOUNTER — Encounter (INDEPENDENT_AMBULATORY_CARE_PROVIDER_SITE_OTHER): Payer: Medicare HMO

## 2014-02-07 ENCOUNTER — Telehealth: Payer: Self-pay | Admitting: Cardiology

## 2014-02-07 ENCOUNTER — Ambulatory Visit (HOSPITAL_COMMUNITY): Payer: Medicare HMO | Attending: Cardiology | Admitting: Cardiology

## 2014-02-07 ENCOUNTER — Encounter: Payer: Self-pay | Admitting: *Deleted

## 2014-02-07 DIAGNOSIS — I10 Essential (primary) hypertension: Secondary | ICD-10-CM | POA: Diagnosis not present

## 2014-02-07 DIAGNOSIS — R55 Syncope and collapse: Secondary | ICD-10-CM | POA: Diagnosis present

## 2014-02-07 DIAGNOSIS — R9389 Abnormal findings on diagnostic imaging of other specified body structures: Secondary | ICD-10-CM | POA: Diagnosis not present

## 2014-02-07 NOTE — Telephone Encounter (Signed)
New message     Pt is on amoxicillian and she has an appt this am.  Should she take the amoxicillian before coming or wait until after her appt?

## 2014-02-07 NOTE — Progress Notes (Signed)
Echo performed. 

## 2014-02-07 NOTE — Progress Notes (Signed)
Patient ID: Evelyn Irwin, female   DOB: 1934-02-17, 80 y.o.   MRN: [id number] [employer] 30 day cardiac event monitor applied to patient.

## 2014-02-07 NOTE — Telephone Encounter (Signed)
Advised patient to take her medication as directed -- explained that this would not interfere with echo and monitor she is coming in for today. She verbalized understanding.

## 2014-02-27 ENCOUNTER — Other Ambulatory Visit: Payer: Self-pay | Admitting: Internal Medicine

## 2014-03-29 ENCOUNTER — Telehealth: Payer: Self-pay | Admitting: Internal Medicine

## 2014-03-29 ENCOUNTER — Encounter: Payer: Self-pay | Admitting: Cardiology

## 2014-03-29 ENCOUNTER — Ambulatory Visit (INDEPENDENT_AMBULATORY_CARE_PROVIDER_SITE_OTHER): Payer: Medicare HMO | Admitting: Cardiology

## 2014-03-29 VITALS — BP 132/78 | HR 62 | Ht 60.0 in | Wt 186.0 lb

## 2014-03-29 DIAGNOSIS — R55 Syncope and collapse: Secondary | ICD-10-CM

## 2014-03-29 DIAGNOSIS — R002 Palpitations: Secondary | ICD-10-CM

## 2014-03-29 DIAGNOSIS — I10 Essential (primary) hypertension: Secondary | ICD-10-CM

## 2014-03-29 NOTE — Telephone Encounter (Signed)
Spoke with patient and let her know there are no special instructions prior and we will see her in the office tomorrow

## 2014-03-29 NOTE — Patient Instructions (Addendum)
Your physician recommends that you continue on your current medications as directed. Please refer to the Current Medication list given to you today. Your physician wants you to follow-up in: 6 months with Dr. Anne FuSkains.  You will receive a reminder letter in the mail two months in advance. If you don't receive a letter, please call our office to schedule the follow-up appointment.  Your physician recommends that you schedule a follow-up appointment with Dr. Johney FrameAllred to discuss  [facility].

## 2014-03-29 NOTE — Telephone Encounter (Signed)
°  Patient has questions regarding medication, please call & advise.

## 2014-03-29 NOTE — Progress Notes (Signed)
[address] [place], [place]  [address] Phone: [phone] Fax:  [phone]  Date:  03/29/2014   ID:  Evelyn Irwin, DOB [birth year], MRN [id number]  PCP:  Rogelia BogaKWIATKOWSKI,PETER FRANK, MD   History of Present Illness: Evelyn Irwin is a 80 y.o. female here for follow-up of syncope. Event monitor placed September 2015 which demonstrated no adverse arrhythmias, no tachycardia or bradycardia arrhythmias that would result in syncope. Occasional dizziness/lightheadedness was noted however this was during episodes of sinus rhythm.  Echocardiogram from 02/07/14 demonstrated normal ejection fraction, moderate tricuspid regurgitation with minimally elevated pulmonary pressures. Overall reassuring. No evidence of recurrent dynamic obstruction.  Episode of syncope as follows: She was walking and fainted without warning. She does not remember any palpitations, any prodrome. Back in 2013 she was admitted for syncope to the hospital. Similar story. She was walking in her kitchen and fell out. Telemetry showed no arrhythmias. Head CT and carotid arteries were normal. Echocardiogram showed normal EF. She did have basal septal hypertrophy and "dynamic obstruction ". This echocardiogram was from 01/23/12.  2 main syncopal episodes. She did have an episode on Wednesday when eating breakfast, getting her coffee where she felt brief dizziness as she states on the right side of her head, transient, fleeting. No near syncope or syncope.  She has not had any further episodes but she did feel mild dizziness at times. No syncope. I would like for her to have a linq monitor  She has not described any gait instability, bleeding, stroke like symptoms, rashes, fevers, chills, chest pain, shortness of breath.   Wt Readings from Last 3 Encounters:  03/29/14 186 lb (84.369 kg)  01/27/14 186 lb (84.369 kg)  12/14/13 188 lb (85.276 kg)     Past Medical History  Diagnosis Date  . ALLERGIC RHINITIS  11/11/2006  . ANXIETY 11/11/2006  . CONSTIPATION 02/23/2008  . GERD 10/06/2007  . HYPERTENSION 11/11/2006  . HYPOKALEMIA, HX OF 02/09/2008  . OSTEOARTHRITIS 11/11/2006    No past surgical history on file.  Current Outpatient Prescriptions  Medication Sig Dispense Refill  . amLODipine (NORVASC) 10 MG tablet TAKE 1 TABLET BY MOUTH EVERY DAY  90 tablet  1  . busPIRone (BUSPAR) 15 MG tablet TAKE 1/2 TABLET BY MOUTH EVERY DAY  90 tablet  1  . metoprolol tartrate (LOPRESSOR) 25 MG tablet TAKE 1 TABLET BY MOUTH TWICE A DAY  180 tablet  1  . Misc Natural Products (ADRENAL PO) Take 1 tablet by mouth daily.      . Multiple Vitamin (MULTIVITAMIN) tablet Take 1 tablet by mouth daily.      Marland Kitchen. omeprazole (PRILOSEC OTC) 20 MG tablet Take 20 mg by mouth daily.      Marland Kitchen. telmisartan (MICARDIS) 80 MG tablet TAKE 1 TABLET BY MOUTH EVERY DAY  90 tablet  1  . thiamine (VITAMIN B-1) 100 MG tablet Take 100 mg by mouth daily.       No current facility-administered medications for this visit.    Allergies:   No Known Allergies  Social History:  The patient  reports that she has never smoked. She has never used smokeless tobacco. She reports that she does not drink alcohol or use illicit drugs.   No early family history of coronary artery disease.  ROS:  Please see the history of present illness.    All other systems reviewed and negative.   PHYSICAL EXAM: VS:  BP 132/78  Pulse 62  Ht 5' (1.524 m)  Wt 186 lb (84.369 kg)  BMI 36.33 kg/m2 Well nourished, well developed, in no acute distress HEENT: normal, [place]/AT, EOMI Neck: no JVD, normal carotid upstroke, no bruit Cardiac:  normal S1, S2; RRR; no murmur Lungs:  clear to auscultation bilaterally, no wheezing, rhonchi or rales Abd: soft, nontender, no hepatomegaly, no bruits Ext: no edema, 2+ distal pulses Skin: warm and dry GU: deferred Neuro: no focal abnormalities noted, AAO x 3  EKG:   08/01/13-sinus rhythm, 64, normal intervals, normal QT interval, normal  PR interval. No other abnormalities.   ASSESSMENT AND PLAN:  1. Syncope-concerning history for cardiac arrhythmia. Event monitor was reassuring with no evidence of significant bradycardia or tachycardia. Perhaps sinus arrest. I will have her set up for Linq monitor implantation.  Lengthy discussion with she and her husband. 2. Hypertension-blood pressure well controlled. Medications reviewed. 3. I will followup with her in 6 months.  Signed, Donato SchultzMark Skains, MD [facility]  03/29/2014 9:43 AM

## 2014-03-30 ENCOUNTER — Other Ambulatory Visit: Payer: Self-pay | Admitting: Internal Medicine

## 2014-03-30 ENCOUNTER — Encounter: Payer: Self-pay | Admitting: Internal Medicine

## 2014-03-30 ENCOUNTER — Encounter (HOSPITAL_COMMUNITY)
Admission: AD | Disposition: A | Discharge status: Home / Self Care | Payer: Self-pay | Source: Ambulatory Visit | Attending: Internal Medicine

## 2014-03-30 ENCOUNTER — Ambulatory Visit (INDEPENDENT_AMBULATORY_CARE_PROVIDER_SITE_OTHER): Payer: Medicare HMO | Admitting: Internal Medicine

## 2014-03-30 ENCOUNTER — Ambulatory Visit (HOSPITAL_COMMUNITY)
Admission: AD | Admit: 2014-03-30 | Discharge: 2014-03-30 | Disposition: A | Discharge status: Home / Self Care | Payer: Medicare HMO | Source: Ambulatory Visit | Attending: Internal Medicine | Admitting: Internal Medicine

## 2014-03-30 VITALS — BP 142/78 | HR 61 | Ht 60.0 in | Wt 186.8 lb

## 2014-03-30 DIAGNOSIS — R55 Syncope and collapse: Secondary | ICD-10-CM | POA: Insufficient documentation

## 2014-03-30 DIAGNOSIS — R4020 Unspecified coma: Secondary | ICD-10-CM

## 2014-03-30 DIAGNOSIS — R404 Transient alteration of awareness: Secondary | ICD-10-CM

## 2014-03-30 DIAGNOSIS — Z79899 Other long term (current) drug therapy: Secondary | ICD-10-CM | POA: Diagnosis not present

## 2014-03-30 DIAGNOSIS — I10 Essential (primary) hypertension: Secondary | ICD-10-CM

## 2014-03-30 HISTORY — PX: LOOP RECORDER IMPLANT: SHX5477

## 2014-03-30 SURGERY — LOOP RECORDER IMPLANT
Anesthesia: LOCAL

## 2014-03-30 MED ORDER — LIDOCAINE-EPINEPHRINE 1 %-1:100000 IJ SOLN
INTRAMUSCULAR | Status: AC
  Filled 2014-03-30: qty 1

## 2014-03-30 NOTE — Patient Instructions (Signed)
Dr. Johney FrameAllred is going to implant a LINQ (loop recorder) in you today.  - [facility] - check in at the volunteer desk and they will direct you to the Short Stay area   Your physician recommends that you schedule a follow-up appointment in: 10 days- with the device clinic- wound check.  Your physician recommends that you schedule a follow-up appointment in: 3 months with Dr. Anne FuSkains

## 2014-03-30 NOTE — Progress Notes (Signed)
Primary Care Physician: Rogelia BogaKWIATKOWSKI,PETER FRANK, MD Referring Physician:Dr. Laurita QuintSkaines   Evelyn Irwin is a 80 y.o. female with a h/o syncope x 2 episodes this year,last episode in August. Pt is usually walking, and passes out without prodrome. Wore an event monitor without significant arrythmia. Here  today for evaluation for ILR. Denies family history of sudden death or relatives with ICD. Ekg today without abnormalities associated with syncope.  She is unaware of triggers/ precipitants for her arrhythmia.   Today, she denies symptoms of palpitations, chest pain, shortness of breath, orthopnea, PND, lower extremity edema, dizziness,  or neurologic sequela. The patient is tolerating medications without difficulties and is otherwise without complaint today.   Past Medical History  Diagnosis Date  . ALLERGIC RHINITIS 11/11/2006  . ANXIETY 11/11/2006  . CONSTIPATION 02/23/2008  . GERD 10/06/2007  . HYPERTENSION 11/11/2006  . HYPOKALEMIA, HX OF 02/09/2008  . OSTEOARTHRITIS 11/11/2006   History reviewed. No pertinent past surgical history.  Current Outpatient Prescriptions  Medication Sig Dispense Refill  . amLODipine (NORVASC) 10 MG tablet TAKE 1 TABLET BY MOUTH EVERY DAY  90 tablet  1  . busPIRone (BUSPAR) 15 MG tablet TAKE 1/2 TABLET BY MOUTH EVERY DAY  90 tablet  1  . metoprolol tartrate (LOPRESSOR) 25 MG tablet TAKE 1 TABLET BY MOUTH TWICE A DAY  180 tablet  1  . Misc Natural Products (ADRENAL PO) Take 1 tablet by mouth daily.      . Multiple Vitamin (MULTIVITAMIN) tablet Take 1 tablet by mouth daily.      Marland Kitchen. omeprazole (PRILOSEC OTC) 20 MG tablet Take 20 mg by mouth daily as needed (acid reflux).       Marland Kitchen. telmisartan (MICARDIS) 80 MG tablet TAKE 1 TABLET BY MOUTH EVERY DAY  90 tablet  1  . thiamine (VITAMIN B-1) 100 MG tablet Take 100 mg by mouth daily.       No current facility-administered medications for this visit.    No Known Allergies  History   Social History  . Marital Status:  Married    Spouse Name: N/A    Number of Children: N/A  . Years of Education: N/A   Occupational History  . Not on file.   Social History Main Topics  . Smoking status: Never Smoker   . Smokeless tobacco: Never Used  . Alcohol Use: No  . Drug Use: No  . Sexual Activity: Not on file   Other Topics Concern  . Not on file   Social History Narrative  . No narrative on file    Family History  Problem Relation Age of Onset  . Stroke Mother     ROS- All systems are reviewed and negative except as per the HPI above  Physical Exam: Filed Vitals:   03/30/14 1334  BP: 142/78  Pulse: 61  Height: 5' (1.524 m)  Weight: 186 lb 12.8 oz (84.732 kg)    GEN- The patient is well appearing, alert and oriented x 3 today.   Head- normocephalic, atraumatic Eyes-  Sclera clear, conjunctiva pink Ears- hearing intact Oropharynx- clear Neck- supple  Carotid massage maneuver today is negative Lungs- Clear to ausculation bilaterally, normal work of breathing Heart- Regular rate and rhythm, no murmurs, rubs or gallops, PMI not laterally displaced GI- soft, NT, ND, + BS Extremities- no clubbing, cyanosis, or edema MS- no significant deformity or atrophy Skin- no rash or lesion Psych- euthymic mood, full affect Neuro- strength and sensation are intact  ekg today is reviewed and  reveals sinus rhythm, normal intervals Echo is reviewed and reveals no structural abnormality Epic records including Dr Anne FuSkains notes are reviewed More than 50 pages of her event monitor is reviewed and reveals no arrhythmia  A/P Recurrent unexplaned syncope The patient has had recurrent abrupt onset of syncope without identifiable cause.  I agree with Dr Anne FuSkains that I am concerned for an arrhythmia as the cause for her syncope.  Her EKG and echo however are relatively low risk.  She wore an event monitor recently which was normal but during which she did not have syncope.   I spoke at length with the patient about  monitoring for afib with an implantable loop recorder.  Risks, benefits, and alteratives to implantable loop recorder were discussed with the patient today.   At this time, the patient is very clear in their decision to proceed with implantable loop recorder.   I think that it is important to establish the cause of her syncope by performing this EP test as she is at high risk for recurrent syncope which could be accompanied by major injury or even sudden death.  I have advised no driving x 6 months from last episode of syncope. 2. Hypertensive cardiovascular disease Stable No change required today  We will proceed with ILR placement at this time.  I will follow for arrhythmias remotely She will follow-up with Dr Anne FuSkains and I will see in the office as needed after her 10 day wound check.

## 2014-03-30 NOTE — Interval H&P Note (Signed)
History and Physical Interval Note:  03/30/2014 3:35 PM  Evelyn Irwin  has presented today for surgery, with the diagnosis of syncope  The various methods of treatment have been discussed with the patient and family. After consideration of risks, benefits and other options for treatment, the patient has consented to  Procedure(s): LOOP RECORDER IMPLANT (N/A) as a surgical intervention .  The patient's history has been reviewed, patient examined, no change in status, stable for surgery.  I have reviewed the patient's chart and labs.  Questions were answered to the patient's satisfaction.     Hillis RangeAllred, Laurine Kuyper

## 2014-03-30 NOTE — CV Procedure (Signed)
SURGEON:  Hillis RangeJames Cadell Gabrielson, MD     PREPROCEDURE DIAGNOSIS:  Unexplained syncope    POSTPROCEDURE DIAGNOSIS:  Unexplained syncope     PROCEDURES:   1. Implantable loop recorder implantation    INTRODUCTION:  Evelyn Irwin is a 80 y.o. female with a history of recurrent unexplained syncope who presents today for implantable loop implantation.  Despite an extensive workup, no reversible causes have been identified.  she has worn telemetry during which she did not have arrhythmias.  There is significant concern for arrhythmia as the cause for the patients syncope.  The patient therefore presents today for implantable loop implantation.     DESCRIPTION OF PROCEDURE:  Informed written consent was obtained, and the patient was brought to the electrophysiology lab in a fasting state.  The patient required no sedation for the procedure today.  Mapping over the patient's chest was performed by the EP lab staff to identify the area where electrograms were most prominent for ILR recording.  This area was found to be the left parasternal region over the 3rd-4th intercostal space. The patients left chest was therefore prepped and draped in the usual sterile fashion by the EP lab staff. The skin overlying the left parasternal region was infiltrated with lidocaine for local analgesia.  A 0.5-cm incision was made over the left parasternal region over the 3rd intercostal space.  A subcutaneous ILR pocket was fashioned using a combination of sharp and blunt dissection.  A Medtronic Reveal SyracuseLinq model [id number] S implantable loop recorder was then placed into the pocket  R waves were very prominent and measured 0.384mV.EBL<5510ml.  Steri- Strips and a sterile dressing were then applied.  There were no early apparent complications.     CONCLUSIONS:   1. Successful implantation of a Medtronic Reveal LINQ implantable loop recorder for recurrent unexplained syncope  2. No early apparent complications.    No driving x 6  months following a syncopal episode as per DMV's recommendation

## 2014-03-30 NOTE — H&P (View-Only) (Signed)
Primary Care Physician: KWIATKOWSKI,PETER FRANK, MD Referring Physician:Dr. Skaines   Evelyn Irwin is a 80 y.o. female with a h/o syncope x 2 episodes this year,last episode in August. Pt is usually walking, and passes out without prodrome. Wore an event monitor without significant arrythmia. Here  today for evaluation for ILR. Denies family history of sudden death or relatives with ICD. Ekg today without abnormalities associated with syncope.  She is unaware of triggers/ precipitants for her arrhythmia.   Today, she denies symptoms of palpitations, chest pain, shortness of breath, orthopnea, PND, lower extremity edema, dizziness,  or neurologic sequela. The patient is tolerating medications without difficulties and is otherwise without complaint today.   Past Medical History  Diagnosis Date  . ALLERGIC RHINITIS 11/11/2006  . ANXIETY 11/11/2006  . CONSTIPATION 02/23/2008  . GERD 10/06/2007  . HYPERTENSION 11/11/2006  . HYPOKALEMIA, HX OF 02/09/2008  . OSTEOARTHRITIS 11/11/2006   History reviewed. No pertinent past surgical history.  Current Outpatient Prescriptions  Medication Sig Dispense Refill  . amLODipine (NORVASC) 10 MG tablet TAKE 1 TABLET BY MOUTH EVERY DAY  90 tablet  1  . busPIRone (BUSPAR) 15 MG tablet TAKE 1/2 TABLET BY MOUTH EVERY DAY  90 tablet  1  . metoprolol tartrate (LOPRESSOR) 25 MG tablet TAKE 1 TABLET BY MOUTH TWICE A DAY  180 tablet  1  . Misc Natural Products (ADRENAL PO) Take 1 tablet by mouth daily.      . Multiple Vitamin (MULTIVITAMIN) tablet Take 1 tablet by mouth daily.      . omeprazole (PRILOSEC OTC) 20 MG tablet Take 20 mg by mouth daily as needed (acid reflux).       . telmisartan (MICARDIS) 80 MG tablet TAKE 1 TABLET BY MOUTH EVERY DAY  90 tablet  1  . thiamine (VITAMIN B-1) 100 MG tablet Take 100 mg by mouth daily.       No current facility-administered medications for this visit.    No Known Allergies  History   Social History  . Marital Status:  Married    Spouse Name: N/A    Number of Children: N/A  . Years of Education: N/A   Occupational History  . Not on file.   Social History Main Topics  . Smoking status: Never Smoker   . Smokeless tobacco: Never Used  . Alcohol Use: No  . Drug Use: No  . Sexual Activity: Not on file   Other Topics Concern  . Not on file   Social History Narrative  . No narrative on file    Family History  Problem Relation Age of Onset  . Stroke Mother     ROS- All systems are reviewed and negative except as per the HPI above  Physical Exam: Filed Vitals:   03/30/14 1334  BP: 142/78  Pulse: 61  Height: 5' (1.524 m)  Weight: 186 lb 12.8 oz (84.732 kg)    GEN- The patient is well appearing, alert and oriented x 3 today.   Head- normocephalic, atraumatic Eyes-  Sclera clear, conjunctiva pink Ears- hearing intact Oropharynx- clear Neck- supple  Carotid massage maneuver today is negative Lungs- Clear to ausculation bilaterally, normal work of breathing Heart- Regular rate and rhythm, no murmurs, rubs or gallops, PMI not laterally displaced GI- soft, NT, ND, + BS Extremities- no clubbing, cyanosis, or edema MS- no significant deformity or atrophy Skin- no rash or lesion Psych- euthymic mood, full affect Neuro- strength and sensation are intact  ekg today is reviewed and   reveals sinus rhythm, normal intervals Echo is reviewed and reveals no structural abnormality Epic records including Dr Anne FuSkains notes are reviewed More than 50 pages of her event monitor is reviewed and reveals no arrhythmia  A/P Recurrent unexplaned syncope The patient has had recurrent abrupt onset of syncope without identifiable cause.  I agree with Dr Anne FuSkains that I am concerned for an arrhythmia as the cause for her syncope.  Her EKG and echo however are relatively low risk.  She wore an event monitor recently which was normal but during which she did not have syncope.   I spoke at length with the patient about  monitoring for afib with an implantable loop recorder.  Risks, benefits, and alteratives to implantable loop recorder were discussed with the patient today.   At this time, the patient is very clear in their decision to proceed with implantable loop recorder.   I think that it is important to establish the cause of her syncope by performing this EP test as she is at high risk for recurrent syncope which could be accompanied by major injury or even sudden death.  I have advised no driving x 6 months from last episode of syncope. 2. Hypertensive cardiovascular disease Stable No change required today  We will proceed with ILR placement at this time.  I will follow for arrhythmias remotely She will follow-up with Dr Anne FuSkains and I will see in the office as needed after her 10 day wound check.

## 2014-04-10 ENCOUNTER — Ambulatory Visit (INDEPENDENT_AMBULATORY_CARE_PROVIDER_SITE_OTHER): Payer: Medicare HMO | Admitting: *Deleted

## 2014-04-10 DIAGNOSIS — R55 Syncope and collapse: Secondary | ICD-10-CM

## 2014-04-10 LAB — MDC_IDC_ENUM_SESS_TYPE_INCLINIC

## 2014-04-10 NOTE — Progress Notes (Signed)
Wound check-ILR.  Steri strips remoted.  Wound without redness or edema.  R-waves 0.32-0.34mV.  No episodes noted.  ROV in February with Dr. Johney FrameAllred.

## 2014-04-18 ENCOUNTER — Encounter: Payer: Self-pay | Admitting: Internal Medicine

## 2014-04-30 LAB — MDC_IDC_ENUM_SESS_TYPE_REMOTE
Date Time Interrogation Session: 20151129123103
MDC IDC SET ZONE DETECTION INTERVAL: 3000 ms
Zone Setting Detection Interval: 2000 ms
Zone Setting Detection Interval: 390 ms

## 2014-05-01 ENCOUNTER — Ambulatory Visit (INDEPENDENT_AMBULATORY_CARE_PROVIDER_SITE_OTHER): Payer: Medicare HMO | Admitting: *Deleted

## 2014-05-01 DIAGNOSIS — R55 Syncope and collapse: Secondary | ICD-10-CM

## 2014-05-05 NOTE — Progress Notes (Signed)
Loop recorder 

## 2014-05-11 ENCOUNTER — Encounter (HOSPITAL_COMMUNITY): Payer: Self-pay | Admitting: Internal Medicine

## 2014-05-30 ENCOUNTER — Ambulatory Visit (INDEPENDENT_AMBULATORY_CARE_PROVIDER_SITE_OTHER): Payer: Medicare HMO | Admitting: *Deleted

## 2014-05-30 ENCOUNTER — Telehealth: Payer: Self-pay | Admitting: Cardiology

## 2014-05-30 DIAGNOSIS — R55 Syncope and collapse: Secondary | ICD-10-CM

## 2014-05-30 NOTE — Telephone Encounter (Signed)
New problem    Pt has monitor and it wasn't working properly and she called medtronic and was advised she didn't have to transmit b/c it transmits on its on. Patient wanted to let someone know.

## 2014-05-30 NOTE — Telephone Encounter (Signed)
Informed pt that transmission are being received. Pt verbalized understanding.

## 2014-05-31 NOTE — Progress Notes (Signed)
Loop recorder 

## 2014-06-05 ENCOUNTER — Encounter: Payer: Self-pay | Admitting: Internal Medicine

## 2014-06-16 ENCOUNTER — Ambulatory Visit (INDEPENDENT_AMBULATORY_CARE_PROVIDER_SITE_OTHER): Payer: Medicare HMO | Admitting: Internal Medicine

## 2014-06-16 ENCOUNTER — Encounter: Payer: Self-pay | Admitting: Internal Medicine

## 2014-06-16 VITALS — BP 132/80 | HR 64 | Temp 98.0°F | Resp 20 | Ht 60.0 in | Wt 188.0 lb

## 2014-06-16 DIAGNOSIS — I10 Essential (primary) hypertension: Secondary | ICD-10-CM

## 2014-06-16 DIAGNOSIS — R55 Syncope and collapse: Secondary | ICD-10-CM

## 2014-06-16 LAB — MDC_IDC_ENUM_SESS_TYPE_REMOTE
MDC IDC SESS DTM: 20151228124415
Zone Setting Detection Interval: 2000 ms
Zone Setting Detection Interval: 3000 ms
Zone Setting Detection Interval: 390 ms

## 2014-06-16 NOTE — Progress Notes (Signed)
Pre visit review using our clinic review tool, if applicable. No additional management support is needed unless otherwise documented below in the visit note. 

## 2014-06-16 NOTE — Progress Notes (Signed)
Subjective:    Patient ID: Evelyn Irwin, female    DOB: 1933-07-15, 81 y.o.   MRN: [id number]  HPI  81 year old patient who is seen today for follow-up.  She has a history of essential hypertension.  She also has a history of recurrent syncope.  She has been evaluated by cardiology for possible cardiac syncope and is status post ILR in October of last year.  Her last episode of syncope was in August of last year.  Her last episode was precipitated by a slight increase in her chronic tinnitus and was also associated with approximately 60 seconds of dark vision.  There has been some brief confusion following her syncope.  She does describe an episode in 2010 or 2011, when she developed abrupt sleepiness and felt quickly into a deep sleep.  When she awoke she was quite confused that lasted several minutes.  No history of incontinence.  She did have a head CT in 2013, but no other imaging studies area.  No EEG  Past Medical History  Diagnosis Date  . ALLERGIC RHINITIS 11/11/2006  . ANXIETY 11/11/2006  . CONSTIPATION 02/23/2008  . GERD 10/06/2007  . HYPERTENSION 11/11/2006  . HYPOKALEMIA, HX OF 02/09/2008  . OSTEOARTHRITIS 11/11/2006    History   Social History  . Marital Status: Married    Spouse Name: N/A    Number of Children: N/A  . Years of Education: N/A   Occupational History  . Not on file.   Social History Main Topics  . Smoking status: Never Smoker   . Smokeless tobacco: Never Used  . Alcohol Use: No  . Drug Use: No  . Sexual Activity: Not on file   Other Topics Concern  . Not on file   Social History Narrative    Past Surgical History  Procedure Laterality Date  . Loop recorder implant N/A 03/30/2014    Procedure: LOOP RECORDER IMPLANT;  Surgeon: Gardiner RhymeJames D Allred, MD;  Location: [facility]ATH [facility];  Service: Cardiovascular;  Laterality: N/A;    Family History  Problem Relation Age of Onset  . Stroke Mother     No Known Allergies  Current Outpatient Prescriptions on File  Prior to Visit  Medication Sig Dispense Refill  . amLODipine (NORVASC) 10 MG tablet TAKE 1 TABLET BY MOUTH EVERY DAY 90 tablet 1  . busPIRone (BUSPAR) 15 MG tablet TAKE 1/2 TABLET BY MOUTH EVERY DAY 90 tablet 1  . metoprolol tartrate (LOPRESSOR) 25 MG tablet TAKE 1 TABLET BY MOUTH TWICE A DAY 180 tablet 1  . Misc Natural Products (ADRENAL PO) Take 1 tablet by mouth daily.    . Multiple Vitamin (MULTIVITAMIN) tablet Take 1 tablet by mouth daily.    Marland Kitchen. telmisartan (MICARDIS) 80 MG tablet TAKE 1 TABLET BY MOUTH EVERY DAY 90 tablet 1  . thiamine (VITAMIN B-1) 100 MG tablet Take 100 mg by mouth daily.     No current facility-administered medications on file prior to visit.    BP 132/80 mmHg  Pulse 64  Temp(Src) 98 F (36.7 C) (Oral)  Resp 20  Ht 5' (1.524 m)  Wt 188 lb (85.276 kg)  BMI 36.72 kg/m2  SpO2 98%     Review of Systems  Constitutional: Negative.   HENT: Negative for congestion, dental problem, hearing loss, rhinorrhea, sinus pressure, sore throat and tinnitus.   Eyes: Negative for pain, discharge and visual disturbance.  Respiratory: Negative for cough and shortness of breath.   Cardiovascular: Negative for chest pain, palpitations and  leg swelling.  Gastrointestinal: Negative for nausea, vomiting, abdominal pain, diarrhea, constipation, blood in stool and abdominal distention.  Genitourinary: Negative for dysuria, urgency, frequency, hematuria, flank pain, vaginal bleeding, vaginal discharge, difficulty urinating, vaginal pain and pelvic pain.  Musculoskeletal: Negative for joint swelling, arthralgias and gait problem.  Skin: Negative for rash.  Neurological: Positive for syncope. Negative for dizziness, speech difficulty, weakness, numbness and headaches.  Hematological: Negative for adenopathy.  Psychiatric/Behavioral: Negative for behavioral problems, dysphoric mood and agitation. The patient is not nervous/anxious.        Objective:   Physical Exam    Constitutional: She is oriented to person, place, and time. She appears well-developed and well-nourished.  HENT:  Head: Normocephalic.  Right Ear: External ear normal.  Left Ear: External ear normal.  Mouth/Throat: Oropharynx is clear and moist.  Eyes: Conjunctivae and EOM are normal. Pupils are equal, round, and reactive to light.  Neck: Normal range of motion. Neck supple. No thyromegaly present.  Cardiovascular: Normal rate, regular rhythm, normal heart sounds and intact distal pulses.   Pulmonary/Chest: Effort normal and breath sounds normal.  Abdominal: Soft. Bowel sounds are normal. She exhibits no mass. There is no tenderness.  Musculoskeletal: Normal range of motion.  Lymphadenopathy:    She has no cervical adenopathy.  Neurological: She is alert and oriented to person, place, and time.  Skin: Skin is warm and dry. No rash noted.  Psychiatric: She has a normal mood and affect. Her behavior is normal.          Assessment & Plan:   Recurrent syncope.  Possible seizure disorder.  Will check a sleep deprived EEG and referred to neurology.  Neuro exam is nonfocal.  Will consider further neuro imaging Hypertension, stable

## 2014-06-16 NOTE — Patient Instructions (Signed)
EEG and neurology consultation as discussed  Limit your sodium (Salt) intake    It is important that you exercise regularly, at least 20 minutes 3 to 4 times per week.  If you develop chest pain or shortness of breath seek  medical attention.  You need to lose weight.  Consider a lower calorie diet and regular exercise.

## 2014-06-20 ENCOUNTER — Ambulatory Visit: Payer: Medicare HMO | Admitting: Cardiology

## 2014-06-23 ENCOUNTER — Ambulatory Visit: Payer: Medicare HMO | Admitting: Cardiology

## 2014-06-27 ENCOUNTER — Encounter: Payer: Self-pay | Admitting: Neurology

## 2014-06-27 ENCOUNTER — Ambulatory Visit (INDEPENDENT_AMBULATORY_CARE_PROVIDER_SITE_OTHER): Payer: Medicare HMO | Admitting: Neurology

## 2014-06-27 VITALS — BP 126/70 | HR 67 | Resp 16 | Ht 62.0 in | Wt 186.0 lb

## 2014-06-27 DIAGNOSIS — R55 Syncope and collapse: Secondary | ICD-10-CM

## 2014-06-27 DIAGNOSIS — R413 Other amnesia: Secondary | ICD-10-CM

## 2014-06-27 DIAGNOSIS — R404 Transient alteration of awareness: Secondary | ICD-10-CM

## 2014-06-27 NOTE — Progress Notes (Addendum)
NEUROLOGY CONSULTATION NOTE  Evelyn Irwin MRN: [id number] DOB: Sep 24, 1933  Referring provider: Dr. Eleonore Chiquito Primary care provider: Dr. Eleonore Chiquito  Reason for consult:  Episodes of loss of consciousness  Dear Dr Amador Cunas:  Thank you for your kind referral of Evelyn Irwin for consultation of the above symptoms. Although her history is well known to you, please allow me to reiterate it for the purpose of our medical record. The patient was accompanied to the clinic by her husband who also provides collateral information. Records and images were personally reviewed where available.  HISTORY OF PRESENT ILLNESS: This is a 81 year old right-handed woman with a history of hypertension presenting with recurrent syncopal episodes. The first episode occurred in 2013 when she woke up on the kitchen floor confused. She was admitted to [facility] where workup was unrevealing. Echo had shown basal septal hypertrophy and dynamic obstruction. She reports had another episode last August 2015 where she woke up on the grass, again feeling confused after. No tongue bite or urinary incontinence. She has chronic tinnitus and reports that the ringing in her ears may have gotten a little louder. She denied feeling dizzy or diaphoretic prior to the episodes. She has had an ILR since October with no recurrence of symptoms.   She denies any gaps in time, staring/unresponsive episodes, olfactory/gustatory hallucinations, deja vu, rising epigastric sensation, focal numbness/tingling/weakness, myoclonic jerks. She and her husband recall a curious episode in 2010 or 2011 when she suddenly felt sleepy and fell across the bed. When she got up she did not know where and who she was, did not recognize their kitchen. She went back to sleep and woke up fine. No similar episodes since 2010.  She reports one time she felt the She feels that since the syncopal episode in August, she does not remember things like she  used to. They were on a cruise last November, and she forgot they were onboard a ship. She would move things around in their house and they cannot find them. She has only missed 1 bill payment, denies missing medications. No family history of similar symptoms. She denies any headaches, diplopia, dysarthria, dysphagia, neck/back pain, bowel/bladder dysfunction.   She had a normal birth and early development.  There is no history of febrile convulsions, CNS infections such as meningitis/encephalitis, significant traumatic brain injury, neurosurgical procedures, or family history of seizures.  PAST MEDICAL HISTORY: Past Medical History  Diagnosis Date  . ALLERGIC RHINITIS 11/11/2006  . ANXIETY 11/11/2006  . CONSTIPATION 02/23/2008  . GERD 10/06/2007  . HYPERTENSION 11/11/2006  . HYPOKALEMIA, HX OF 02/09/2008  . OSTEOARTHRITIS 11/11/2006    PAST SURGICAL HISTORY: Past Surgical History  Procedure Laterality Date  . Loop recorder implant N/A 03/30/2014    MDT LINQ implanted by Dr Johney Frame for syncope  . Total knee arthroplasty      Right    MEDICATIONS: Current Outpatient Prescriptions on File Prior to Visit  Medication Sig Dispense Refill  . amLODipine (NORVASC) 10 MG tablet TAKE 1 TABLET BY MOUTH EVERY DAY 90 tablet 1  . busPIRone (BUSPAR) 15 MG tablet TAKE 1/2 TABLET BY MOUTH EVERY DAY 90 tablet 1  . metoprolol tartrate (LOPRESSOR) 25 MG tablet TAKE 1 TABLET BY MOUTH TWICE A DAY 180 tablet 1  . Misc Natural Products (ADRENAL PO) Take 1 tablet by mouth daily as needed (SINUSES).     . Multiple Vitamin (MULTIVITAMIN) tablet Take 1 tablet by mouth daily.    Marland Kitchen telmisartan (MICARDIS) 80  MG tablet TAKE 1 TABLET BY MOUTH EVERY DAY 90 tablet 1  . thiamine (VITAMIN B-1) 100 MG tablet Take 100 mg by mouth daily.     No current facility-administered medications on file prior to visit.    ALLERGIES: No Known Allergies  FAMILY HISTORY: Family History  Problem Relation Age of Onset  . Stroke Mother      SOCIAL HISTORY: History   Social History  . Marital Status: Married    Spouse Name: N/A    Number of Children: N/A  . Years of Education: N/A   Occupational History  . Not on file.   Social History Main Topics  . Smoking status: Never Smoker   . Smokeless tobacco: Never Used  . Alcohol Use: No  . Drug Use: No  . Sexual Activity: Not on file   Other Topics Concern  . Not on file   Social History Narrative    REVIEW OF SYSTEMS: Constitutional: No fevers, chills, or sweats, no generalized fatigue, change in appetite Eyes: No visual changes, double vision, eye pain Ear, nose and throat: No hearing loss, ear pain, nasal congestion, sore throat Cardiovascular: No chest pain, palpitations Respiratory:  No shortness of breath at rest or with exertion, wheezes GastrointestinaI: No nausea, vomiting, diarrhea, abdominal pain, fecal incontinence Genitourinary:  No dysuria, urinary retention or frequency Musculoskeletal:  No neck pain, back pain Integumentary: No rash, pruritus, skin lesions Neurological: as above Psychiatric: No depression, insomnia, anxiety Endocrine: No palpitations, fatigue, diaphoresis, mood swings, change in appetite, change in weight, increased thirst Hematologic/Lymphatic:  No anemia, purpura, petechiae. Allergic/Immunologic: no itchy/runny eyes, nasal congestion, recent allergic reactions, rashes  PHYSICAL EXAM: Filed Vitals:   06/27/14 1325  BP: 126/70  Pulse: 67  Resp: 16   General: No acute distress Head:  Normocephalic/atraumatic Eyes: Fundoscopic exam shows bilateral sharp discs, no vessel changes, exudates, or hemorrhages Neck: supple, no paraspinal tenderness, full range of motion Back: No paraspinal tenderness Heart: regular rate and rhythm Lungs: Clear to auscultation bilaterally. Vascular: No carotid bruits. Skin/Extremities: No rash, no edema Neurological Exam: Mental status: alert and oriented to person, place, and time, no  dysarthria or aphasia, Fund of knowledge is appropriate.  Remote memory intact. 0/3 delayed recall.  Attention and concentration are normal.    Able to name objects and repeat phrases. MMSE - Mini Mental State Exam 06/27/2014  Orientation to time 4  Orientation to Place 5  Registration 3  Attention/ Calculation 5  Recall 0  Language- name 2 objects 2  Language- repeat 1  Language- follow 3 step command 3  Language- read & follow direction 1  Write a sentence 1  Copy design 1  Total score 26   Cranial nerves: CN I: not tested CN II: pupils equal, round and reactive to light, visual fields intact, fundi unremarkable. CN III, IV, VI:  full range of motion, no nystagmus, no ptosis CN V: facial sensation intact CN VII: upper and lower face symmetric CN VIII: hearing intact to finger rub CN IX, X: gag intact, uvula midline CN XI: sternocleidomastoid and trapezius muscles intact CN XII: tongue midline Bulk & Tone: normal, no fasciculations. Motor: 5/5 throughout with no pronator drift. Sensation: intact to light touch, cold, pin, vibration and joint position sense.  No extinction to double simultaneous stimulation.  Romberg test negative Deep Tendon Reflexes: +2 throughout, no ankle clonus Plantar responses: downgoing bilaterally Cerebellar: no incoordination on finger to nose, heel to shin. No dysdiadochokinesia Gait: narrow-based and steady, able  to tandem walk adequately. Tremor: none  IMPRESSION: This is a 81 year old right-handed woman with a history of hypertension presenting with recurrent syncope. No clear epilepsy risk factors. Neurological exam is normal. The etiology of symptoms is unclear, she is undergoing cardiac evaluation. Seizures are less likely, head CT without contrast (unable to do MRI due to ILR) and routine EEG will be ordered to assess for focal abnormalities that increase risk for recurrent seizures. No clear indication for starting seizure medication at this time.   [place] driving laws were discussed with the patient, and she knows to stop driving after an episode of loss of consciousness, until 6 months event-free. She will follow-up in 2 months.  Thank you for allowing me to participate in the care of this patient. Please do not hesitate to call for any questions or concerns.   Patrcia DollyKaren Sharonica Kraszewski, M.D.  CC: Dr. Amador CunasKwiatkowski

## 2014-06-27 NOTE — Patient Instructions (Signed)
1. Schedule CT head without contrast 2. Schedule routine EEG 3. Follow-up in 2 months 4. As per [place] driving laws, after any episode of loss of consciousness, one should not drive until 6 months event-free

## 2014-06-28 ENCOUNTER — Ambulatory Visit (INDEPENDENT_AMBULATORY_CARE_PROVIDER_SITE_OTHER): Payer: Medicare HMO | Admitting: Neurology

## 2014-06-28 DIAGNOSIS — R413 Other amnesia: Secondary | ICD-10-CM

## 2014-06-28 DIAGNOSIS — R404 Transient alteration of awareness: Secondary | ICD-10-CM

## 2014-06-28 DIAGNOSIS — R55 Syncope and collapse: Secondary | ICD-10-CM

## 2014-06-29 ENCOUNTER — Ambulatory Visit (INDEPENDENT_AMBULATORY_CARE_PROVIDER_SITE_OTHER): Payer: Medicare HMO | Admitting: *Deleted

## 2014-06-29 ENCOUNTER — Ambulatory Visit: Payer: Medicare HMO

## 2014-06-29 DIAGNOSIS — R55 Syncope and collapse: Secondary | ICD-10-CM

## 2014-06-29 LAB — MDC_IDC_ENUM_SESS_TYPE_REMOTE

## 2014-06-29 NOTE — Procedures (Signed)
ELECTROENCEPHALOGRAM REPORT  Date of Study: 06/29/2014  Patient's Name: Evelyn Irwin MRN: [id number] Date of Birth: 05/11/1934  Referring Provider: Dr. Patrcia DollyKaren Aquino  Clinical History: This is a 81 year old woman with recurrent episodes of loss of consciousness  Medications: Norvasc, Buspar, Lopressor, Micardis  Technical Summary: A multichannel digital EEG recording measured by the international 10-20 system with electrodes applied with paste and impedances below 5000 ohms performed in our laboratory with EKG monitoring in an awake and asleep patient.  Hyperventilation was not performed. Photic stimulation was performed.  The digital EEG was referentially recorded, reformatted, and digitally filtered in a variety of bipolar and referential montages for optimal display.    Description: The patient is awake and asleep during the recording.  During maximal wakefulness, there is a symmetric, medium voltage 8.5-9 Hz posterior dominant rhythm that attenuates with eye opening.  There is occasional focal 4-5 Hz theta slowing seen over the left temporal region, at times sharply contoured without clear epileptogenic potential. During drowsiness and sleep, there is an increase in theta slowing of the background.  Vertex waves and symmetric sleep spindles were seen.  Photic stimulation did not elicit any abnormalities.  There were no epileptiform discharges or electrographic seizures seen.    EKG lead was unremarkable.  Impression: This awake and asleep EEG is abnormal due to occasional focal slowing over the left temporal region.  Clinical Correlation of the above findings indicates focal cerebral dysfunction over the left temporal region suggestive of underlying structural or physiologic abnormality. The absence of epileptiform discharges does not exclude a clinical diagnosis of epilepsy.  If further clinical questions remain, prolonged EEG may be helpful.  Clinical correlation is advised.   Patrcia DollyKaren  Aquino, M.D.

## 2014-06-29 NOTE — Progress Notes (Signed)
Loop recorder 

## 2014-06-30 ENCOUNTER — Encounter: Payer: Self-pay | Admitting: Cardiology

## 2014-07-03 ENCOUNTER — Other Ambulatory Visit: Payer: Self-pay | Admitting: Neurology

## 2014-07-03 ENCOUNTER — Encounter: Payer: Self-pay | Admitting: Internal Medicine

## 2014-07-03 ENCOUNTER — Encounter: Payer: Self-pay | Admitting: Neurology

## 2014-07-03 ENCOUNTER — Ambulatory Visit (INDEPENDENT_AMBULATORY_CARE_PROVIDER_SITE_OTHER): Payer: Medicare HMO | Admitting: Internal Medicine

## 2014-07-03 ENCOUNTER — Ambulatory Visit
Admission: RE | Admit: 2014-07-03 | Discharge: 2014-07-03 | Disposition: A | Discharge status: Home / Self Care | Payer: Medicare HMO | Source: Ambulatory Visit | Attending: Neurology | Admitting: Neurology

## 2014-07-03 ENCOUNTER — Ambulatory Visit: Admission: RE | Admit: 2014-07-03 | Payer: Medicare HMO | Source: Ambulatory Visit

## 2014-07-03 VITALS — BP 142/78 | HR 63 | Ht 62.0 in | Wt 187.0 lb

## 2014-07-03 DIAGNOSIS — R404 Transient alteration of awareness: Secondary | ICD-10-CM

## 2014-07-03 DIAGNOSIS — R413 Other amnesia: Secondary | ICD-10-CM

## 2014-07-03 DIAGNOSIS — R55 Syncope and collapse: Secondary | ICD-10-CM

## 2014-07-03 DIAGNOSIS — I10 Essential (primary) hypertension: Secondary | ICD-10-CM

## 2014-07-03 LAB — MDC_IDC_ENUM_SESS_TYPE_INCLINIC
Date Time Interrogation Session: 20160201114225
MDC IDC SET ZONE DETECTION INTERVAL: 2000 ms
MDC IDC SET ZONE DETECTION INTERVAL: 390 ms
Zone Setting Detection Interval: 3000 ms

## 2014-07-03 NOTE — Patient Instructions (Signed)
Your physician wants you to follow-up in: 12 months with Dr Allred You will receive a reminder letter in the mail two months in advance. If you don't receive a letter, please call our office to schedule the follow-up appointment.  

## 2014-07-03 NOTE — Progress Notes (Signed)
Electrophysiology Office Note   Date:  07/03/2014   ID:  Evelyn Irwin, DOB Jan 17, 1934, MRN [id number]  PCP:  Rogelia Boga, MD  Cardiologist:  Anne Fu Primary Electrophysiologist: Hillis Range, MD    Chief Complaint  Patient presents with  . Follow-up    Syncope and collapse     History of Present Illness: Evelyn Irwin is a 81 y.o. female who presents today for electrophysiology evaluation.   She has done well since her LINQ was implanted.  She has not had syncope.  Today, she denies symptoms of palpitations, chest pain, shortness of breath, orthopnea, PND, lower extremity edema, claudication, dizziness, presyncope, bleeding, or neurologic sequela. The patient is tolerating medications without difficulties and is otherwise without complaint today.    Past Medical History  Diagnosis Date  . ALLERGIC RHINITIS 11/11/2006  . ANXIETY 11/11/2006  . CONSTIPATION 02/23/2008  . GERD 10/06/2007  . HYPERTENSION 11/11/2006  . HYPOKALEMIA, HX OF 02/09/2008  . OSTEOARTHRITIS 11/11/2006   Past Surgical History  Procedure Laterality Date  . Loop recorder implant N/A 03/30/2014    MDT LINQ implanted by Dr Johney Frame for syncope  . Total knee arthroplasty      Right     Current Outpatient Prescriptions  Medication Sig Dispense Refill  . amLODipine (NORVASC) 10 MG tablet TAKE 1 TABLET BY MOUTH EVERY DAY 90 tablet 1  . busPIRone (BUSPAR) 15 MG tablet TAKE 1/2 TABLET BY MOUTH EVERY DAY 90 tablet 1  . metoprolol tartrate (LOPRESSOR) 25 MG tablet TAKE 1 TABLET BY MOUTH TWICE A DAY 180 tablet 1  . Misc Natural Products (ADRENAL PO) Take 1 tablet by mouth daily as needed (SINUSES).     . Multiple Vitamin (MULTIVITAMIN) tablet Take 1 tablet by mouth daily.    Marland Kitchen telmisartan (MICARDIS) 80 MG tablet TAKE 1 TABLET BY MOUTH EVERY DAY 90 tablet 1  . thiamine (VITAMIN B-1) 100 MG tablet Take 100 mg by mouth daily.     No current facility-administered medications for this visit.    Allergies:    Review of patient's allergies indicates no known allergies.   Social History:  The patient  reports that she has never smoked. She has never used smokeless tobacco. She reports that she does not drink alcohol or use illicit drugs.   Family History:  The patient's family history includes Stroke in her mother.    ROS:  Please see the history of present illness.   All other systems are reviewed and negative.    PHYSICAL EXAM: VS:  BP 142/78 mmHg  Pulse 63  Ht  (1.575 m)  Wt 187 lb (84.823 kg)  BMI 34.19 kg/m2 , BMI Body mass index is 34.19 kg/(m^2). GEN: Well nourished, well developed, in no acute distress HEENT: normal Neck: no JVD, carotid bruits, or masses Cardiac: RRR; no murmurs, rubs, or gallops,no edema  Respiratory:  clear to auscultation bilaterally, normal work of breathing GI: soft, nontender, nondistended, + BS MS: no deformity or atrophy Skin: warm and dry, ILR site is well healed Neuro:  Strength and sensation are intact Psych: euthymic mood, full affect  Device interrogation is reviewed today in detail.  See PaceArt for details.   Recent Labs: 07/30/2013: Hemoglobin 14.3 08/01/2013: BUN 17; Creatinine 1.0; Potassium 3.7; Sodium 139    Lipid Panel     Component Value Date/Time   CHOL 182 05/07/2011 0941   TRIG 70.0 05/07/2011 0941   HDL 67.00 05/07/2011 0941   CHOLHDL 3 05/07/2011 0941  VLDL 14.0 05/07/2011 0941   LDLCALC 101* 05/07/2011 0941     Wt Readings from Last 3 Encounters:  07/03/14 187 lb (84.823 kg)  06/27/14 186 lb (84.369 kg)  06/16/14 188 lb (85.276 kg)      ASSESSMENT AND PLAN:  1.  Syncope Ongoing workup with neurology noted. No syncope since ILR implant No arrhythmias determinedj  2. HTN Stable No change required today  Follow-up with Dr Anne FuSkains as scheduled I will follow remotely through carelink  Return to see me only as needed    Current medicines are reviewed at length with the patient today.   The patient does  not have concerns regarding her medicines.  The following changes were made today:  none  Signed, Hillis RangeJames Cinzia Devos, MD  07/03/2014 12:03 PM     [facility] [address] [place] [address] [phone] (office) [phone] (fax)

## 2014-07-06 ENCOUNTER — Telehealth: Payer: Self-pay | Admitting: Neurology

## 2014-07-06 DIAGNOSIS — R413 Other amnesia: Secondary | ICD-10-CM

## 2014-07-06 DIAGNOSIS — R404 Transient alteration of awareness: Secondary | ICD-10-CM

## 2014-07-07 ENCOUNTER — Encounter: Payer: Self-pay | Admitting: Internal Medicine

## 2014-07-07 NOTE — Telephone Encounter (Signed)
Discussed CT head results and EEG with patient. No epileptiform discharges, unremarkable head CT. Would proceed with 24-hour EEG to further classify her symptoms.  Tiffany, pls schedule, thanks!

## 2014-07-07 NOTE — Addendum Note (Signed)
Addended by: Franciso BendMCNEIL, Reynold Mantell M on: 07/07/2014 12:39 PM   Modules accepted: Orders

## 2014-07-07 NOTE — Telephone Encounter (Signed)
Order for 24 hour EEG put in Epic. Gave to NeshanicDana to schedule.

## 2014-07-10 ENCOUNTER — Telehealth: Payer: Self-pay | Admitting: Internal Medicine

## 2014-07-10 ENCOUNTER — Other Ambulatory Visit: Payer: Self-pay | Admitting: Internal Medicine

## 2014-07-10 NOTE — Telephone Encounter (Signed)
See message and advise

## 2014-07-10 NOTE — Telephone Encounter (Signed)
Spoke to Evelyn Irwin, told her this is an important diagnostic test and would encourage you to have performed per Dr. Kirtland BouchardK. Evelyn Irwin verbalized understanding and will have done.

## 2014-07-10 NOTE — Telephone Encounter (Signed)
Yes, please notify patient.  This is an important diagnostic test and would encourage her to have performed

## 2014-07-10 NOTE — Telephone Encounter (Signed)
Dr aquino's office called Evelyn Irwin wanting to do a EEG (order in the system).  Evelyn Irwin would like to know if this is something have needs to have done.  Evelyn Irwin is willing, but wants to know what you think?

## 2014-07-11 ENCOUNTER — Ambulatory Visit (INDEPENDENT_AMBULATORY_CARE_PROVIDER_SITE_OTHER): Payer: Medicare HMO | Admitting: Neurology

## 2014-07-11 DIAGNOSIS — R413 Other amnesia: Secondary | ICD-10-CM

## 2014-07-11 DIAGNOSIS — R404 Transient alteration of awareness: Secondary | ICD-10-CM

## 2014-07-11 DIAGNOSIS — R55 Syncope and collapse: Secondary | ICD-10-CM

## 2014-07-12 ENCOUNTER — Telehealth: Payer: Self-pay | Admitting: Neurology

## 2014-07-12 NOTE — Telephone Encounter (Signed)
Pt resch appt from 08-29-14 to 08-14-14

## 2014-07-14 ENCOUNTER — Ambulatory Visit (INDEPENDENT_AMBULATORY_CARE_PROVIDER_SITE_OTHER): Payer: Medicare HMO | Admitting: Internal Medicine

## 2014-07-14 ENCOUNTER — Encounter: Payer: Self-pay | Admitting: Internal Medicine

## 2014-07-14 VITALS — BP 140/80 | HR 71 | Temp 98.1°F | Resp 20 | Ht 62.0 in | Wt 186.0 lb

## 2014-07-14 DIAGNOSIS — R55 Syncope and collapse: Secondary | ICD-10-CM

## 2014-07-14 DIAGNOSIS — Z8639 Personal history of other endocrine, nutritional and metabolic disease: Secondary | ICD-10-CM

## 2014-07-14 DIAGNOSIS — I10 Essential (primary) hypertension: Secondary | ICD-10-CM

## 2014-07-14 DIAGNOSIS — Z23 Encounter for immunization: Secondary | ICD-10-CM

## 2014-07-14 NOTE — Patient Instructions (Signed)
Limit your sodium (Salt) intake    It is important that you exercise regularly, at least 20 minutes 3 to 4 times per week.  If you develop chest pain or shortness of breath seek  medical attention.  Neurology follow-up as scheduled  Return in 4 months for follow-up

## 2014-07-14 NOTE — Progress Notes (Signed)
Subjective:    Patient ID: Evelyn Irwin, female    DOB: 07-10-1933, 81 y.o.   MRN: [id number]  HPI 81 year old patient who is seen today in follow-up.  She has a history of hypertension.  She has a history of syncope and has been evaluated by cardiology and neurology.  She is scheduled for neurology follow-up next month.  She is status post ILR and has had no syncopal episodes since implant.  Today feels quite well.  She has had a recent sleep deprived EEG-results pending.  She has had a prior EEG that revealed some nonspecific slowing.  Past Medical History  Diagnosis Date  . ALLERGIC RHINITIS 11/11/2006  . ANXIETY 11/11/2006  . CONSTIPATION 02/23/2008  . GERD 10/06/2007  . HYPERTENSION 11/11/2006  . HYPOKALEMIA, HX OF 02/09/2008  . OSTEOARTHRITIS 11/11/2006    History   Social History  . Marital Status: Married    Spouse Name: N/A  . Number of Children: N/A  . Years of Education: N/A   Occupational History  . Not on file.   Social History Main Topics  . Smoking status: Never Smoker   . Smokeless tobacco: Never Used  . Alcohol Use: No  . Drug Use: No  . Sexual Activity: Not on file   Other Topics Concern  . Not on file   Social History Narrative    Past Surgical History  Procedure Laterality Date  . Loop recorder implant N/A 03/30/2014    MDT LINQ implanted by Dr Johney FrameAllred for syncope  . Total knee arthroplasty      Right    Family History  Problem Relation Age of Onset  . Stroke Mother     No Known Allergies  Current Outpatient Prescriptions on File Prior to Visit  Medication Sig Dispense Refill  . amLODipine (NORVASC) 10 MG tablet TAKE 1 TABLET BY MOUTH EVERY DAY 90 tablet 1  . busPIRone (BUSPAR) 15 MG tablet TAKE 1/2 TABLET BY MOUTH EVERY DAY 90 tablet 1  . metoprolol tartrate (LOPRESSOR) 25 MG tablet TAKE 1 TABLET BY MOUTH TWICE A DAY 180 tablet 1  . Misc Natural Products (ADRENAL PO) Take 1 tablet by mouth daily as needed (SINUSES).     . Multiple Vitamin  (MULTIVITAMIN) tablet Take 1 tablet by mouth daily.    Marland Kitchen. telmisartan (MICARDIS) 80 MG tablet TAKE 1 TABLET BY MOUTH EVERY DAY 90 tablet 1  . thiamine (VITAMIN B-1) 100 MG tablet Take 100 mg by mouth daily.     No current facility-administered medications on file prior to visit.    BP 140/80 mmHg  Pulse 71  Temp(Src) 98.1 F (36.7 C) (Oral)  Resp 20  Ht 5\' 2"  (1.575 m)  Wt 186 lb (84.369 kg)  BMI 34.01 kg/m2  SpO2 98%      Review of Systems  Constitutional: Negative.   HENT: Negative for congestion, dental problem, hearing loss, rhinorrhea, sinus pressure, sore throat and tinnitus.   Eyes: Negative for pain, discharge and visual disturbance.  Respiratory: Negative for cough and shortness of breath.   Cardiovascular: Negative for chest pain, palpitations and leg swelling.  Gastrointestinal: Negative for nausea, vomiting, abdominal pain, diarrhea, constipation, blood in stool and abdominal distention.  Genitourinary: Negative for dysuria, urgency, frequency, hematuria, flank pain, vaginal bleeding, vaginal discharge, difficulty urinating, vaginal pain and pelvic pain.  Musculoskeletal: Negative for joint swelling, arthralgias and gait problem.  Skin: Negative for rash.  Neurological: Negative for dizziness, syncope, speech difficulty, weakness, numbness and headaches.  Hematological:  Negative for adenopathy.  Psychiatric/Behavioral: Negative for behavioral problems, dysphoric mood and agitation. The patient is not nervous/anxious.        Objective:   Physical Exam  Constitutional: She is oriented to person, place, and time. She appears well-developed and well-nourished.  Blood pressure 140/80  HENT:  Head: Normocephalic.  Right Ear: External ear normal.  Left Ear: External ear normal.  Mouth/Throat: Oropharynx is clear and moist.  Eyes: Conjunctivae and EOM are normal. Pupils are equal, round, and reactive to light.  Neck: Normal range of motion. Neck supple. No  thyromegaly present.  Cardiovascular: Normal rate, regular rhythm, normal heart sounds and intact distal pulses.   Pulmonary/Chest: Effort normal and breath sounds normal.  Abdominal: Soft. Bowel sounds are normal. She exhibits no mass. There is no tenderness.  Musculoskeletal: Normal range of motion.  Lymphadenopathy:    She has no cervical adenopathy.  Neurological: She is alert and oriented to person, place, and time.  Skin: Skin is warm and dry. No rash noted.  Psychiatric: She has a normal mood and affect. Her behavior is normal.          Assessment & Plan:   Hypertension.  Recent control.  No change in therapy History of syncope.  Follow-up neurology next month.  Return here 6 months or as needed

## 2014-07-14 NOTE — Progress Notes (Signed)
Pre visit review using our clinic review tool, if applicable. No additional management support is needed unless otherwise documented below in the visit note. 

## 2014-07-21 ENCOUNTER — Ambulatory Visit (INDEPENDENT_AMBULATORY_CARE_PROVIDER_SITE_OTHER): Payer: Medicare HMO | Admitting: Cardiology

## 2014-07-21 ENCOUNTER — Telehealth: Payer: Self-pay | Admitting: Family Medicine

## 2014-07-21 ENCOUNTER — Encounter: Payer: Self-pay | Admitting: Cardiology

## 2014-07-21 VITALS — BP 128/68 | HR 63 | Ht 62.0 in | Wt 186.0 lb

## 2014-07-21 DIAGNOSIS — I10 Essential (primary) hypertension: Secondary | ICD-10-CM

## 2014-07-21 DIAGNOSIS — R55 Syncope and collapse: Secondary | ICD-10-CM

## 2014-07-21 NOTE — Patient Instructions (Signed)
The current medical regimen is effective;  continue present plan and medications.  Follow up in 6 months with Dr. Skains.  You will receive a letter in the mail 2 months before you are due.  Please call us when you receive this letter to schedule your follow up appointment.  Thank you for choosing [facility]!!     

## 2014-07-21 NOTE — Telephone Encounter (Signed)
Patient notified of results.

## 2014-07-21 NOTE — Progress Notes (Signed)
[address] [place], [place]  [address] Phone: [phone] Fax:  [phone]  Date:  07/21/2014   ID:  Evelyn Irwin, DOB December 23, 1933, MRN [id number]  PCP:  Rogelia Boga, MD   History of Present Illness: Evelyn Irwin is a 81 y.o. female here for follow-up of syncope. Linq monitor placed. No further syncopal events thankfully.   Event monitor placed September 2015 which demonstrated no adverse arrhythmias, no tachycardia or bradycardia arrhythmias that would result in syncope. Occasional dizziness/lightheadedness was noted however this was during episodes of sinus rhythm.   Echocardiogram from 02/07/14 demonstrated normal ejection fraction, moderate tricuspid regurgitation with minimally elevated pulmonary pressures. Overall reassuring. No evidence of recurrent dynamic obstruction.  Episode of syncope as follows: She was walking and fainted without warning. She does not remember any palpitations, any prodrome. Back in 2013 she was admitted for syncope to the hospital. Similar story. She was walking in her kitchen and fell out. Telemetry showed no arrhythmias. Head CT and carotid arteries were normal. Echocardiogram showed normal EF.   2 other syncopal episodes. She did have an episode on a Wednesday when eating breakfast, getting her coffee where she felt brief dizziness as she states on the right side of her head, transient, fleeting. No near syncope or syncope.  She has not described any gait instability, bleeding, stroke like symptoms, rashes, fevers, chills, chest pain, shortness of breath.  She has had neurology assessment as well. Reports that EEG has been unremarkable.   Wt Readings from Last 3 Encounters:  07/21/14 186 lb (84.369 kg)  07/14/14 186 lb (84.369 kg)  07/03/14 187 lb (84.823 kg)     Past Medical History  Diagnosis Date  . ALLERGIC RHINITIS 11/11/2006  . ANXIETY 11/11/2006  . CONSTIPATION 02/23/2008  . GERD 10/06/2007  .  HYPERTENSION 11/11/2006  . HYPOKALEMIA, HX OF 02/09/2008  . OSTEOARTHRITIS 11/11/2006    Past Surgical History  Procedure Laterality Date  . Loop recorder implant N/A 03/30/2014    MDT LINQ implanted by Dr Johney Frame for syncope  . Total knee arthroplasty      Right    Current Outpatient Prescriptions  Medication Sig Dispense Refill  . amLODipine (NORVASC) 10 MG tablet TAKE 1 TABLET BY MOUTH EVERY DAY 90 tablet 1  . busPIRone (BUSPAR) 15 MG tablet TAKE 1/2 TABLET BY MOUTH EVERY DAY 90 tablet 1  . metoprolol tartrate (LOPRESSOR) 25 MG tablet TAKE 1 TABLET BY MOUTH TWICE A DAY 180 tablet 1  . Misc Natural Products (ADRENAL PO) Take 1 tablet by mouth daily as needed (SINUSES).     . Multiple Vitamin (MULTIVITAMIN) tablet Take 1 tablet by mouth daily.    Marland Kitchen telmisartan (MICARDIS) 80 MG tablet TAKE 1 TABLET BY MOUTH EVERY DAY 90 tablet 1  . thiamine (VITAMIN B-1) 100 MG tablet Take 100 mg by mouth daily.     No current facility-administered medications for this visit.    Allergies:   No Known Allergies  Social History:  The patient  reports that she has never smoked. She has never used smokeless tobacco. She reports that she does not drink alcohol or use illicit drugs.   No early family history of coronary artery disease.  ROS:  Please see the history of present illness.    All other systems reviewed and negative.   PHYSICAL EXAM: VS:  BP 128/68 mmHg  Pulse 63  Ht  (1.575 m)  Wt 186 lb (84.369 kg)  BMI 34.01 kg/m2 Well nourished, well developed, in no acute distress HEENT: normal, [place]/AT, EOMI Neck: no JVD, normal carotid upstroke, no bruit Cardiac:  normal S1, S2; RRR; no murmur Lungs:  clear to auscultation bilaterally, no wheezing, rhonchi or rales Abd: soft, nontender, no hepatomegaly, no bruits Ext: no edema, 2+ distal pulses Skin: warm and dry GU: deferred Neuro: no focal abnormalities noted, AAO x 3  EKG:   08/01/13-sinus rhythm, 64, normal intervals, normal QT interval,  normal PR interval. No other abnormalities.   ASSESSMENT AND PLAN:  1. Syncope-concerning history for cardiac arrhythmia. Event monitor was reassuring with no evidence of significant bradycardia or tachycardia. Perhaps sinus arrest. Linq monitor implantation.  No further syncope. Hopefully, he will not have another episode of syncope, she has had 4 previous, but if she does, thankfully we will have the monitor in place to detect whether or not this is a pause-related phenomenon and if it is, pacemaker implantation will be helpful. Explained to her that there are other reasons for fainting episodes and many of them are non-life-threatening and do not require any further treatment. EEG was originally reassuring by neurology. 2. Hypertension-blood pressure well controlled. Medications reviewed. 3. I will followup with her in 6 months.  Signed, Donato SchultzMark Alberta Lenhard, MD [facility]  07/21/2014 9:44 AM

## 2014-07-21 NOTE — Telephone Encounter (Signed)
-----   Message from Van ClinesKaren M Aquino, MD sent at 07/21/2014  3:53 PM EST ----- Regarding: EEG results Pls let her know the EEG was normal, thanks

## 2014-07-25 ENCOUNTER — Encounter: Payer: Self-pay | Admitting: Internal Medicine

## 2014-07-28 ENCOUNTER — Ambulatory Visit (INDEPENDENT_AMBULATORY_CARE_PROVIDER_SITE_OTHER): Payer: Medicare HMO | Admitting: *Deleted

## 2014-07-28 DIAGNOSIS — R55 Syncope and collapse: Secondary | ICD-10-CM

## 2014-08-03 NOTE — Progress Notes (Signed)
Loop recorder 

## 2014-08-09 NOTE — Procedures (Signed)
ELECTROENCEPHALOGRAM REPORT  Dates of Recording: 07/11/2014 to 07/12/2014  Patient's Name: Evelyn Irwin MRN: [id number] Date of Birth: December 31, 1933  Referring Provider: Dr. Patrcia DollyKaren Taniah Reinecke  Procedure: 24-hour ambulatory EEG  History: This is a 81 year old woman with recurrent episodes of loss of consciousness  Medications: Norvasc, Buspar, Lopressor, Micardis  Technical Summary: This is a 24-hour multichannel digital EEG recording measured by the international 10-20 system with electrodes applied with paste and impedances below 5000 ohms performed as portable with EKG monitoring.  The digital EEG was referentially recorded, reformatted, and digitally filtered in a variety of bipolar and referential montages for optimal display.    DESCRIPTION OF RECORDING: During maximal wakefulness, the background activity consisted of a symmetric 9 Hz posterior dominant rhythm which was reactive to eye opening.  There is slight asymmetry noted in the background with occasional higher amplitude sharply contoured 8 Hz activity seen over the left temporal region. There were no epileptiform discharges or focal slowing seen in wakefulness.  During the recording, the patient progresses through wakefulness, drowsiness, and Stage 2 sleep. Again, there were no epileptiform discharges seen.  Events: Typical events were not captured. Patient did not complete diary.  There were no electrographic seizures seen.  EKG lead was unremarkable.  IMPRESSION: This 24-hour ambulatory EEG study is within normal limits.   CLINICAL CORRELATION: A normal EEG does not exclude a clinical diagnosis of epilepsy. Slight background asymmetry with occasional higher amplitude sharply contoured activity over the left temporal region is of unclear clinical significance. If further clinical questions remain, inpatient video EEG monitoring may be helpful.   Patrcia DollyKaren Dilara Navarrete, M.D.

## 2014-08-14 ENCOUNTER — Ambulatory Visit (INDEPENDENT_AMBULATORY_CARE_PROVIDER_SITE_OTHER): Payer: Medicare HMO | Admitting: Neurology

## 2014-08-14 ENCOUNTER — Encounter: Payer: Self-pay | Admitting: Neurology

## 2014-08-14 VITALS — BP 120/70 | HR 64 | Ht 62.0 in | Wt 186.9 lb

## 2014-08-14 DIAGNOSIS — R55 Syncope and collapse: Secondary | ICD-10-CM

## 2014-08-14 NOTE — Patient Instructions (Signed)
1. Continue follow-up with your PCP and Cardiologist 2. Follow-up on an as needed basis

## 2014-08-16 ENCOUNTER — Encounter: Payer: Self-pay | Admitting: Neurology

## 2014-08-16 NOTE — Progress Notes (Signed)
NEUROLOGY FOLLOW UP OFFICE NOTE  [name] [id number]  HISTORY OF PRESENT ILLNESS: I had the pleasure of seeing Evelyn Irwin in follow-up in the neurology clinic on 08/14/2014.  The patient was last seen 2 months ago for recurrent syncopal episodes. She is again accompanied by her husband today.  Records and images were personally reviewed where available.  I personally reviewed head CT which did not show any acute changes. Her routine EEG had shown non-specific slowing in the left temporal region, a 24-hour EEG did not show similar findings, and was within normal limits. She denies any further episodes of loss of consciousness since August 2015. She currently has an ILR. She reports occasional dizziness on standing. She denies any headaches, focal numbness/tingling/weakness, vision changes.   HPI: This is a 81 yo RH woman with a history of hypertension presenting with recurrent syncopal episodes. The first episode occurred in 2013 when she woke up on the kitchen floor confused. She was admitted to [facility] where workup was unrevealing. Echo had shown basal septal hypertrophy and dynamic obstruction. She reports had another episode last August 2015 where she woke up on the grass, again feeling confused after. No tongue bite or urinary incontinence. She has chronic tinnitus and reports that the ringing in her ears may have gotten a little louder. She denied feeling dizzy or diaphoretic prior to the episodes. She has had an ILR since October with no recurrence of symptoms.   She denies any gaps in time, staring/unresponsive episodes, olfactory/gustatory hallucinations, deja vu, rising epigastric sensation, focal numbness/tingling/weakness, myoclonic jerks. She and her husband recall a curious episode in 2010 or 2011 when she suddenly felt sleepy and fell across the bed. When she got up she did not know where and who she was, did not recognize their kitchen. She went back to sleep and woke up fine. No  similar episodes since 2010.  She reports one time she felt the She feels that since the syncopal episode in August, she does not remember things like she used to. They were on a cruise last November, and she forgot they were onboard a ship. She would move things around in their house and they cannot find them. She has only missed 1 bill payment, denies missing medications. No family history of similar symptoms.   PAST MEDICAL HISTORY: Past Medical History  Diagnosis Date  . ALLERGIC RHINITIS 11/11/2006  . ANXIETY 11/11/2006  . CONSTIPATION 02/23/2008  . GERD 10/06/2007  . HYPERTENSION 11/11/2006  . HYPOKALEMIA, HX OF 02/09/2008  . OSTEOARTHRITIS 11/11/2006    MEDICATIONS: Current Outpatient Prescriptions on File Prior to Visit  Medication Sig Dispense Refill  . amLODipine (NORVASC) 10 MG tablet TAKE 1 TABLET BY MOUTH EVERY DAY 90 tablet 1  . busPIRone (BUSPAR) 15 MG tablet TAKE 1/2 TABLET BY MOUTH EVERY DAY 90 tablet 1  . metoprolol tartrate (LOPRESSOR) 25 MG tablet TAKE 1 TABLET BY MOUTH TWICE A DAY 180 tablet 1  . Misc Natural Products (ADRENAL PO) Take 1 tablet by mouth daily as needed (SINUSES).     . Multiple Vitamin (MULTIVITAMIN) tablet Take 1 tablet by mouth daily.    Marland Kitchen telmisartan (MICARDIS) 80 MG tablet TAKE 1 TABLET BY MOUTH EVERY DAY 90 tablet 1  . thiamine (VITAMIN B-1) 100 MG tablet Take 100 mg by mouth daily.     No current facility-administered medications on file prior to visit.    ALLERGIES: No Known Allergies  FAMILY HISTORY: Family History  Problem Relation Age of  Onset  . Stroke Mother     SOCIAL HISTORY: History   Social History  . Marital Status: Married    Spouse Name: N/A  . Number of Children: N/A  . Years of Education: N/A   Occupational History  . Not on file.   Social History Main Topics  . Smoking status: Never Smoker   . Smokeless tobacco: Never Used  . Alcohol Use: No  . Drug Use: No  . Sexual Activity: Not on file   Other Topics  Concern  . Not on file   Social History Narrative    REVIEW OF SYSTEMS: Constitutional: No fevers, chills, or sweats, no generalized fatigue, change in appetite Eyes: No visual changes, double vision, eye pain Ear, nose and throat: No hearing loss, ear pain, nasal congestion, sore throat Cardiovascular: No chest pain, palpitations Respiratory:  No shortness of breath at rest or with exertion, wheezes GastrointestinaI: No nausea, vomiting, diarrhea, abdominal pain, fecal incontinence Genitourinary:  No dysuria, urinary retention or frequency Musculoskeletal:  No neck pain, back pain Integumentary: No rash, pruritus, skin lesions Neurological: as above Psychiatric: No depression, insomnia, anxiety Endocrine: No palpitations, fatigue, diaphoresis, mood swings, change in appetite, change in weight, increased thirst Hematologic/Lymphatic:  No anemia, purpura, petechiae. Allergic/Immunologic: no itchy/runny eyes, nasal congestion, recent allergic reactions, rashes  PHYSICAL EXAM: Filed Vitals:   08/14/14 1537  BP: 120/70  Pulse: 64   General: No acute distress Head:  Normocephalic/atraumatic Neck: supple, no paraspinal tenderness, full range of motion Heart:  Regular rate and rhythm Lungs:  Clear to auscultation bilaterally Back: No paraspinal tenderness Skin/Extremities: No rash, no edema Neurological Exam: alert and oriented to person, place, and time. No aphasia or dysarthria. Fund of knowledge is appropriate.  Recent and remote memory are intact.  Attention and concentration are normal.    Able to name objects and repeat phrases. Cranial nerves: Pupils equal, round, reactive to light.  Fundoscopic exam unremarkable, no papilledema. Extraocular movements intact with no nystagmus. Visual fields full. Facial sensation intact. No facial asymmetry. Tongue, uvula, palate midline.  Motor: Bulk and tone normal, muscle strength 5/5 throughout with no pronator drift.  Sensation to light touch  intact.  No extinction to double simultaneous stimulation.  Deep tendon reflexes 2+ throughout, toes downgoing.  Finger to nose testing intact.  Gait narrow-based and steady, able to tandem walk adequately.  Romberg negative.  IMPRESSION: This is a 81 yo RH woman with a history of hypertension presenting with recurrent syncope. No clear epilepsy risk factors, neurological exam is normal. Her head CT and 24-hour EEG are unremarkable. No clear evidence of seizures as the cause of loss of consciousness, she continues with the ILR to monitor for any arrhythmia in the event of another syncopal episode. She is aware of [place] driving laws to stop driving after an episode of loss of consciousness, until 6 months event-free. She will follow-up on a prn basis, and knows to call our office for any change in symptoms.   Thank you for allowing me to participate in her care.  Please do not hesitate to call for any questions or concerns.  The duration of this appointment visit was 15 minutes of face-to-face time with the patient.  Greater than 50% of this time was spent in counseling, explanation of diagnosis, planning of further management, and coordination of care.   Patrcia DollyKaren Krishawna Stiefel, M.D.   CC: Dr. Amador CunasKwiatkowski

## 2014-08-18 LAB — MDC_IDC_ENUM_SESS_TYPE_REMOTE

## 2014-08-28 ENCOUNTER — Other Ambulatory Visit: Payer: Self-pay | Admitting: Internal Medicine

## 2014-08-28 ENCOUNTER — Ambulatory Visit (INDEPENDENT_AMBULATORY_CARE_PROVIDER_SITE_OTHER): Payer: Medicare HMO | Admitting: *Deleted

## 2014-08-28 DIAGNOSIS — R55 Syncope and collapse: Secondary | ICD-10-CM | POA: Diagnosis not present

## 2014-08-28 LAB — MDC_IDC_ENUM_SESS_TYPE_REMOTE

## 2014-08-29 ENCOUNTER — Ambulatory Visit: Payer: Medicare HMO | Admitting: Neurology

## 2014-08-29 NOTE — Progress Notes (Signed)
Loop recorder 

## 2014-08-30 ENCOUNTER — Encounter: Payer: Self-pay | Admitting: Internal Medicine

## 2014-09-25 ENCOUNTER — Telehealth: Payer: Self-pay | Admitting: Internal Medicine

## 2014-09-25 NOTE — Telephone Encounter (Signed)
Spoke w/pt in regards to syncopal episode. Pt was laying across bed watching basketball game on Friday evening. Pt feels that she passed out not sure for how long. Once came to, pt did not feel right in head. Pt knew there was a man in the house with her ( her husband) but pt searched for man in house. Pt sat on side of bed for a few minutes and eventually starting coming around. No episodes recorded on LINQ. Pt feels better but still having ringing in ears. Pt to be followed thru [facility] monitor.

## 2014-09-25 NOTE — Telephone Encounter (Signed)
New message     Patient calling stating she passed- out on Friday night. No emergency room visit. Wanted to know did her reading show anything.

## 2014-09-25 NOTE — Telephone Encounter (Signed)
Patient [facility]  She isn't happy with the cardiologist office Dr. Kirtland BouchardK referred her to.  FYI: There is phone note with today's in the system to cardiology already.

## 2014-09-27 ENCOUNTER — Ambulatory Visit (INDEPENDENT_AMBULATORY_CARE_PROVIDER_SITE_OTHER): Payer: Medicare HMO | Admitting: *Deleted

## 2014-09-27 DIAGNOSIS — R55 Syncope and collapse: Secondary | ICD-10-CM | POA: Diagnosis not present

## 2014-09-28 NOTE — Telephone Encounter (Signed)
Dr. K, please see message and advise. 

## 2014-09-28 NOTE — Progress Notes (Signed)
Loop recorder 

## 2014-10-01 NOTE — Telephone Encounter (Signed)
Notify patient that I don't think a [facility] referral at  [facility] would be very helpful; please schedule follow-up neurology appointment.  Since patient had another apparent syncopal episode without arrhythmia noted on ILR

## 2014-10-03 NOTE — Telephone Encounter (Signed)
Spoke to pt, told her Dr. Kirtland BouchardK said that he does not  think a [facility] would be very helpful. Need to schedule follow-up appointment with your Neurologist Dr. Clydie BraunKaren Aquinosince you had another apparent syncopal episode without arrhythmia noted on ILR. Pt verbalized understanding.

## 2014-10-06 ENCOUNTER — Encounter: Payer: Self-pay | Admitting: Internal Medicine

## 2014-10-06 ENCOUNTER — Ambulatory Visit: Payer: Medicare HMO | Admitting: Internal Medicine

## 2014-10-06 ENCOUNTER — Encounter: Payer: Self-pay | Admitting: Neurology

## 2014-10-06 ENCOUNTER — Ambulatory Visit (INDEPENDENT_AMBULATORY_CARE_PROVIDER_SITE_OTHER): Payer: Medicare HMO | Admitting: Neurology

## 2014-10-06 ENCOUNTER — Telehealth: Payer: Self-pay | Admitting: Family Medicine

## 2014-10-06 VITALS — BP 130/70 | HR 61 | Resp 16 | Ht 62.0 in | Wt 185.0 lb

## 2014-10-06 DIAGNOSIS — R404 Transient alteration of awareness: Secondary | ICD-10-CM | POA: Diagnosis not present

## 2014-10-06 DIAGNOSIS — R55 Syncope and collapse: Secondary | ICD-10-CM | POA: Diagnosis not present

## 2014-10-06 NOTE — Telephone Encounter (Signed)
Called patient to give appt information for [facility]. appt for 11/01/14 @ 8:00 am with Dr. Theodis AguasMhoon    [address] [place], [place] [address]  Ph: [phone]  Fax: [phone]  Will fax records

## 2014-10-06 NOTE — Progress Notes (Signed)
NEUROLOGY FOLLOW UP OFFICE NOTE  Evelyn Irwin [id number]  HISTORY OF PRESENT ILLNESS: I had the pleasure of seeing Evelyn Irwin in follow-up in the neurology clinic on 10/06/2014.  The patient was last seen 2 months ago for recurrent syncopal episodes. She is again accompanied by her husband who helps supplement the history today.  Her head CT was unremarkable. Routine EEG showed occasional left temporal slowing, however her 24-hour EEG did not show similar changes and was within normal limits. She was unable to get an MRI due to loop recorder. She has seen cardiology with normal EF. On her last visit, she had not had any syncopal episodes since August 2015, until 09/25/14 while she was lying on bed watching a ballgame. She has chronic tinnitus but recalls the tinnitus increased, then when she came to, she was lying on her back and felt very confused. She got up and went to the bathroom and felt she did not recognize anything. She walked through all the bedrooms looking for "that man" (her husband), then her memory started coming back. She saw "that man sitting on the chair," but did not speak to him and went back to the room. When he came to find her 20 minutes later, her symptoms had cleared but she did not feel "quite right." No tongue bite or incontinence. She denied any associated headache but thinks she felt this episode coming on a few days prior because she was having increased ringing in her ears for 2 days in a row (no associated confusion). She denied any focal numbness/tingling/weakness but felt she needed to walk slowly, no dizziness. Her husband denies any episodes of unresponsiveness, she denies any olfactory/gustatory hallucinations, rising epigastric sensation, myoclonic jerks.   HPI: This is a 81 yo RH woman with a history of hypertension presenting with recurrent syncopal episodes. The first episode occurred in 2013 when she woke up on the kitchen floor confused. [facility] where workup was unrevealing. Echo had shown basal septal hypertrophy and dynamic obstruction. She reports had another episode last August 2015 where she woke up on the grass, again feeling confused after. No tongue bite or urinary incontinence. She has chronic tinnitus and reports that the ringing in her ears may have gotten a little louder. She denied feeling dizzy or diaphoretic prior to the episodes. She has had an ILR since October 2015.  She denies any gaps in time, staring/unresponsive episodes, olfactory/gustatory hallucinations, deja vu, rising epigastric sensation, focal numbness/tingling/weakness, myoclonic jerks. She and her husband recall a curious episode in 2010 or 2011 when she suddenly felt sleepy and fell across the bed. When she got up she did not know where and who she was, did not recognize their kitchen. She went back to sleep and woke up fine. No similar episodes since 2010.  She feels that since the syncopal episode in August, she does not remember things like she used to. They were on a cruise last November, and she forgot they were onboard a ship. She would move things around in their house and they cannot find them. She has only missed 1 bill payment, denies missing medications. No family history of similar symptoms.   Diagnostic Data: Head CT which did not show any acute changes. Her routine EEG had shown non-specific slowing in the left temporal region, a 24-hour EEG did not show similar findings, and was within normal limits.   PAST MEDICAL HISTORY: Past Medical History  Diagnosis Date  . ALLERGIC RHINITIS  11/11/2006  . ANXIETY 11/11/2006  . CONSTIPATION 02/23/2008  . GERD 10/06/2007  . HYPERTENSION 11/11/2006  . HYPOKALEMIA, HX OF 02/09/2008  . OSTEOARTHRITIS 11/11/2006    MEDICATIONS: Current Outpatient Prescriptions on File Prior to Visit  Medication Sig Dispense Refill  . amLODipine (NORVASC) 10 MG tablet TAKE 1 TABLET BY MOUTH EVERY DAY 90 tablet 1  .  busPIRone (BUSPAR) 15 MG tablet TAKE 1/2 TABLET BY MOUTH EVERY DAY 90 tablet 1  . metoprolol tartrate (LOPRESSOR) 25 MG tablet TAKE 1 TABLET BY MOUTH TWICE A DAY 180 tablet 1  . Misc Natural Products (ADRENAL PO) Take 1 tablet by mouth daily as needed (SINUSES).     . Multiple Vitamin (MULTIVITAMIN) tablet Take 1 tablet by mouth daily.    Marland Kitchen. telmisartan (MICARDIS) 80 MG tablet TAKE 1 TABLET BY MOUTH EVERY DAY 90 tablet 1  . thiamine (VITAMIN B-1) 100 MG tablet Take 100 mg by mouth daily.     No current facility-administered medications on file prior to visit.    ALLERGIES: No Known Allergies  FAMILY HISTORY: Family History  Problem Relation Age of Onset  . Stroke Mother     SOCIAL HISTORY: History   Social History  . Marital Status: Married    Spouse Name: N/A  . Number of Children: N/A  . Years of Education: N/A   Occupational History  . Not on file.   Social History Main Topics  . Smoking status: Never Smoker   . Smokeless tobacco: Never Used  . Alcohol Use: No  . Drug Use: No  . Sexual Activity: Not on file   Other Topics Concern  . Not on file   Social History Narrative    REVIEW OF SYSTEMS: Constitutional: No fevers, chills, or sweats, no generalized fatigue, change in appetite Eyes: No visual changes, double vision, eye pain Ear, nose and throat: No hearing loss, ear pain, nasal congestion, sore throat Cardiovascular: No chest pain, palpitations Respiratory:  No shortness of breath at rest or with exertion, wheezes GastrointestinaI: No nausea, vomiting, diarrhea, abdominal pain, fecal incontinence Genitourinary:  No dysuria, urinary retention or frequency Musculoskeletal:  No neck pain, back pain Integumentary: No rash, pruritus, skin lesions Neurological: as above Psychiatric: No depression, insomnia, anxiety Endocrine: No palpitations, fatigue, diaphoresis, mood swings, change in appetite, change in weight, increased thirst Hematologic/Lymphatic:  No  anemia, purpura, petechiae. Allergic/Immunologic: no itchy/runny eyes, nasal congestion, recent allergic reactions, rashes  PHYSICAL EXAM: Filed Vitals:   10/06/14 0908  BP: 130/70  Pulse: 61  Resp: 16   General: No acute distress Head:  Normocephalic/atraumatic Neck: supple, no paraspinal tenderness, full range of motion Heart:  Regular rate and rhythm Lungs:  Clear to auscultation bilaterally Back: No paraspinal tenderness Skin/Extremities: No rash, no edema Neurological Exam: alert and oriented to person, place, and time. No aphasia or dysarthria. Fund of knowledge is appropriate.  Recent and remote memory are intact.  Attention and concentration are normal.    Able to name objects and repeat phrases. Cranial nerves: Pupils equal, round, reactive to light.  Fundoscopic exam unremarkable, no papilledema. Extraocular movements intact with no nystagmus. Visual fields full. Facial sensation intact. No facial asymmetry. Tongue, uvula, palate midline.  Motor: Bulk and tone normal, muscle strength 5/5 throughout with no pronator drift.  Sensation to light touch intact.  No extinction to double simultaneous stimulation.  Deep tendon reflexes 2+ throughout, toes downgoing.  Finger to nose testing intact.  Gait narrow-based and steady, able to tandem walk adequately.  Romberg negative.  IMPRESSION: This is a 81 yo RH woman with a history of hypertension presenting with recurrent syncope. No clear epilepsy risk factors, neurological exam is normal. Her head CT was unremarkable. Routine EEG had shown occasional left temporal slowing, however her 24-hour EEG did not show similar changes, typical events were not captured. She had another unwitnessed episode of loss of consciousness while lying down last 09/25/14 followed by prolonged confusion. Her loop recorder did not show any abnormalities during this time frame. We discussed that seizures remain a possibility, options of starting anti-epileptic  medication empirically was discussed, versus video EEG monitoring, however we discussed that symptoms are few and far in between and that yield may be low with vEEG. They are very hesitant to start medication. They would like a second opinion at a tertiary academic center where vEEG is available, and will be referred to [facility]. She is aware of [place] driving laws to stop driving after an episode of loss of consciousness, until 6 months event-free. She will follow-up in 3 months and knows to call our office for any questions.   Thank you for allowing me to participate in her care.  Please do not hesitate to call for any questions or concerns.  The duration of this appointment visit was 15 minutes of face-to-face time with the patient.  Greater than 50% of this time was spent in counseling, explanation of diagnosis, planning of further management, and coordination of care.   Patrcia Dolly, M.D.   CC: Dr. Amador Cunas

## 2014-10-06 NOTE — Patient Instructions (Signed)
1. Refer to [facility] for second opinion 2. As per [place] driving laws, after an episode of loss of awareness, one should not drive until 6 months event-free 3. Follow-up in 3 months

## 2014-10-13 ENCOUNTER — Encounter: Payer: Self-pay | Admitting: Neurology

## 2014-10-27 ENCOUNTER — Ambulatory Visit (INDEPENDENT_AMBULATORY_CARE_PROVIDER_SITE_OTHER): Payer: Medicare HMO | Admitting: *Deleted

## 2014-10-27 DIAGNOSIS — R55 Syncope and collapse: Secondary | ICD-10-CM

## 2014-10-27 NOTE — Progress Notes (Signed)
Loop recorder 

## 2014-10-31 LAB — CUP PACEART REMOTE DEVICE CHECK: Date Time Interrogation Session: 20160531135745

## 2014-11-07 LAB — CUP PACEART REMOTE DEVICE CHECK: Date Time Interrogation Session: 20160607121641

## 2014-11-13 ENCOUNTER — Ambulatory Visit (INDEPENDENT_AMBULATORY_CARE_PROVIDER_SITE_OTHER): Payer: Medicare HMO | Admitting: Internal Medicine

## 2014-11-13 ENCOUNTER — Encounter: Payer: Self-pay | Admitting: Internal Medicine

## 2014-11-13 VITALS — BP 140/70 | HR 65 | Temp 98.4°F | Resp 20 | Ht 62.0 in | Wt 191.0 lb

## 2014-11-13 DIAGNOSIS — R404 Transient alteration of awareness: Secondary | ICD-10-CM | POA: Diagnosis not present

## 2014-11-13 DIAGNOSIS — I10 Essential (primary) hypertension: Secondary | ICD-10-CM | POA: Diagnosis not present

## 2014-11-13 DIAGNOSIS — R55 Syncope and collapse: Secondary | ICD-10-CM | POA: Diagnosis not present

## 2014-11-13 NOTE — Progress Notes (Signed)
Subjective:    Patient ID: Evelyn Irwin, female    DOB: January 15, 1934, 81 y.o.   MRN: [id number]  HPI  BP Readings from Last 3 Encounters:  11/13/14 150/82  10/06/14 130/70  08/14/14 69/34    81 year old patient who has treated hypertension.  She has been followed closely by neurology due to syncope with transient alteration of consciousness.  She has had 2 or 3 episodes this year.  There is concern about a possible seizure disorder.  Patient has been reluctant to consider AED treatment.  She has been referred to [facility] epilepsy center  Past Medical History  Diagnosis Date  . ALLERGIC RHINITIS 11/11/2006  . ANXIETY 11/11/2006  . CONSTIPATION 02/23/2008  . GERD 10/06/2007  . HYPERTENSION 11/11/2006  . HYPOKALEMIA, HX OF 02/09/2008  . OSTEOARTHRITIS 11/11/2006    History   Social History  . Marital Status: Married    Spouse Name: N/A  . Number of Children: N/A  . Years of Education: N/A   Occupational History  . Not on file.   Social History Main Topics  . Smoking status: Never Smoker   . Smokeless tobacco: Never Used  . Alcohol Use: No  . Drug Use: No  . Sexual Activity: Not on file   Other Topics Concern  . Not on file   Social History Narrative    Past Surgical History  Procedure Laterality Date  . Loop recorder implant N/A 03/30/2014    MDT LINQ implanted by Dr Johney Frame for syncope  . Total knee arthroplasty      Right    Family History  Problem Relation Age of Onset  . Stroke Mother     No Known Allergies  Current Outpatient Prescriptions on File Prior to Visit  Medication Sig Dispense Refill  . amLODipine (NORVASC) 10 MG tablet TAKE 1 TABLET BY MOUTH EVERY DAY 90 tablet 1  . busPIRone (BUSPAR) 15 MG tablet TAKE 1/2 TABLET BY MOUTH EVERY DAY 90 tablet 1  . metoprolol tartrate (LOPRESSOR) 25 MG tablet TAKE 1 TABLET BY MOUTH TWICE A DAY 180 tablet 1  . Misc Natural Products (ADRENAL PO) Take 1 tablet by mouth daily as needed (SINUSES).     . Multiple Vitamin  (MULTIVITAMIN) tablet Take 1 tablet by mouth daily.    Marland Kitchen telmisartan (MICARDIS) 80 MG tablet TAKE 1 TABLET BY MOUTH EVERY DAY 90 tablet 1  . thiamine (VITAMIN B-1) 100 MG tablet Take 100 mg by mouth daily.     No current facility-administered medications on file prior to visit.    BP 150/82 mmHg  Pulse 65  Temp(Src) 98.4 F (36.9 C) (Oral)  Resp 20  Ht 5\' 2"  (1.575 m)  Wt 191 lb (86.637 kg)  BMI 34.93 kg/m2  SpO2 98%    Review of Systems  Constitutional: Negative.   HENT: Negative for congestion, dental problem, hearing loss, rhinorrhea, sinus pressure, sore throat and tinnitus.   Eyes: Negative for pain, discharge and visual disturbance.  Respiratory: Negative for cough and shortness of breath.   Cardiovascular: Negative for chest pain, palpitations and leg swelling.  Gastrointestinal: Negative for nausea, vomiting, abdominal pain, diarrhea, constipation, blood in stool and abdominal distention.  Genitourinary: Negative for dysuria, urgency, frequency, hematuria, flank pain, vaginal bleeding, vaginal discharge, difficulty urinating, vaginal pain and pelvic pain.  Musculoskeletal: Negative for joint swelling, arthralgias and gait problem.  Skin: Negative for rash.  Neurological: Positive for syncope. Negative for dizziness, speech difficulty, weakness, numbness and headaches.  Hematological: Negative for  adenopathy.  Psychiatric/Behavioral: Positive for confusion and decreased concentration. Negative for behavioral problems, dysphoric mood and agitation. The patient is not nervous/anxious.        Objective:   Physical Exam  Constitutional: She is oriented to person, place, and time. She appears well-developed and well-nourished.  Repeat blood pressure 140/70  HENT:  Head: Normocephalic.  Right Ear: External ear normal.  Left Ear: External ear normal.  Mouth/Throat: Oropharynx is clear and moist.  Eyes: Conjunctivae and EOM are normal. Pupils are equal, round, and reactive  to light.  Neck: Normal range of motion. Neck supple. No thyromegaly present.  Cardiovascular: Normal rate, regular rhythm, normal heart sounds and intact distal pulses.   Pulmonary/Chest: Effort normal and breath sounds normal.  Abdominal: Soft. Bowel sounds are normal. She exhibits no mass. There is no tenderness.  Musculoskeletal: Normal range of motion.  Lymphadenopathy:    She has no cervical adenopathy.  Neurological: She is alert and oriented to person, place, and time.  Skin: Skin is warm and dry. No rash noted.  Psychiatric: She has a normal mood and affect. Her behavior is normal.          Assessment & Plan:   Hypertension, controlled Recurrent syncope.  Follow-up neurology  No change in regimen Recheck 6 months

## 2014-11-13 NOTE — Progress Notes (Signed)
Pre visit review using our clinic review tool, if applicable. No additional management support is needed unless otherwise documented below in the visit note. 

## 2014-11-13 NOTE — Patient Instructions (Signed)
Limit your sodium (Salt) intake  Please check your blood pressure on a regular basis.  If it is consistently greater than 150/90, please make an office appointment.  Return in 6 months for follow-up   

## 2014-11-15 ENCOUNTER — Encounter: Payer: Self-pay | Admitting: Internal Medicine

## 2014-11-27 ENCOUNTER — Ambulatory Visit (INDEPENDENT_AMBULATORY_CARE_PROVIDER_SITE_OTHER): Payer: Medicare HMO | Admitting: *Deleted

## 2014-11-27 DIAGNOSIS — R55 Syncope and collapse: Secondary | ICD-10-CM | POA: Diagnosis not present

## 2014-11-28 ENCOUNTER — Encounter: Payer: Self-pay | Admitting: Internal Medicine

## 2014-11-28 LAB — CUP PACEART REMOTE DEVICE CHECK: Date Time Interrogation Session: 20160628174303

## 2014-11-29 NOTE — Progress Notes (Signed)
Loop recorder 

## 2014-12-12 ENCOUNTER — Encounter: Payer: Self-pay | Admitting: Internal Medicine

## 2014-12-26 ENCOUNTER — Ambulatory Visit (INDEPENDENT_AMBULATORY_CARE_PROVIDER_SITE_OTHER): Payer: Medicare HMO

## 2014-12-26 DIAGNOSIS — R55 Syncope and collapse: Secondary | ICD-10-CM | POA: Diagnosis not present

## 2015-01-03 NOTE — Progress Notes (Signed)
Loop recorder 

## 2015-01-12 ENCOUNTER — Ambulatory Visit (INDEPENDENT_AMBULATORY_CARE_PROVIDER_SITE_OTHER): Payer: Medicare HMO | Admitting: Neurology

## 2015-01-12 ENCOUNTER — Encounter: Payer: Self-pay | Admitting: Neurology

## 2015-01-12 VITALS — BP 120/72 | HR 63 | Resp 16 | Ht 62.0 in | Wt 189.0 lb

## 2015-01-12 DIAGNOSIS — G3184 Mild cognitive impairment, so stated: Secondary | ICD-10-CM

## 2015-01-12 DIAGNOSIS — R404 Transient alteration of awareness: Secondary | ICD-10-CM

## 2015-01-12 DIAGNOSIS — R55 Syncope and collapse: Secondary | ICD-10-CM

## 2015-01-12 LAB — CUP PACEART REMOTE DEVICE CHECK: MDC IDC SESS DTM: 20160812143631

## 2015-01-12 NOTE — Progress Notes (Signed)
NEUROLOGY FOLLOW UP OFFICE NOTE  Evelyn Irwin [id number]  HISTORY OF PRESENT ILLNESS: I had the pleasure of seeing Evelyn Irwin in follow-up in the neurology clinic on 01/12/2015.  The patient was last seen 3 months ago and is again accompanied by her husband who helps supplement the history today.  Records and images were personally reviewed where available.  On her last visit, she reported another episode of confusion in April 2015, loop recorder did not show any abnormalities at that time. We had discussed differentials, and they opted to get a second opinion at [facility]. She had seen Dr. Ernst Bowler, who also noted mild cognitive impairment, and that episodes may be confusional episodes rather than epileptic. He recommended workup for dementia, including PET scan and bloodwork, as well as neurocognitive evaluation. PET is not covered by her insurance. Bloodwork, including TSH, B12, ESR, B6, copper, were normal. He noted that if workup is unrevealing and episodes continue, consider empiric treatment with Keppra. Due to infrequent events, video EEG is not feasible at this time.  Since her last visit, they deny any further episodes of loss of consciousness or confusion. Her husband reports her memory has worsened. She forgets what day it is, and asks the same questions repeatedly. He has been helping her with bills the past 6 months, due to a missed bill payment in the past. She misplaces things frequently and is looking for things all the time. She does very little driving. She continues to cook without leaving the stove on. She denies missing medications. No difficulties with ADLs. He denies any staring/unresponsive episodes, she denies any headaches, dizziness, diplopia, focal numbness/tingling/weakness, no falls.   HPI: This is a 81 yo RH woman with a history of hypertension presenting with recurrent syncopal episodes. The first episode occurred in 2013 when she woke up on the kitchen floor confused. She  was admitted to [facility] where workup was unrevealing. Echo had shown basal septal hypertrophy and dynamic obstruction. She reports had another episode last August 2015 where she woke up on the grass, again feeling confused after. No tongue bite or urinary incontinence. She has chronic tinnitus and reports that the ringing in her ears may have gotten a little louder. She denied feeling dizzy or diaphoretic prior to the episodes. She has had an ILR since October 2015. Her head CT was unremarkable. Routine EEG showed occasional left temporal slowing, however her 24-hour EEG did not show similar changes and was within normal limits. She was unable to get an MRI due to loop recorder. She has seen cardiology with normal EF. She had another event last 09/25/14 while she was lying on bed watching a ballgame. She has chronic tinnitus but recalls the tinnitus increased, then when she came to, she was lying on her back and felt very confused. She got up and went to the bathroom and felt she did not recognize anything. She walked through all the bedrooms looking for "that man" (her husband), then her memory started coming back. She saw "that man sitting on the chair," but did not speak to him and went back to the room. When he came to find her 20 minutes later, her symptoms had cleared but she did not feel "quite right." No tongue bite or incontinence. She denied any associated headache but thinks she felt this episode coming on a few days prior because she was having increased ringing in her ears for 2 days in a row (no associated confusion). Loop recorder did not indicate any  cardiac abnormalities.  She and her husband recall a curious episode in 2010 or 2011 when she suddenly felt sleepy and fell across the bed. When she got up she did not know where and who she was, did not recognize their kitchen. She went back to sleep and woke up fine.   She feels that since the syncopal episode in August 2015, she does not remember  things like she used to. They were on a cruise last November, and she forgot they were onboard a ship. She would move things around in their house and they cannot find them. She has only missed 1 bill payment, denies missing medications. No family history of similar symptoms.   Diagnostic Data: Head CT which did not show any acute changes. Her routine EEG had shown non-specific slowing in the left temporal region, a 24-hour EEG did not show similar findings, and was within normal limits.   PAST MEDICAL HISTORY: Past Medical History  Diagnosis Date  . ALLERGIC RHINITIS 11/11/2006  . ANXIETY 11/11/2006  . CONSTIPATION 02/23/2008  . GERD 10/06/2007  . HYPERTENSION 11/11/2006  . HYPOKALEMIA, HX OF 02/09/2008  . OSTEOARTHRITIS 11/11/2006    MEDICATIONS: Current Outpatient Prescriptions on File Prior to Visit  Medication Sig Dispense Refill  . amLODipine (NORVASC) 10 MG tablet TAKE 1 TABLET BY MOUTH EVERY DAY 90 tablet 1  . busPIRone (BUSPAR) 15 MG tablet TAKE 1/2 TABLET BY MOUTH EVERY DAY 90 tablet 1  . metoprolol tartrate (LOPRESSOR) 25 MG tablet TAKE 1 TABLET BY MOUTH TWICE A DAY 180 tablet 1  . Misc Natural Products (ADRENAL PO) Take 1 tablet by mouth daily as needed (SINUSES).     . Multiple Vitamin (MULTIVITAMIN) tablet Take 1 tablet by mouth daily.    Marland Kitchen telmisartan (MICARDIS) 80 MG tablet TAKE 1 TABLET BY MOUTH EVERY DAY 90 tablet 1  . thiamine (VITAMIN B-1) 100 MG tablet Take 100 mg by mouth daily.     No current facility-administered medications on file prior to visit.    ALLERGIES: No Known Allergies  FAMILY HISTORY: Family History  Problem Relation Age of Onset  . Stroke Mother     SOCIAL HISTORY: Social History   Social History  . Marital Status: Married    Spouse Name: N/A  . Number of Children: N/A  . Years of Education: N/A   Occupational History  . Not on file.   Social History Main Topics  . Smoking status: Never Smoker   . Smokeless tobacco: Never Used  .  Alcohol Use: No  . Drug Use: No  . Sexual Activity: Not on file   Other Topics Concern  . Not on file   Social History Narrative    REVIEW OF SYSTEMS: Constitutional: No fevers, chills, or sweats, no generalized fatigue, change in appetite Eyes: No visual changes, double vision, eye pain Ear, nose and throat: No hearing loss, ear pain, nasal congestion, sore throat Cardiovascular: No chest pain, palpitations Respiratory:  No shortness of breath at rest or with exertion, wheezes GastrointestinaI: No nausea, vomiting, diarrhea, abdominal pain, fecal incontinence Genitourinary:  No dysuria, urinary retention or frequency Musculoskeletal:  No neck pain, back pain Integumentary: No rash, pruritus, skin lesions Neurological: as above Psychiatric: No depression, insomnia, anxiety Endocrine: No palpitations, fatigue, diaphoresis, mood swings, change in appetite, change in weight, increased thirst Hematologic/Lymphatic:  No anemia, purpura, petechiae. Allergic/Immunologic: no itchy/runny eyes, nasal congestion, recent allergic reactions, rashes  PHYSICAL EXAM: Filed Vitals:   01/12/15 0912  BP: 120/72  Pulse: 63  Resp: 16   General: No acute distress Head:  Normocephalic/atraumatic Neck: supple, no paraspinal tenderness, full range of motion Heart:  Regular rate and rhythm Lungs:  Clear to auscultation bilaterally Back: No paraspinal tenderness Skin/Extremities: No rash, no edema Neurological Exam: alert and oriented to person, place, and time. No aphasia or dysarthria. Fund of knowledge is appropriate.  Remote memory intact.  Attention and concentration are normal.    Able to name objects and repeat phrases.  Montreal Cognitive Assessment  01/12/2015  Visuospatial/ Executive (0/5) 2  Naming (0/3) 1  Attention: Read list of digits (0/2) 2  Attention: Read list of letters (0/1) 1  Attention: Serial 7 subtraction starting at 100 (0/3) 3  Language: Repeat phrase (0/2) 2  Language :  Fluency (0/1) 1  Abstraction (0/2) 1  Delayed Recall (0/5) 0  Orientation (0/6) 6  Total 19  Adjusted Score (based on education) 19    Cranial nerves: Pupils equal, round, reactive to light.  Fundoscopic exam unremarkable, no papilledema. Extraocular movements intact with no nystagmus. Visual fields full. Facial sensation intact. No facial asymmetry. Tongue, uvula, palate midline.  Motor: Bulk and tone normal, muscle strength 5/5 throughout with no pronator drift.  Sensation to light touch intact.  No extinction to double simultaneous stimulation.  Deep tendon reflexes 1+ throughout, toes downgoing.  Finger to nose testing intact.  Gait narrow-based and steady, slight difficulty with tandem walk but able.  Romberg negative.  IMPRESSION: This is a 81 yo RH woman with a history of hypertension and recurrent syncope of unclear etiology. No clear epilepsy risk factors, neurological exam is normal. Her head CT was unremarkable. Routine EEG had shown occasional left temporal slowing, however her 24-hour EEG did not show similar changes, typical events were not captured. She had another unwitnessed episode of loss of consciousness while lying down last 09/25/14 followed by prolonged confusion. Her loop recorder did not show any abnormalities during this time frame. She has been evaluated at [facility], considerations include seizure versus confusional episodes related to underlying dementia. MOCA score today 19/30, indicating mild cognitive impairment. Recommendation at [facility] was PET scan [employer] will not cover) and neurocognitive assessment, which she has not done yet. She will call Dr. Eusebio Friendly office to schedule this. Per his note, if workup unrevealing, consideration for starting Keppra empirically will be done, which is what we had discussed on her previous visit as well. She may benefit from cholinesterase inhibitors such as Aricept, we have agreed to wait for neurocognitive assessment first before starting any  medication. We discussed the importance of physical exercise and brain stimulation exercises for brain health. She will follow-up after neurocognitive testing and visit with Dr. Ernst Bowler. She knows to call our office for any changes, and is aware of [place] driving laws to stop driving after an episode of loss of consciousness, until 6 months event-free.   Thank you for allowing me to participate in her care.  Please do not hesitate to call for any questions or concerns.  The duration of this appointment visit was 24 minutes of face-to-face time with the patient.  Greater than 50% of this time was spent in counseling, explanation of diagnosis, planning of further management, and coordination of care.   Ellouise Newer, M.D.   CC: Dr. Burnice Logan

## 2015-01-12 NOTE — Patient Instructions (Addendum)
1. Call Dr. Sherrie Mustache office to let them know you have not heard back about Neurocognitive testing he had recommended 2. Follow-up in 5 months (after your visit with Dr. Theodis Aguas), call our office for any changes 3. As per [place] driving laws, no driving after an episode of loss of consciousness/awareness, until 6 months event-free

## 2015-01-19 ENCOUNTER — Encounter: Payer: Self-pay | Admitting: Internal Medicine

## 2015-01-20 ENCOUNTER — Emergency Department (HOSPITAL_COMMUNITY)
Admission: EM | Admit: 2015-01-20 | Discharge: 2015-01-20 | Disposition: A | Discharge status: Home / Self Care | Payer: Medicare HMO | Attending: Emergency Medicine | Admitting: Emergency Medicine

## 2015-01-20 ENCOUNTER — Encounter (HOSPITAL_COMMUNITY): Payer: Self-pay | Admitting: Emergency Medicine

## 2015-01-20 DIAGNOSIS — Z8739 Personal history of other diseases of the musculoskeletal system and connective tissue: Secondary | ICD-10-CM | POA: Insufficient documentation

## 2015-01-20 DIAGNOSIS — F419 Anxiety disorder, unspecified: Secondary | ICD-10-CM | POA: Insufficient documentation

## 2015-01-20 DIAGNOSIS — K219 Gastro-esophageal reflux disease without esophagitis: Secondary | ICD-10-CM | POA: Diagnosis not present

## 2015-01-20 DIAGNOSIS — I10 Essential (primary) hypertension: Secondary | ICD-10-CM | POA: Diagnosis not present

## 2015-01-20 DIAGNOSIS — N898 Other specified noninflammatory disorders of vagina: Secondary | ICD-10-CM | POA: Diagnosis present

## 2015-01-20 DIAGNOSIS — Z79899 Other long term (current) drug therapy: Secondary | ICD-10-CM | POA: Diagnosis not present

## 2015-01-20 DIAGNOSIS — Z8639 Personal history of other endocrine, nutritional and metabolic disease: Secondary | ICD-10-CM | POA: Insufficient documentation

## 2015-01-20 DIAGNOSIS — N8110 Cystocele, unspecified: Secondary | ICD-10-CM

## 2015-01-20 HISTORY — DX: Other amnesia: R41.3

## 2015-01-20 NOTE — ED Provider Notes (Signed)
CSN: [id number]     Arrival date & time 01/20/15  0822 History   First MD Initiated Contact with Patient 01/20/15 [phone]     Chief Complaint  Patient presents with  . bump in vagina      (Consider location/radiation/quality/duration/timing/severity/associated sxs/prior Treatment) HPI Evelyn Irwin is a 81 y.o. female with history of anxiety, memory loss, GERD, osteoarthritis, presents to emergency department complaining of a "bump to the vagina." Patient states she noticed yesterday after she was looking at her private area in the mirror. Patient denies any pain or swelling. She denies any vaginal discharge or bleeding. She states "I just don't know what it is." She denies history of the same. No treatment prior to coming in.  Past Medical History  Diagnosis Date  . ALLERGIC RHINITIS 11/11/2006  . ANXIETY 11/11/2006  . CONSTIPATION 02/23/2008  . GERD 10/06/2007  . HYPERTENSION 11/11/2006  . HYPOKALEMIA, HX OF 02/09/2008  . OSTEOARTHRITIS 11/11/2006  . Memory loss    Past Surgical History  Procedure Laterality Date  . Loop recorder implant N/A 03/30/2014    MDT LINQ implanted by Dr Johney Frame for syncope  . Total knee arthroplasty      Right   Family History  Problem Relation Age of Onset  . Stroke Mother    Social History  Substance Use Topics  . Smoking status: Never Smoker   . Smokeless tobacco: Never Used  . Alcohol Use: No   OB History    No data available     Review of Systems  Constitutional: Negative for fever and chills.  Gastrointestinal: Negative for nausea, vomiting and abdominal pain.  Genitourinary: Positive for genital sores. Negative for dysuria, vaginal bleeding, vaginal discharge, vaginal pain and pelvic pain.  All other systems reviewed and are negative.     Allergies  Review of patient's allergies indicates no known allergies.  Home Medications   Prior to Admission medications   Medication Sig Start Date End Date Taking? Authorizing Provider   amLODipine (NORVASC) 10 MG tablet TAKE 1 TABLET BY MOUTH EVERY DAY 08/28/14   Gordy Savers, MD  busPIRone (BUSPAR) 15 MG tablet TAKE 1/2 TABLET BY MOUTH EVERY DAY 07/10/14   Gordy Savers, MD  metoprolol tartrate (LOPRESSOR) 25 MG tablet TAKE 1 TABLET BY MOUTH TWICE A DAY 08/28/14   Gordy Savers, MD  Misc Natural Products (ADRENAL PO) Take 1 tablet by mouth daily as needed (SINUSES).     Historical Provider, MD  Multiple Vitamin (MULTIVITAMIN) tablet Take 1 tablet by mouth daily.    Historical Provider, MD  telmisartan (MICARDIS) 80 MG tablet TAKE 1 TABLET BY MOUTH EVERY DAY 08/28/14   Gordy Savers, MD  thiamine (VITAMIN B-1) 100 MG tablet Take 100 mg by mouth daily.    Historical Provider, MD   BP 157/68 mmHg  Pulse 65  Temp(Src) 98 F (36.7 C) (Oral)  Resp 18  Ht 5\' 3"  (1.6 m)  Wt 190 lb (86.183 kg)  BMI 33.67 kg/m2  SpO2 97% Physical Exam  Constitutional: She appears well-developed and well-nourished. No distress.  HENT:  Head: Normocephalic.  Eyes: Conjunctivae are normal.  Neck: Neck supple.  Cardiovascular: Normal rate, regular rhythm and normal heart sounds.   Pulmonary/Chest: Effort normal and breath sounds normal. No respiratory distress. She has no wheezes. She has no rales.  Abdominal: Soft. Bowel sounds are normal. She exhibits no distension. There is no tenderness. There is no rebound.  Genitourinary: Vagina normal.  Normal external genitalia.  No rashes, abscesses, lesions noted to the labia majora labia minora. There is a small cystocele noted in the vaginal opening. No tenderness to palpation or vaginal walls. No masses palpated   Musculoskeletal: She exhibits no edema.  Neurological: She is alert.  Skin: Skin is warm and dry.  Psychiatric: She has a normal mood and affect. Her behavior is normal.  Nursing note and vitals reviewed.   ED Course  Procedures (including critical care time) Labs Review Labs Reviewed - No data to  display  Imaging Review No results found. I have personally reviewed and evaluated these images and lab results as part of my medical decision-making.   EKG Interpretation None      MDM   Final diagnoses:  Female bladder prolapse    patient here for a bump that she noted while looking her perineum in the near. There is no pain or tenderness. Her exam is unremarkable except for small cystocele. No other source, swelling, masses seen or palpated. Plan to discharge home with primary care doctor follow-up.  Filed Vitals:   01/20/15 0902  BP: 157/68  Pulse: 65  Temp: 98 F (36.7 C)  TempSrc: Oral  Resp: 18  Height:  (1.6 m)  Weight: 190 lb (86.183 kg)  SpO2: 97%     Jaynie Crumble, PA-C 01/20/15 0932  Jerelyn Scott, MD 01/20/15 [id number]

## 2015-01-20 NOTE — ED Notes (Addendum)
Pt reports she was drying off after taking a shower and felt a bump in her vagina. Pt states she looked at it with a mirror and it was white. Pt denies pain from this bump.  -Pt is a poor historian and states she is "unsure" of a lot of answers to questions and states she has been dizzy and passing out and having memory loss lately and states she thinks her primary care doctor has seen her for this already. Noted in chart patient has seen a neurologist this month for acute loss of awareness.

## 2015-01-20 NOTE — Discharge Instructions (Signed)
Your vaginal area looks normal except for mild bladder prolapse. Follow up with primary care doctor or ob/gyn as needed.

## 2015-01-25 ENCOUNTER — Ambulatory Visit (INDEPENDENT_AMBULATORY_CARE_PROVIDER_SITE_OTHER): Payer: Medicare HMO | Admitting: *Deleted

## 2015-01-25 DIAGNOSIS — R55 Syncope and collapse: Secondary | ICD-10-CM | POA: Diagnosis not present

## 2015-01-25 NOTE — Progress Notes (Signed)
Loop recorder 

## 2015-01-29 LAB — CUP PACEART REMOTE DEVICE CHECK: MDC IDC SESS DTM: 20160829102119

## 2015-02-07 ENCOUNTER — Encounter: Payer: Self-pay | Admitting: Internal Medicine

## 2015-02-08 ENCOUNTER — Ambulatory Visit: Payer: Medicare HMO | Admitting: Cardiology

## 2015-02-09 ENCOUNTER — Ambulatory Visit: Payer: Medicare HMO | Admitting: Cardiology

## 2015-02-16 ENCOUNTER — Ambulatory Visit (INDEPENDENT_AMBULATORY_CARE_PROVIDER_SITE_OTHER): Payer: Medicare HMO | Admitting: Cardiology

## 2015-02-16 ENCOUNTER — Encounter: Payer: Self-pay | Admitting: Cardiology

## 2015-02-16 VITALS — BP 158/84 | HR 64 | Ht 61.0 in | Wt 188.8 lb

## 2015-02-16 DIAGNOSIS — R55 Syncope and collapse: Secondary | ICD-10-CM | POA: Diagnosis not present

## 2015-02-16 DIAGNOSIS — I498 Other specified cardiac arrhythmias: Secondary | ICD-10-CM | POA: Diagnosis not present

## 2015-02-16 DIAGNOSIS — I10 Essential (primary) hypertension: Secondary | ICD-10-CM | POA: Diagnosis not present

## 2015-02-16 NOTE — Patient Instructions (Signed)
Medication Instructions:  Your physician recommends that you continue on your current medications as directed. Please refer to the Current Medication list given to you today.  Labwork: NONE  Testing/Procedures: NONE  Follow-Up: Your physician recommends that you schedule a follow-up appointment with Dr. Johney Frame as soon as possible to have loop recorder removed.  Your physician recommends that you schedule a follow-up appointment as needed with Dr. Anne Fu.   Any Other Special Instructions Will Be Listed Below (If Applicable).

## 2015-02-16 NOTE — Progress Notes (Signed)
[address] [place], [place]  [address] Phone: [phone] Fax:  [phone]  Date:  02/16/2015   ID:  Evelyn Irwin, DOB 09/27/1933, MRN [id number]  PCP:  Rogelia Boga, MD   History of Present Illness: Evelyn Irwin is a 81 y.o. female here for follow-up of syncope. Linq monitor placed. Reassuring. No evidence of adverse arrhythmias despite 2 recurrent episodes of syncope.  Event monitor placed September 2015 which demonstrated no adverse arrhythmias, no tachycardia or bradycardia arrhythmias that would result in syncope. Occasional dizziness/lightheadedness was noted however this was during episodes of sinus rhythm.   Echocardiogram from 02/07/14 demonstrated normal ejection fraction, moderate tricuspid regurgitation with minimally elevated pulmonary pressures. Overall reassuring. No evidence of recurrent dynamic obstruction.  Episode of syncope as follows: She was walking and fainted without warning. She does not remember any palpitations, any prodrome. Back in 2013 she was admitted for syncope to the hospital. Similar story. She was walking in her kitchen and fell out. Telemetry showed no arrhythmias. Head CT and carotid arteries were normal. Echocardiogram showed normal EF.   2 other syncopal episodes. She did have an episode  when eating breakfast, getting her coffee where she felt brief dizziness as she states on the right side of her head, transient, fleeting. No near syncope or syncope.  She has not described any gait instability, bleeding, stroke like symptoms, rashes, fevers, chills, chest pain, shortness of breath.  She has had neurology assessment as well. Reports that EEG has been unremarkable.  She has a Linq, [employer] loop recorder. Thus far her transmissions have been normal rhythm. Dr. Johney Frame implanted. 03/2014.  She reports that she is still having episodes.    Wt Readings from Last 3 Encounters:  02/16/15 188 lb 12.8 oz (85.639 kg)   01/20/15 190 lb (86.183 kg)  01/12/15 189 lb (85.73 kg)     Past Medical History  Diagnosis Date  . ALLERGIC RHINITIS 11/11/2006  . ANXIETY 11/11/2006  . CONSTIPATION 02/23/2008  . GERD 10/06/2007  . HYPERTENSION 11/11/2006  . HYPOKALEMIA, HX OF 02/09/2008  . OSTEOARTHRITIS 11/11/2006  . Memory loss     Past Surgical History  Procedure Laterality Date  . Loop recorder implant N/A 03/30/2014    MDT LINQ implanted by Dr Johney Frame for syncope  . Total knee arthroplasty      Right    Current Outpatient Prescriptions  Medication Sig Dispense Refill  . amLODipine (NORVASC) 10 MG tablet TAKE 1 TABLET BY MOUTH EVERY DAY 90 tablet 1  . busPIRone (BUSPAR) 15 MG tablet TAKE 1/2 TABLET BY MOUTH EVERY DAY 90 tablet 1  . metoprolol tartrate (LOPRESSOR) 25 MG tablet TAKE 1 TABLET BY MOUTH TWICE A DAY 180 tablet 1  . Misc Natural Products (ADRENAL PO) Take 1 tablet by mouth daily as needed (SINUSES).     . Multiple Vitamin (MULTIVITAMIN) tablet Take 1 tablet by mouth daily.    Marland Kitchen telmisartan (MICARDIS) 80 MG tablet TAKE 1 TABLET BY MOUTH EVERY DAY 90 tablet 1   No current facility-administered medications for this visit.    Allergies:   No Known Allergies  Social History:  The patient  reports that she has never smoked. She has never used smokeless tobacco. She reports that she does not drink alcohol or use illicit drugs.   No early family history of coronary artery disease.  ROS:  Please see the history of present illness.    All other systems reviewed and  negative.   PHYSICAL EXAM: VS:  BP 158/84 mmHg  Pulse 64  Ht  (1.549 m)  Wt 188 lb 12.8 oz (85.639 kg)  BMI 35.69 kg/m2 Well nourished, well developed, in no acute distress HEENT: normal, [place]/AT, EOMI Neck: no JVD, normal carotid upstroke, no bruit Cardiac:  normal S1, S2; RRR; no murmur Lungs:  clear to auscultation bilaterally, no wheezing, rhonchi or rales Abd: soft, nontender, no hepatomegaly, no bruits Ext: no edema, 2+  distal pulses Skin: warm and dry GU: deferred Neuro: no focal abnormalities noted, AAO x 3  EKG:   Today-02/16/15-sinus rhythm, 64, no other abnormalities personally viewed-prior 08/01/13-sinus rhythm, 64, normal intervals, normal QT interval, normal PR interval. No other abnormalities.   ASSESSMENT AND PLAN:  1. Syncope-concerning history for cardiac arrhythmia however this has been dispelled by loop recorder showing no evidence of any dangerous arrhythmias, no pauses.  Event monitor was previously reassuring as well with no evidence of significant bradycardia or tachycardia. Explained to her that there are other reasons for fainting episodes and many of them are non-life-threatening and do not require any further treatment. EEG was originally reassuring by neurology. Encouraged her to get further blood work evaluation from primary physician, Dr. Kirtland Bouchard.  Since she has correlation with cardiac arrhythmia, she would like her Linq to be explanted. 2. Hypertension-blood pressure mildly elevated. Medications reviewed. 3. PRN with me. She will get an appointment with Dr. Johney Frame to have device explanted.  Signed, Donato Schultz, MD [facility]  02/16/2015 3:52 PM

## 2015-02-23 ENCOUNTER — Ambulatory Visit (INDEPENDENT_AMBULATORY_CARE_PROVIDER_SITE_OTHER): Payer: Medicare HMO | Admitting: *Deleted

## 2015-02-23 ENCOUNTER — Other Ambulatory Visit: Payer: Self-pay | Admitting: Internal Medicine

## 2015-02-23 DIAGNOSIS — R55 Syncope and collapse: Secondary | ICD-10-CM

## 2015-03-01 NOTE — Progress Notes (Signed)
Loop recorder 

## 2015-03-05 ENCOUNTER — Other Ambulatory Visit: Payer: Self-pay | Admitting: Internal Medicine

## 2015-03-07 LAB — CUP PACEART REMOTE DEVICE CHECK: MDC IDC SESS DTM: 20160924140518

## 2015-03-07 NOTE — Progress Notes (Signed)
Carelink summary report received. Battery status OK. Normal device function. No new symptom episodes, tachy episodes, brady, or pause episodes. No new AF episodes. Monthly summary reports and ROV w/ JA 1/17.

## 2015-03-14 ENCOUNTER — Ambulatory Visit: Payer: Medicare HMO | Admitting: Internal Medicine

## 2015-03-14 DIAGNOSIS — Z23 Encounter for immunization: Secondary | ICD-10-CM | POA: Diagnosis not present

## 2015-03-21 ENCOUNTER — Encounter (HOSPITAL_COMMUNITY)
Admission: AD | Disposition: A | Discharge status: Home / Self Care | Payer: Self-pay | Source: Ambulatory Visit | Attending: Internal Medicine

## 2015-03-21 ENCOUNTER — Encounter: Payer: Self-pay | Admitting: Internal Medicine

## 2015-03-21 ENCOUNTER — Ambulatory Visit (INDEPENDENT_AMBULATORY_CARE_PROVIDER_SITE_OTHER): Payer: Medicare HMO | Admitting: Internal Medicine

## 2015-03-21 ENCOUNTER — Encounter (HOSPITAL_COMMUNITY): Payer: Self-pay | Admitting: *Deleted

## 2015-03-21 ENCOUNTER — Ambulatory Visit (HOSPITAL_COMMUNITY)
Admission: AD | Admit: 2015-03-21 | Discharge: 2015-03-21 | Disposition: A | Discharge status: Home / Self Care | Payer: Medicare HMO | Source: Ambulatory Visit | Attending: Internal Medicine | Admitting: Internal Medicine

## 2015-03-21 VITALS — BP 138/80 | HR 63 | Ht 61.0 in | Wt 190.0 lb

## 2015-03-21 DIAGNOSIS — Z4509 Encounter for adjustment and management of other cardiac device: Secondary | ICD-10-CM | POA: Insufficient documentation

## 2015-03-21 DIAGNOSIS — R55 Syncope and collapse: Secondary | ICD-10-CM | POA: Diagnosis not present

## 2015-03-21 DIAGNOSIS — Z79899 Other long term (current) drug therapy: Secondary | ICD-10-CM | POA: Diagnosis not present

## 2015-03-21 DIAGNOSIS — K219 Gastro-esophageal reflux disease without esophagitis: Secondary | ICD-10-CM | POA: Diagnosis not present

## 2015-03-21 DIAGNOSIS — M1990 Unspecified osteoarthritis, unspecified site: Secondary | ICD-10-CM | POA: Diagnosis not present

## 2015-03-21 DIAGNOSIS — I10 Essential (primary) hypertension: Secondary | ICD-10-CM | POA: Insufficient documentation

## 2015-03-21 HISTORY — PX: EP IMPLANTABLE DEVICE: SHX172B

## 2015-03-21 LAB — CUP PACEART INCLINIC DEVICE CHECK: MDC IDC SESS DTM: 20161019150737

## 2015-03-21 SURGERY — LOOP RECORDER REMOVAL

## 2015-03-21 MED ORDER — LIDOCAINE-EPINEPHRINE 1 %-1:100000 IJ SOLN
INTRAMUSCULAR | Status: AC
  Filled 2015-03-21: qty 1

## 2015-03-21 MED ORDER — LIDOCAINE-EPINEPHRINE 1 %-1:100000 IJ SOLN
INTRAMUSCULAR | Status: DC | PRN
  Administered 2015-03-21: 20 mL via INTRADERMAL

## 2015-03-21 SURGICAL SUPPLY — 1 items: PACK LOOP INSERTION (CUSTOM PROCEDURE TRAY) ×2 IMPLANT

## 2015-03-21 NOTE — Progress Notes (Signed)
Electrophysiology Office Note   Date:  03/21/2015   ID:  Evelyn Irwin, DOB 1934/02/14, MRN [id number]  PCP:  Rogelia BogaKWIATKOWSKI,PETER FRANK, MD  Cardiologist:  Anne FuSkains Primary Electrophysiologist: Hillis RangeJames Drayven Marchena, MD    Chief Complaint  Patient presents with  . Syncope and collapse     History of Present Illness: Evelyn Irwin is a 81 y.o. female who presents today for electrophysiology evaluation.   She has done well since her LINQ was implanted.  She has had syncope during which she did not have an arrhythmia.  She is clear that she wants to have her ILR removed at this time.  Today, she denies symptoms of palpitations, chest pain, shortness of breath, orthopnea, PND, lower extremity edema, claudication, dizziness, presyncope, bleeding, or neurologic sequela. The patient is tolerating medications without difficulties and is otherwise without complaint today.    Past Medical History  Diagnosis Date  . ALLERGIC RHINITIS 11/11/2006  . ANXIETY 11/11/2006  . CONSTIPATION 02/23/2008  . GERD 10/06/2007  . HYPERTENSION 11/11/2006  . HYPOKALEMIA, HX OF 02/09/2008  . OSTEOARTHRITIS 11/11/2006  . Memory loss    Past Surgical History  Procedure Laterality Date  . Loop recorder implant N/A 03/30/2014    MDT LINQ implanted by Dr Johney FrameAllred for syncope  . Total knee arthroplasty      Right     Current Outpatient Prescriptions  Medication Sig Dispense Refill  . amLODipine (NORVASC) 10 MG tablet TAKE 1 TABLET BY MOUTH EVERY DAY 90 tablet 1  . busPIRone (BUSPAR) 15 MG tablet TAKE 1/2 TABLET BY MOUTH EVERY DAY 90 tablet 1  . metoprolol tartrate (LOPRESSOR) 25 MG tablet TAKE 1 TABLET BY MOUTH TWICE A DAY 180 tablet 2  . Misc Natural Products (ADRENAL PO) Take 1 tablet by mouth daily as needed (SINUSES).     . Multiple Vitamin (MULTIVITAMIN) tablet Take 1 tablet by mouth daily.    Marland Kitchen. telmisartan (MICARDIS) 80 MG tablet TAKE 1 TABLET BY MOUTH EVERY DAY 90 tablet 1   No current facility-administered  medications for this visit.    Allergies:   Review of patient's allergies indicates no known allergies.   Social History:  The patient  reports that she has never smoked. She has never used smokeless tobacco. She reports that she does not drink alcohol or use illicit drugs.   Family History:  The patient's family history includes Cancer in her sister; Stroke in her brother, brother, father, and mother.    ROS:  Please see the history of present illness.   All other systems are reviewed and negative.    PHYSICAL EXAM: VS:  BP 138/80 mmHg  Pulse 63  Ht 5\' 1"  (1.549 m)  Wt 190 lb (86.183 kg)  BMI 35.92 kg/m2 , BMI Body mass index is 35.92 kg/(m^2). GEN: Well nourished, well developed, in no acute distress HEENT: normal Neck: no JVD, carotid bruits, or masses Cardiac: RRR; no murmurs, rubs, or gallops,no edema  Respiratory:  clear to auscultation bilaterally, normal work of breathing GI: soft, nontender, nondistended, + BS MS: no deformity or atrophy Skin: warm and dry, ILR site is well healed Neuro:  Strength and sensation are intact Psych: euthymic mood, full affect ILR site is well healed, nontender, no infection  Device interrogation is reviewed today in detail.  See PaceArt for details.   Recent Labs: No results found for requested labs within last 365 days.    Lipid Panel     Component Value Date/Time   CHOL 182 05/07/2011  0941   TRIG 70.0 05/07/2011 0941   HDL 67.00 05/07/2011 0941   CHOLHDL 3 05/07/2011 0941   VLDL 14.0 05/07/2011 0941   LDLCALC 101* 05/07/2011 0941     Wt Readings from Last 3 Encounters:  03/21/15 190 lb (86.183 kg)  02/16/15 188 lb 12.8 oz (85.639 kg)  01/20/15 190 lb (86.183 kg)      ASSESSMENT AND PLAN:  1.  Syncope Not arrhythmogenic ILR interrogation today reveals no arrhythmia She is clear that she would like her ILR removed.  Risks, benefits, and alternatives to ILR removal were discussed with the patient who wishes to proceed  at this time.  2. HTN Stable No change required today  Follow-up with Dr Anne Fu as scheduled I Return to see me only as needed    Current medicines are reviewed at length with the patient today.   The patient does not have concerns regarding her medicines.  The following changes were made today:  none  Signed, Hillis Range, MD  03/21/2015 2:31 PM     [facility] [address] [place] [address] [phone] (office) [phone] (fax)

## 2015-03-21 NOTE — Discharge Instructions (Signed)
Wound Care Taking care of your wound properly can help to prevent pain and infection. It can also help your wound to heal more quickly.   Check your wound every day for signs of infection. Watch for:  Redness, swelling, or pain.  Fluid, blood, or pus.  Keep the dressing dry 10 days till seen in office . Do not take baths, swim, use a hot tub, or do anything that would put your wound underwater until your health care provider approves.  Raise (elevate) the injured area above the level of your heart while you are sitting or lying down.  Do not scratch or pick at the wound.  Keep all follow-up visits as told by your health care provider. This is important. SEEK MEDICAL CARE IF:  You received a tetanus shot and you have swelling, severe pain, redness, or bleeding at the injection site.  You have a fever.  Your pain is not controlled with medicine.  You have increased redness, swelling, or pain at the site of your wound.  You have fluid, blood, or pus coming from your wound.  You notice a bad smell coming from your wound or your dressing. SEEK IMMEDIATE MEDICAL CARE IF:  You have a red streak going away from your wound.   This information is not intended to replace advice given to you by your health care provider. Make sure you discuss any questions you have with your health care provider.   Document Released: 02/26/2008 Document Revised: 10/03/2014 Document Reviewed: 05/15/2014 Elsevier Interactive Patient Education [employer]2016 Elsevier Inc.

## 2015-03-21 NOTE — Patient Instructions (Signed)
Medication Instructions:  Your physician recommends that you continue on your current medications as directed. Please refer to the Current Medication list given to you today.   Labwork:  None ordered   Testing/Procedures:  LINQ removal today 03/21/15  Follow-Up:  Your physician recommends that you schedule a follow-up appointment in: 7-10 days with the device clinic and as needed with Dr Johney FrameAllred   Any Other Special Instructions Will Be Listed Below (If Applicable).

## 2015-03-22 ENCOUNTER — Encounter (HOSPITAL_COMMUNITY): Payer: Self-pay | Admitting: Internal Medicine

## 2015-03-23 ENCOUNTER — Encounter: Payer: Self-pay | Admitting: Internal Medicine

## 2015-04-03 ENCOUNTER — Ambulatory Visit: Payer: Medicare HMO

## 2015-04-04 ENCOUNTER — Ambulatory Visit: Payer: Medicare HMO

## 2015-04-04 ENCOUNTER — Ambulatory Visit (INDEPENDENT_AMBULATORY_CARE_PROVIDER_SITE_OTHER): Payer: Medicare HMO | Admitting: *Deleted

## 2015-04-04 DIAGNOSIS — Z9889 Other specified postprocedural states: Secondary | ICD-10-CM

## 2015-04-04 NOTE — Progress Notes (Signed)
Wound check in clinic s/p loop recorder (MDT LINQ) explant 03/21/15 by Dr. Johney FrameAllred. Wound edges approximated without redness, swelling and drainage. Pt instructed about wound care. Follow up PRN with Dr. Anne FuSkains per his last OV note.

## 2015-05-02 DIAGNOSIS — J029 Acute pharyngitis, unspecified: Secondary | ICD-10-CM | POA: Diagnosis not present

## 2015-05-02 DIAGNOSIS — Z20818 Contact with and (suspected) exposure to other bacterial communicable diseases: Secondary | ICD-10-CM | POA: Diagnosis not present

## 2015-05-02 DIAGNOSIS — I10 Essential (primary) hypertension: Secondary | ICD-10-CM | POA: Diagnosis not present

## 2015-05-15 ENCOUNTER — Ambulatory Visit (INDEPENDENT_AMBULATORY_CARE_PROVIDER_SITE_OTHER): Payer: Medicare HMO | Admitting: Internal Medicine

## 2015-05-15 ENCOUNTER — Encounter: Payer: Self-pay | Admitting: Internal Medicine

## 2015-05-15 VITALS — BP 140/70 | HR 79 | Temp 98.1°F | Ht 62.0 in | Wt 185.0 lb

## 2015-05-15 DIAGNOSIS — I10 Essential (primary) hypertension: Secondary | ICD-10-CM

## 2015-05-15 DIAGNOSIS — R404 Transient alteration of awareness: Secondary | ICD-10-CM | POA: Diagnosis not present

## 2015-05-15 DIAGNOSIS — G3184 Mild cognitive impairment, so stated: Secondary | ICD-10-CM | POA: Diagnosis not present

## 2015-05-15 LAB — COMPREHENSIVE METABOLIC PANEL
ALT: 15 U/L (ref 0–35)
AST: 21 U/L (ref 0–37)
Albumin: 4.4 g/dL (ref 3.5–5.2)
Alkaline Phosphatase: 79 U/L (ref 39–117)
BUN: 19 mg/dL (ref 6–23)
CALCIUM: 9.8 mg/dL (ref 8.4–10.5)
CHLORIDE: 106 meq/L (ref 96–112)
CO2: 29 meq/L (ref 19–32)
CREATININE: 1.23 mg/dL — AB (ref 0.40–1.20)
GFR: 54.08 mL/min — ABNORMAL LOW (ref >60.00)
GLUCOSE: 118 mg/dL — AB (ref 70–99)
Potassium: 3.3 mEq/L — ABNORMAL LOW (ref 3.5–5.1)
SODIUM: 145 meq/L (ref 135–145)
Total Bilirubin: 0.6 mg/dL (ref 0.2–1.2)
Total Protein: 7.7 g/dL (ref 6.0–8.3)

## 2015-05-15 LAB — LIPID PANEL
CHOL/HDL RATIO: 3
Cholesterol: 187 mg/dL (ref 0–200)
HDL: 64.5 mg/dL (ref >39.00)
LDL CALC: 108 mg/dL — AB (ref 0–99)
NONHDL: 122.31
Triglycerides: 73 mg/dL (ref 0.0–149.0)
VLDL: 14.6 mg/dL (ref 0.0–40.0)

## 2015-05-15 LAB — CBC WITH DIFFERENTIAL/PLATELET
BASOS PCT: 0.2 % (ref 0.0–3.0)
Basophils Absolute: 0 10*3/uL (ref 0.0–0.1)
EOS ABS: 0.1 10*3/uL (ref 0.0–0.7)
Eosinophils Relative: 0.6 % (ref 0.0–5.0)
HCT: 40.8 % (ref 36.0–46.0)
Hemoglobin: 13.3 g/dL (ref 12.0–15.0)
LYMPHS PCT: 24.1 % (ref 12.0–46.0)
Lymphs Abs: 2.4 10*3/uL (ref 0.7–4.0)
MCHC: 32.5 g/dL (ref 30.0–36.0)
MCV: 93.7 fl (ref 78.0–100.0)
Monocytes Absolute: 0.5 10*3/uL (ref 0.1–1.0)
Monocytes Relative: 4.7 % (ref 3.0–12.0)
NEUTROS ABS: 7.1 10*3/uL (ref 1.4–7.7)
Neutrophils Relative %: 70.4 % (ref 43.0–77.0)
PLATELETS: 338 10*3/uL (ref 150.0–400.0)
RBC: 4.36 Mil/uL (ref 3.87–5.11)
RDW: 15.6 % — AB (ref 11.5–15.5)
WBC: 10 10*3/uL (ref 4.0–10.5)

## 2015-05-15 LAB — TSH: TSH: 2.58 u[IU]/mL (ref 0.35–4.50)

## 2015-05-15 MED ORDER — POTASSIUM CHLORIDE CRYS ER 20 MEQ PO TBCR: 20.0000 meq | EXTENDED_RELEASE_TABLET | Freq: Every day | ORAL | Status: DC

## 2015-05-15 NOTE — Patient Instructions (Signed)
Limit your sodium (Salt) intake    It is important that you exercise regularly, at least 20 minutes 3 to 4 times per week.  If you develop chest pain or shortness of breath seek  medical attention.  Return in 4 months for follow-up  Please check your blood pressure on a regular basis.  If it is consistently greater than 150/90, please make an office appointment.

## 2015-05-15 NOTE — Progress Notes (Signed)
Pre visit review using our clinic review tool, if applicable. No additional management support is needed unless otherwise documented below in the visit note. 

## 2015-05-15 NOTE — Progress Notes (Signed)
Subjective:    Patient ID: Evelyn Irwin, female    DOB: Aug 18, 1933, 81 y.o.   MRN: [id number]  HPI  81 year old patient who is seen today in follow-up.  She has essential hypertension which has been well-controlled.  She has been evaluated by neurology due to transient alteration of awareness, as well as mild cognitive impairment.  She is also had a recent evaluation at [facility] She has been followed by cardiology and is status post ILR removal which was noncontributory. She has had no further episodes of syncope She has a history allergic rhinitis and her only complaints are, postnasal drip with intermittent hoarseness and cough  Past Medical History  Diagnosis Date  . ALLERGIC RHINITIS 11/11/2006  . ANXIETY 11/11/2006  . CONSTIPATION 02/23/2008  . GERD 10/06/2007  . HYPERTENSION 11/11/2006  . HYPOKALEMIA, HX OF 02/09/2008  . OSTEOARTHRITIS 11/11/2006  . Memory loss     Social History   Social History  . Marital Status: Married    Spouse Name: N/A  . Number of Children: N/A  . Years of Education: N/A   Occupational History  . Not on file.   Social History Main Topics  . Smoking status: Never Smoker   . Smokeless tobacco: Never Used  . Alcohol Use: No  . Drug Use: No  . Sexual Activity: Not on file   Other Topics Concern  . Not on file   Social History Narrative    Past Surgical History  Procedure Laterality Date  . Loop recorder implant N/A 03/30/2014    MDT LINQ implanted by Dr Johney Frame for syncope  . Total knee arthroplasty      Right  . Ep implantable device N/A 03/21/2015    Procedure: Loop Recorder Removal;  Surgeon: Hillis Range, MD;  Location: [facility] INVASIVE [facility];  Service: Cardiovascular;  Laterality: N/A;    Family History  Problem Relation Age of Onset  . Stroke Mother   . Stroke Father   . Cancer Sister   . Stroke Brother   . Stroke Brother     No Known Allergies  Current Outpatient Prescriptions on File Prior to Visit  Medication Sig Dispense  Refill  . amLODipine (NORVASC) 10 MG tablet TAKE 1 TABLET BY MOUTH EVERY DAY 90 tablet 1  . busPIRone (BUSPAR) 15 MG tablet TAKE 1/2 TABLET BY MOUTH EVERY DAY 90 tablet 1  . metoprolol tartrate (LOPRESSOR) 25 MG tablet TAKE 1 TABLET BY MOUTH TWICE A DAY 180 tablet 2  . Misc Natural Products (ADRENAL PO) Take 1 tablet by mouth daily as needed (SINUSES).     . Multiple Vitamin (MULTIVITAMIN) tablet Take 1 tablet by mouth daily.    Marland Kitchen telmisartan (MICARDIS) 80 MG tablet TAKE 1 TABLET BY MOUTH EVERY DAY 90 tablet 1   No current facility-administered medications on file prior to visit.    BP 140/70 mmHg  Pulse 79  Temp(Src) 98.1 F (36.7 C) (Oral)  Ht  (1.575 m)  Wt 185 lb (83.915 kg)  BMI 33.83 kg/m2  SpO2 98%       Review of Systems  Constitutional: Negative.   HENT: Positive for postnasal drip and voice change. Negative for congestion, dental problem, hearing loss, rhinorrhea, sinus pressure, sore throat and tinnitus.   Eyes: Negative for pain, discharge and visual disturbance.  Respiratory: Positive for cough. Negative for shortness of breath.   Cardiovascular: Negative for chest pain, palpitations and leg swelling.  Gastrointestinal: Negative for nausea, vomiting, abdominal pain, diarrhea, constipation, blood  in stool and abdominal distention.  Genitourinary: Negative for dysuria, urgency, frequency, hematuria, flank pain, vaginal bleeding, vaginal discharge, difficulty urinating, vaginal pain and pelvic pain.  Musculoskeletal: Negative for joint swelling, arthralgias and gait problem.  Skin: Negative for rash.  Neurological: Negative for dizziness, syncope, speech difficulty, weakness, numbness and headaches.  Hematological: Negative for adenopathy.  Psychiatric/Behavioral: Negative for behavioral problems, dysphoric mood and agitation. The patient is not nervous/anxious.        Objective:   Physical Exam  Constitutional: She is oriented to person, place, and time. She  appears well-developed and well-nourished.  HENT:  Head: Normocephalic.  Right Ear: External ear normal.  Left Ear: External ear normal.  Mouth/Throat: Oropharynx is clear and moist.  Eyes: Conjunctivae and EOM are normal. Pupils are equal, round, and reactive to light.  Neck: Normal range of motion. Neck supple. No thyromegaly present.  Cardiovascular: Normal rate, regular rhythm, normal heart sounds and intact distal pulses.   Pulmonary/Chest: Effort normal and breath sounds normal.  Abdominal: Soft. Bowel sounds are normal. She exhibits no mass. There is no tenderness.  Musculoskeletal: Normal range of motion.  Lymphadenopathy:    She has no cervical adenopathy.  Neurological: She is alert and oriented to person, place, and time.  Skin: Skin is warm and dry. No rash noted.  Psychiatric: She has a normal mood and affect. Her behavior is normal.          Assessment & Plan:   Essential hypertension Mild cognitive impairment.  Status post recent evaluation [facility].  Follow-up neurology Allergic rhinitis History of hypokalemia.  No recent lab.  Will check update History of syncope, stable  Follow-up 4 months

## 2015-05-18 DIAGNOSIS — Z1231 Encounter for screening mammogram for malignant neoplasm of breast: Secondary | ICD-10-CM | POA: Diagnosis not present

## 2015-05-18 DIAGNOSIS — Z803 Family history of malignant neoplasm of breast: Secondary | ICD-10-CM | POA: Diagnosis not present

## 2015-05-18 LAB — HM MAMMOGRAPHY

## 2015-06-12 ENCOUNTER — Telehealth: Payer: Self-pay | Admitting: Internal Medicine

## 2015-06-12 NOTE — Telephone Encounter (Addendum)
Spoke with patient and let her know that what she is feeling could be scar tissue.  She says that she will keep an eye on it.  She is to follow up as needed with Dr Johney FrameAllred

## 2015-06-12 NOTE — Telephone Encounter (Signed)
New Messages   Pt is calling pertaining to a plate that Dr.Allred removed   she states that she "feels like it is not fully all the way removed"  She wants to speak to the RN about the Procedure  The pt states she "feels a little lump"

## 2015-06-14 ENCOUNTER — Encounter: Payer: Self-pay | Admitting: Internal Medicine

## 2015-06-15 ENCOUNTER — Encounter: Payer: Self-pay | Admitting: Neurology

## 2015-06-15 ENCOUNTER — Ambulatory Visit (INDEPENDENT_AMBULATORY_CARE_PROVIDER_SITE_OTHER): Payer: Medicare HMO | Admitting: Neurology

## 2015-06-15 VITALS — BP 130/78 | HR 60 | Wt 183.0 lb

## 2015-06-15 DIAGNOSIS — R55 Syncope and collapse: Secondary | ICD-10-CM | POA: Diagnosis not present

## 2015-06-15 DIAGNOSIS — F03A0 Unspecified dementia, mild, without behavioral disturbance, psychotic disturbance, mood disturbance, and anxiety: Secondary | ICD-10-CM

## 2015-06-15 DIAGNOSIS — F0390 Unspecified dementia without behavioral disturbance: Secondary | ICD-10-CM

## 2015-06-15 NOTE — Patient Instructions (Signed)
1. Call Dr. Sherrie MustacheMhoon's office about the problems with Aricept 2. Control of BP, cholesterol, as well as physical exercise and brain stimulation exercises are important for brain health 3. Follow-up in 8 months

## 2015-06-15 NOTE — Progress Notes (Signed)
NEUROLOGY FOLLOW UP OFFICE NOTE  Evelyn Irwin [id number]  HISTORY OF PRESENT ILLNESS: I had the pleasure of seeing Evelyn Irwin in follow-up in the neurology clinic on 06/15/2015.  The patient was last seen 5 months ago and is again accompanied by her husband who helps supplement the history today.  Records and images were personally reviewed where available.  She denies any further episodes of confusion/loss of time. She had episodes while on the loop recorder, which did not show any associated arrhythmia. She had requested for monitor to be taken out, but today feels that "there is something left inside," with occasional pain. She has spoken to the Cardiology office and this was felt to be due to scar tissue. Since her last visit, she underwent Neurocognitive testing at [facility]. Report unavailable for review, however per Dr. Sherrie Mustache note: preliminary report dementia. She was started on Aricept last month. She states she stopped taking this because it was giving her too much gas. She has not informed Dr. Theodis Aguas about this. She has noticed memory changes herself, she would be cooking in the kitchen and could not recall what to put next. She drives short distances without getting lost. Her husband denies any personality changes. She denies any headaches, dizziness, no falls.   HPI: This is a 82 yo RH woman with a history of hypertension presenting with recurrent syncopal episodes. The first episode occurred in 2013 when she woke up on the kitchen floor confused. She was admitted to [facility] where workup was unrevealing. Evelyn had shown basal septal hypertrophy and dynamic obstruction. She reports had another episode last August 2015 where she woke up on the grass, again feeling confused after. No tongue bite or urinary incontinence. She has chronic tinnitus and reports that the ringing in her ears may have gotten a little louder. She denied feeling dizzy or diaphoretic prior to the episodes. She has had an ILR  since October 2015. Her head CT was unremarkable. Routine EEG showed occasional left temporal slowing, however her 24-hour EEG did not show similar changes and was within normal limits. She was unable to get an MRI due to loop recorder. She has seen cardiology with normal EF. She had another event last 09/25/14 while she was lying on bed watching a ballgame. She has chronic tinnitus but recalls the tinnitus increased, then when she came to, she was lying on her back and felt very confused. She got up and went to the bathroom and felt she did not recognize anything. She walked through all the bedrooms looking for "that man" (her husband), then her memory started coming back. She saw "that man sitting on the chair," but did not speak to him and went back to the room. When he came to find her 20 minutes later, her symptoms had cleared but she did not feel "quite right." No tongue bite or incontinence. She denied any associated headache but thinks she felt this episode coming on a few days prior because she was having increased ringing in her ears for 2 days in a row (no associated confusion). Loop recorder did not indicate any cardiac abnormalities.  She and her husband recall a curious episode in 2010 or 2011 when she suddenly felt sleepy and fell across the bed. When she got up she did not know where and who she was, did not recognize their kitchen. She went back to sleep and woke up fine.   She feels that since the syncopal episode in August 2015, she does  not remember things like she used to. They were on a cruise last November, and she forgot they were onboard a ship. She would move things around in their house and they cannot find them. She has only missed 1 bill payment, denies missing medications. No family history of similar symptoms.   Diagnostic Data: Head CT which did not show any acute changes. Her routine EEG had shown non-specific slowing in the left temporal region, a 24-hour EEG did not show  similar findings, and was within normal limits.   PAST MEDICAL HISTORY: Past Medical History  Diagnosis Date  . ALLERGIC RHINITIS 11/11/2006  . ANXIETY 11/11/2006  . CONSTIPATION 02/23/2008  . GERD 10/06/2007  . HYPERTENSION 11/11/2006  . HYPOKALEMIA, HX OF 02/09/2008  . OSTEOARTHRITIS 11/11/2006  . Memory loss     MEDICATIONS: Current Outpatient Prescriptions on File Prior to Visit  Medication Sig Dispense Refill  . amLODipine (NORVASC) 10 MG tablet TAKE 1 TABLET BY MOUTH EVERY DAY 90 tablet 1  . busPIRone (BUSPAR) 15 MG tablet TAKE 1/2 TABLET BY MOUTH EVERY DAY 90 tablet 1  . metoprolol tartrate (LOPRESSOR) 25 MG tablet TAKE 1 TABLET BY MOUTH TWICE A DAY 180 tablet 2  . Misc Natural Products (ADRENAL PO) Take 1 tablet by mouth daily as needed (SINUSES).     . Multiple Vitamin (MULTIVITAMIN) tablet Take 1 tablet by mouth daily.    . potassium chloride SA (K-DUR,KLOR-CON) 20 MEQ tablet Take 1 tablet (20 mEq total) by mouth daily. 90 tablet 3  . telmisartan (MICARDIS) 80 MG tablet TAKE 1 TABLET BY MOUTH EVERY DAY 90 tablet 1   No current facility-administered medications on file prior to visit.    ALLERGIES: No Known Allergies  FAMILY HISTORY: Family History  Problem Relation Age of Onset  . Stroke Mother   . Stroke Father   . Cancer Sister   . Stroke Brother   . Stroke Brother     SOCIAL HISTORY: Social History   Social History  . Marital Status: Married    Spouse Name: N/A  . Number of Children: N/A  . Years of Education: N/A   Occupational History  . Not on file.   Social History Main Topics  . Smoking status: Never Smoker   . Smokeless tobacco: Never Used  . Alcohol Use: No  . Drug Use: No  . Sexual Activity: Not on file   Other Topics Concern  . Not on file   Social History Narrative    REVIEW OF SYSTEMS: Constitutional: No fevers, chills, or sweats, no generalized fatigue, change in appetite Eyes: No visual changes, double vision, eye pain Ear, nose  and throat: No hearing loss, ear pain, nasal congestion, sore throat Cardiovascular: No chest pain, palpitations Respiratory:  No shortness of breath at rest or with exertion, wheezes GastrointestinaI: No nausea, vomiting, diarrhea, abdominal pain, fecal incontinence Genitourinary:  No dysuria, urinary retention or frequency Musculoskeletal:  No neck pain, back pain Integumentary: No rash, pruritus, skin lesions Neurological: as above Psychiatric: No depression, insomnia, anxiety Endocrine: No palpitations, fatigue, diaphoresis, mood swings, change in appetite, change in weight, increased thirst Hematologic/Lymphatic:  No anemia, purpura, petechiae. Allergic/Immunologic: no itchy/runny eyes, nasal congestion, recent allergic reactions, rashes  PHYSICAL EXAM: Filed Vitals:   06/15/15 1001  BP: 130/78  Pulse: 60   General: No acute distress Head:  Normocephalic/atraumatic Neck: supple, no paraspinal tenderness, full range of motion Heart:  Regular rate and rhythm Lungs:  Clear to auscultation bilaterally Back: No paraspinal  tenderness Skin/Extremities: No rash, no edema Neurological Exam: alert and oriented to person, place, and time. No aphasia or dysarthria. Fund of knowledge is appropriate.  Remote memory intact.  Attention and concentration are normal.    Able to name objects and repeat phrases. CDT 4/5 MMSE - Mini Mental State Exam 06/15/2015 06/27/2014  Orientation to time 4 4  Orientation to Place 5 5  Registration 3 3  Attention/ Calculation 5 5  Recall 0 0  Language- name 2 objects 2 2  Language- repeat 1 1  Language- follow 3 step command 3 3  Language- read & follow direction 1 1  Write a sentence 1 1  Copy design 1 1  Total score 26 26   Cranial nerves: Pupils equal, round, reactive to light. Extraocular movements intact with no nystagmus. Visual fields full. Facial sensation intact. No facial asymmetry. Tongue, uvula, palate midline.  Motor: Bulk and tone normal,  muscle strength 5/5 throughout with no pronator drift.  Sensation to light touch intact.  No extinction to double simultaneous stimulation.  Deep tendon reflexes 1+ throughout, toes downgoing.  Finger to nose testing intact.  Gait narrow-based and steady, slight difficulty with tandem walk but able (similar to prior).  Romberg negative.  IMPRESSION: This is a 82 yo RH woman with a history of hypertension and recurrent syncope of unclear etiology. No clear epilepsy risk factors, neurological exam is normal. Her head CT was unremarkable. Routine EEG had shown occasional left temporal slowing, however her 24-hour EEG did not show similar changes, typical events were not captured. Cardiac workup unrevealing, with no arrhythmia noted on loop monitor during an event. She denies any similar episodes since April 2016. She has been evaluated at [facility], concern for confusional episodes due to neurodegenerative disorder. Neurocognitive testing at [facility] showing dementia per reports. MMSE today 26/30. She did not tolerate Aricept and will call Dr. Sherrie Mustache office to discuss other options. I would be happy to continue to follow her after her visit at [facility]. She is concerned about a previous carotid ultrasound done by mobile units in 2013 showing plaque buildup and will speak to her PCP about this. We again discussed the importance of physical exercise and brain stimulation exercises for brain health. She knows to call our office for any changes, and is aware of [place] driving laws to stop driving after an episode of loss of consciousness, until 6 months event-free. She will follow-up in 8 months and knows to call for any changes.   Thank you for allowing me to participate in her care.  Please do not hesitate to call for any questions or concerns.  The duration of this appointment visit was 25 minutes of face-to-face time with the patient.  Greater than 50% of this time was spent in counseling, explanation of diagnosis, planning of  further management, and coordination of care.   Patrcia Dolly, M.D.   CC: Dr. Amador Cunas

## 2015-07-11 ENCOUNTER — Other Ambulatory Visit: Payer: Self-pay | Admitting: Internal Medicine

## 2015-07-11 DIAGNOSIS — H5203 Hypermetropia, bilateral: Secondary | ICD-10-CM | POA: Diagnosis not present

## 2015-07-11 DIAGNOSIS — H524 Presbyopia: Secondary | ICD-10-CM | POA: Diagnosis not present

## 2015-07-11 DIAGNOSIS — H2513 Age-related nuclear cataract, bilateral: Secondary | ICD-10-CM | POA: Diagnosis not present

## 2015-07-11 DIAGNOSIS — H25013 Cortical age-related cataract, bilateral: Secondary | ICD-10-CM | POA: Diagnosis not present

## 2015-07-24 IMAGING — CR DG ABDOMEN ACUTE W/ 1V CHEST
3 series · 3 of 3 positions shown · non-contrast
Comparison: Chest radiographs dated 01/23/2012

CLINICAL DATA: Abdominal pain

EXAM:
ACUTE ABDOMEN SERIES (ABDOMEN 2 VIEW & CHEST 1 VIEW)

[w chest pa]
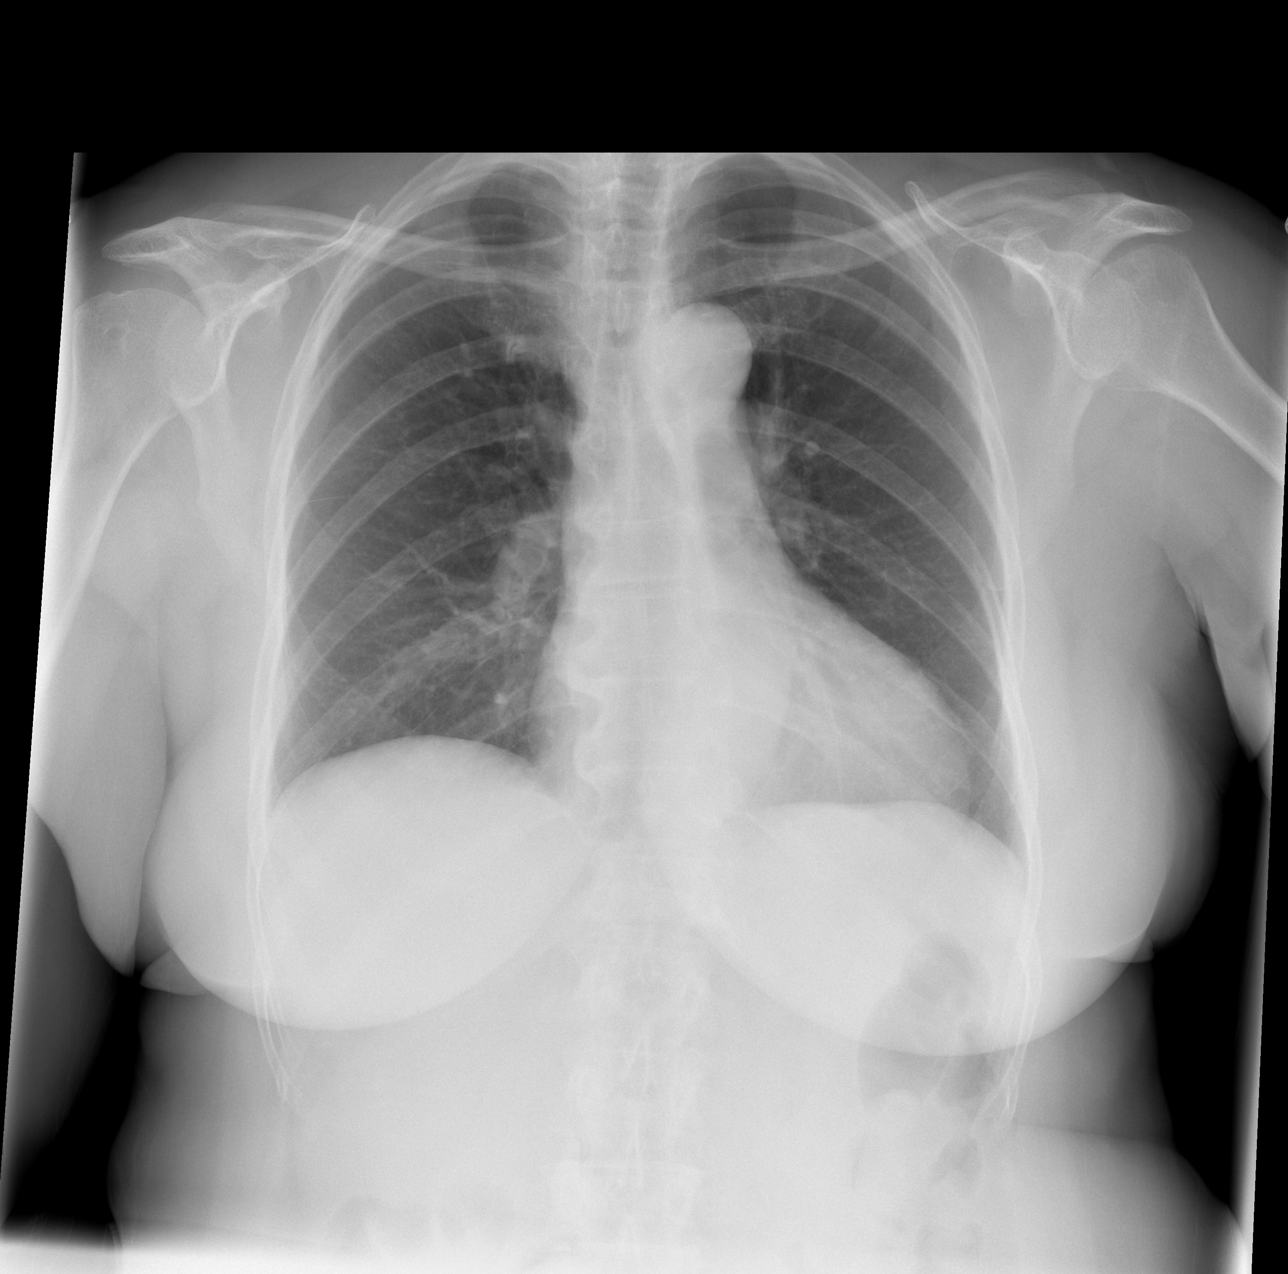

[w abdomen upright]
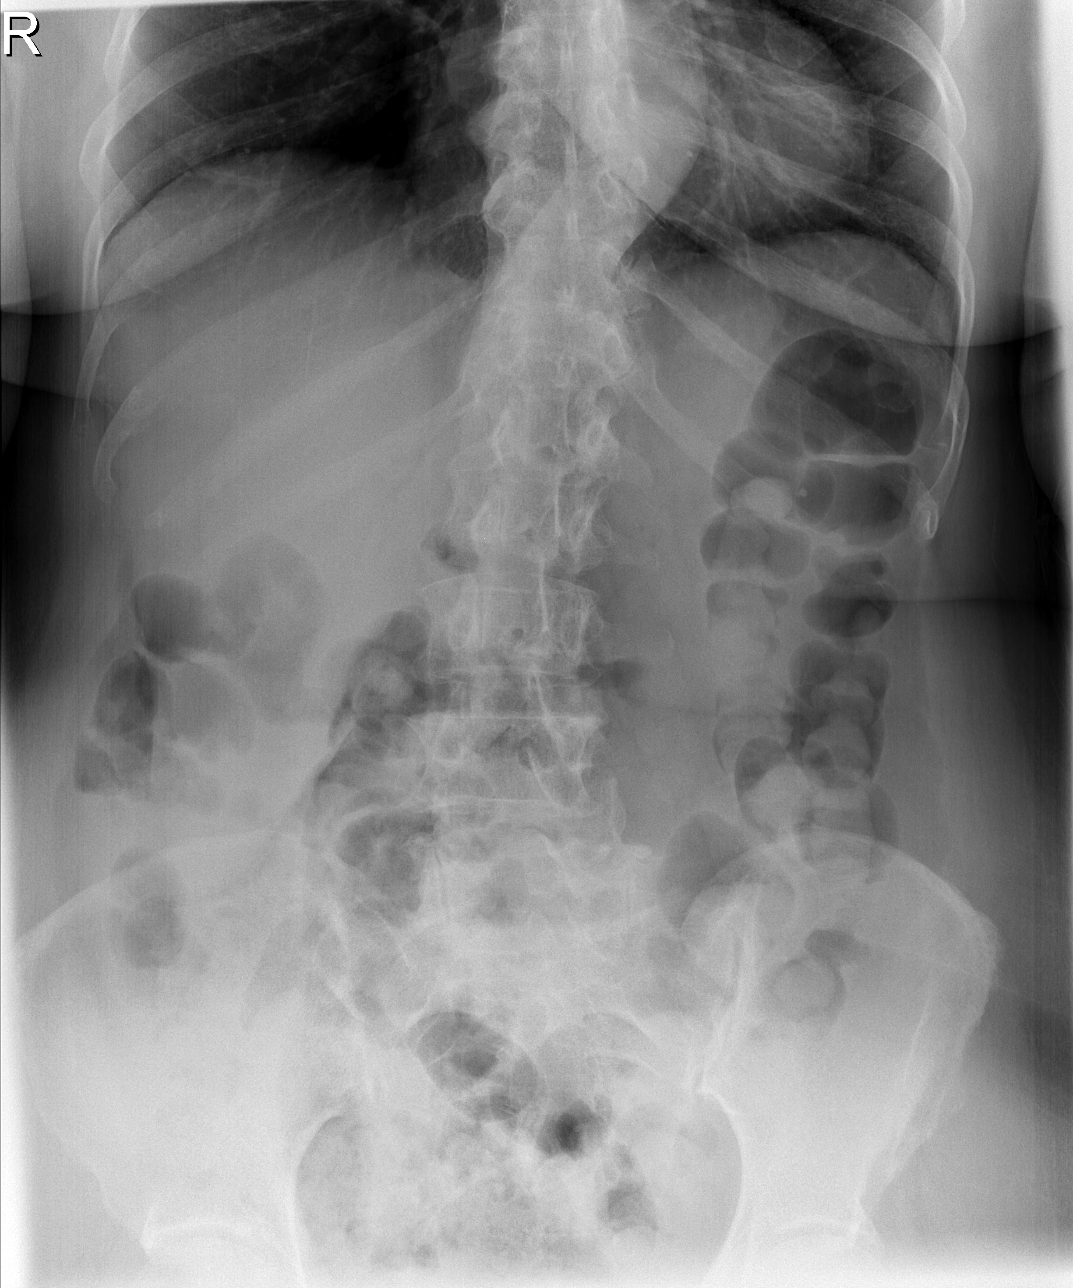

[t abdomen supine]
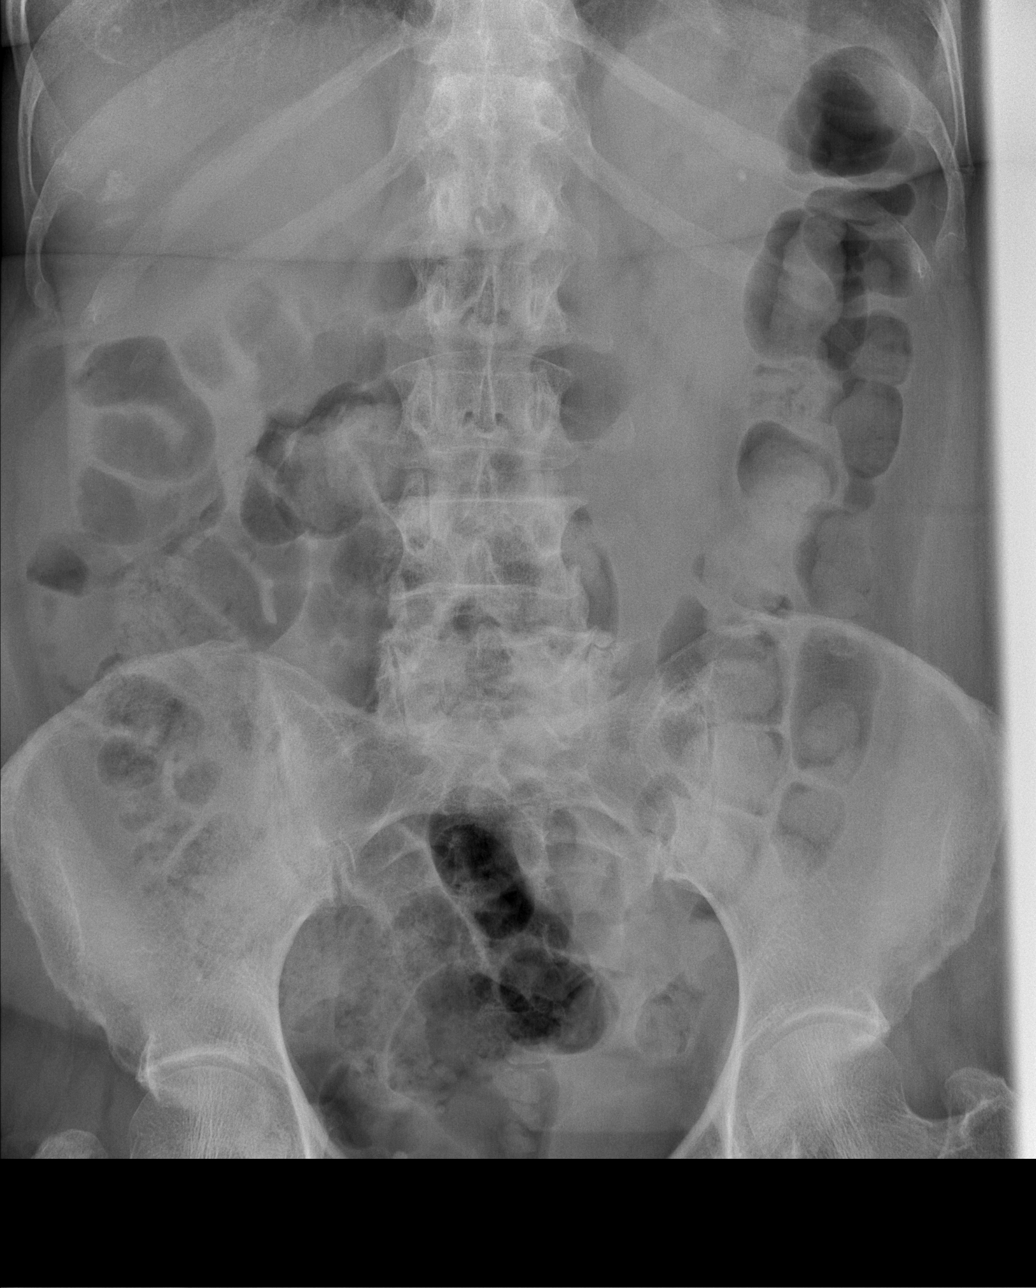

[3 of 3 positions shown; findings below may reference images not displayed]

FINDINGS: Lungs are clear.  No pleural effusion or pneumothorax.

The heart is top-normal in size.

Nonobstructive bowel gas pattern. Mild to moderate colonic stool
burden.

No evidence of free air under the diaphragm on the upright view.

Mild degenerative changes the lower lumbar spine.
IMPRESSION: No evidence of acute cardiopulmonary disease.

No evidence of small bowel obstruction or free air.

Mild to moderate colonic stool burden.

## 2015-08-17 DIAGNOSIS — H2512 Age-related nuclear cataract, left eye: Secondary | ICD-10-CM | POA: Diagnosis not present

## 2015-08-17 DIAGNOSIS — H18413 Arcus senilis, bilateral: Secondary | ICD-10-CM | POA: Diagnosis not present

## 2015-08-17 DIAGNOSIS — H25011 Cortical age-related cataract, right eye: Secondary | ICD-10-CM | POA: Diagnosis not present

## 2015-08-17 DIAGNOSIS — H2511 Age-related nuclear cataract, right eye: Secondary | ICD-10-CM | POA: Diagnosis not present

## 2015-08-27 ENCOUNTER — Other Ambulatory Visit: Payer: Self-pay | Admitting: Internal Medicine

## 2015-08-30 DIAGNOSIS — H25811 Combined forms of age-related cataract, right eye: Secondary | ICD-10-CM | POA: Diagnosis not present

## 2015-08-30 DIAGNOSIS — H2511 Age-related nuclear cataract, right eye: Secondary | ICD-10-CM | POA: Diagnosis not present

## 2015-08-31 DIAGNOSIS — H2512 Age-related nuclear cataract, left eye: Secondary | ICD-10-CM | POA: Diagnosis not present

## 2015-11-01 ENCOUNTER — Encounter: Payer: Self-pay | Admitting: Family Medicine

## 2015-11-26 ENCOUNTER — Other Ambulatory Visit: Payer: Self-pay | Admitting: Internal Medicine

## 2016-01-08 ENCOUNTER — Telehealth: Payer: Self-pay | Admitting: Internal Medicine

## 2016-01-08 DIAGNOSIS — R300 Dysuria: Secondary | ICD-10-CM | POA: Diagnosis not present

## 2016-01-08 NOTE — Telephone Encounter (Signed)
Rx refill sent to pharmacy. 

## 2016-01-08 NOTE — Telephone Encounter (Signed)
° °  Pt req refill    busPIRone (BUSPAR) 15 MG tablet   pharmacy [facility]

## 2016-01-14 ENCOUNTER — Encounter: Payer: Self-pay | Admitting: Internal Medicine

## 2016-01-14 ENCOUNTER — Ambulatory Visit (INDEPENDENT_AMBULATORY_CARE_PROVIDER_SITE_OTHER): Payer: Medicare HMO | Admitting: Internal Medicine

## 2016-01-14 VITALS — BP 118/78 | HR 54 | Temp 98.4°F | Ht 62.0 in | Wt 177.0 lb

## 2016-01-14 DIAGNOSIS — M159 Polyosteoarthritis, unspecified: Secondary | ICD-10-CM

## 2016-01-14 DIAGNOSIS — I10 Essential (primary) hypertension: Secondary | ICD-10-CM

## 2016-01-14 DIAGNOSIS — F0390 Unspecified dementia without behavioral disturbance: Secondary | ICD-10-CM

## 2016-01-14 DIAGNOSIS — R55 Syncope and collapse: Secondary | ICD-10-CM

## 2016-01-14 DIAGNOSIS — F03A0 Unspecified dementia, mild, without behavioral disturbance, psychotic disturbance, mood disturbance, and anxiety: Secondary | ICD-10-CM

## 2016-01-14 DIAGNOSIS — M150 Primary generalized (osteo)arthritis: Secondary | ICD-10-CM

## 2016-01-14 DIAGNOSIS — R69 Illness, unspecified: Secondary | ICD-10-CM | POA: Diagnosis not present

## 2016-01-14 LAB — BASIC METABOLIC PANEL
BUN: 20 mg/dL (ref 6–23)
CALCIUM: 9.8 mg/dL (ref 8.4–10.5)
CO2: 28 mEq/L (ref 19–32)
CREATININE: 1.7 mg/dL — AB (ref 0.40–1.20)
Chloride: 107 mEq/L (ref 96–112)
GFR: 37.16 mL/min — AB (ref >60.00)
GLUCOSE: 104 mg/dL — AB (ref 70–99)
POTASSIUM: 4.7 meq/L (ref 3.5–5.1)
Sodium: 142 mEq/L (ref 135–145)

## 2016-01-14 NOTE — Progress Notes (Signed)
Subjective:    Patient ID: Evelyn Irwin, female    DOB: [birth year], 82 y.o.   MRN: [id number]  HPI 82 year old patient who is seen today for follow-up.  She has essential hypertension She has a history of recurrent syncope.  She states that she has had 3 episodes over the past year including an episode in January and another last month. She states these occur without warning and she usually awakes on the floor.  Apparently there had been no witnessed episodes.  There is been no history of significant trauma.  Her husband does give a history of some brief confusion following these episodes.  She is scheduled for neurology follow-up soon.  She has been evaluated by cardiology  She was seen at the urgent care recently and is completing antibiotic therapy for a UTI She has a history mild cognitive impairment  Past Medical History:  Diagnosis Date  . ALLERGIC RHINITIS 11/11/2006  . ANXIETY 11/11/2006  . CONSTIPATION 02/23/2008  . GERD 10/06/2007  . HYPERTENSION 11/11/2006  . HYPOKALEMIA, HX OF 02/09/2008  . Memory loss   . OSTEOARTHRITIS 11/11/2006     Social History   Social History  . Marital status: Married    Spouse name: N/A  . Number of children: N/A  . Years of education: N/A   Occupational History  . Not on file.   Social History Main Topics  . Smoking status: Never Smoker  . Smokeless tobacco: Never Used  . Alcohol use No  . Drug use: No  . Sexual activity: Not on file   Other Topics Concern  . Not on file   Social History Narrative  . No narrative on file    Past Surgical History:  Procedure Laterality Date  . EP IMPLANTABLE DEVICE N/A 03/21/2015   Procedure: Loop Recorder Removal;  Surgeon: Hillis RangeJames Allred, MD;  Location: [facility];  Service: Cardiovascular;  Laterality: N/A;  . LOOP RECORDER IMPLANT N/A 03/30/2014   MDT LINQ implanted by Dr Johney FrameAllred for syncope  . TOTAL KNEE ARTHROPLASTY     Right    Family History  Problem Relation Age of Onset  .  Stroke Mother   . Stroke Father   . Cancer Sister   . Stroke Brother   . Stroke Brother     No Known Allergies  Current Outpatient Prescriptions on File Prior to Visit  Medication Sig Dispense Refill  . amLODipine (NORVASC) 10 MG tablet TAKE 1 TABLET BY MOUTH EVERY DAY 90 tablet 1  . busPIRone (BUSPAR) 15 MG tablet TAKE 1/2 TABLET BY MOUTH EVERY DAY 90 tablet 0  . donepezil (ARICEPT) 5 MG tablet Take 5 mg by mouth at bedtime.    . metoprolol tartrate (LOPRESSOR) 25 MG tablet TAKE 1 TABLET BY MOUTH TWICE A DAY 180 tablet 0  . Misc Natural Products (ADRENAL PO) Take 1 tablet by mouth daily as needed (SINUSES).     . Multiple Vitamin (MULTIVITAMIN) tablet Take 1 tablet by mouth daily.    Marland Kitchen. telmisartan (MICARDIS) 80 MG tablet TAKE 1 TABLET BY MOUTH EVERY DAY 90 tablet 1  . potassium chloride SA (K-DUR,KLOR-CON) 20 MEQ tablet Take 1 tablet (20 mEq total) by mouth daily. (Patient not taking: Reported on 01/14/2016) 90 tablet 3   No current facility-administered medications on file prior to visit.     BP 118/78 (BP Location: Left Arm, Patient Position: Sitting, Cuff Size: Normal)   Pulse (!) 54   Temp 98.4 F (36.9 C) (Oral)  Ht 5\' 2"  (1.575 m)   Wt 177 lb (80.3 kg)   SpO2 96%   BMI 32.37 kg/m      Review of Systems  Constitutional: Negative.   HENT: Negative for congestion, dental problem, hearing loss, rhinorrhea, sinus pressure, sore throat and tinnitus.   Eyes: Negative for pain, discharge and visual disturbance.  Respiratory: Negative for cough and shortness of breath.   Cardiovascular: Negative for chest pain, palpitations and leg swelling.  Gastrointestinal: Negative for abdominal distention, abdominal pain, blood in stool, constipation, diarrhea, nausea and vomiting.  Genitourinary: Negative for difficulty urinating, dysuria, flank pain, frequency, hematuria, pelvic pain, urgency, vaginal bleeding, vaginal discharge and vaginal pain.  Musculoskeletal: Negative for  arthralgias, gait problem and joint swelling.  Skin: Negative for rash.  Neurological: Positive for syncope. Negative for dizziness, speech difficulty, weakness, numbness and headaches.  Hematological: Negative for adenopathy.  Psychiatric/Behavioral: Positive for decreased concentration. Negative for agitation, behavioral problems and dysphoric mood. The patient is not nervous/anxious.        Objective:   Physical Exam  Constitutional: She is oriented to person, place, and time. She appears well-developed and well-nourished.  HENT:  Head: Normocephalic.  Right Ear: External ear normal.  Left Ear: External ear normal.  Mouth/Throat: Oropharynx is clear and moist.  Eyes: Conjunctivae and EOM are normal. Pupils are equal, round, and reactive to light.  Neck: Normal range of motion. Neck supple. No thyromegaly present.  Cardiovascular: Normal rate, regular rhythm, normal heart sounds and intact distal pulses.   Pulmonary/Chest: Effort normal and breath sounds normal.  Abdominal: Soft. Bowel sounds are normal. She exhibits no mass. There is no tenderness.  Musculoskeletal: Normal range of motion.  Lymphadenopathy:    She has no cervical adenopathy.  Neurological: She is alert and oriented to person, place, and time.  Normal finger to nose testing  Skin: Skin is warm and dry. No rash noted.  Psychiatric: She has a normal mood and affect. Her behavior is normal.          Assessment & Plan:   Essential hypertension.  Blood pressure well controlled.  No orthostatic changes History recurrent syncope.  Patient has had 3 episodes over the past 12 months.  Follow-up neurology History of hypokalemia.  Unclear whether the patient is on a potassium supplement.  We'll check electrolytes  CPX 6 months Follow-up neurology  Rogelia BogaKWIATKOWSKI,PETER FRANK, MD

## 2016-01-14 NOTE — Progress Notes (Signed)
Pre visit review using our clinic review tool, if applicable. No additional management support is needed unless otherwise documented below in the visit note. 

## 2016-01-14 NOTE — Patient Instructions (Signed)
Limit your sodium (Salt) intake  Follow-up neurology as scheduled    It is important that you exercise regularly, at least 20 minutes 3 to 4 times per week.  If you develop chest pain or shortness of breath seek  medical attention.  Please check your blood pressure on a regular basis.  If it is consistently greater than 150/90, please make an office appointment.  Return in 6 months for follow-up

## 2016-01-23 DIAGNOSIS — Z961 Presence of intraocular lens: Secondary | ICD-10-CM | POA: Diagnosis not present

## 2016-01-23 DIAGNOSIS — H2512 Age-related nuclear cataract, left eye: Secondary | ICD-10-CM | POA: Diagnosis not present

## 2016-02-12 ENCOUNTER — Ambulatory Visit (INDEPENDENT_AMBULATORY_CARE_PROVIDER_SITE_OTHER): Payer: Medicare HMO | Admitting: Internal Medicine

## 2016-02-12 ENCOUNTER — Encounter: Payer: Self-pay | Admitting: Internal Medicine

## 2016-02-12 VITALS — BP 160/90 | HR 68 | Temp 98.3°F | Resp 20 | Ht 62.0 in | Wt 178.0 lb

## 2016-02-12 DIAGNOSIS — G3184 Mild cognitive impairment, so stated: Secondary | ICD-10-CM | POA: Diagnosis not present

## 2016-02-12 DIAGNOSIS — Z8639 Personal history of other endocrine, nutritional and metabolic disease: Secondary | ICD-10-CM

## 2016-02-12 DIAGNOSIS — I10 Essential (primary) hypertension: Secondary | ICD-10-CM

## 2016-02-12 LAB — BASIC METABOLIC PANEL
BUN: 18 mg/dL (ref 6–23)
CHLORIDE: 106 meq/L (ref 96–112)
CO2: 32 meq/L (ref 19–32)
Calcium: 9.3 mg/dL (ref 8.4–10.5)
Creatinine, Ser: 1.03 mg/dL (ref 0.40–1.20)
GFR: 66.24 mL/min (ref >60.00)
GLUCOSE: 103 mg/dL — AB (ref 70–99)
POTASSIUM: 3.3 meq/L — AB (ref 3.5–5.1)
Sodium: 144 mEq/L (ref 135–145)

## 2016-02-12 MED ORDER — CLONIDINE HCL 0.1 MG PO TABS: ORAL_TABLET | ORAL | 3 refills | Status: DC

## 2016-02-12 MED ORDER — METOPROLOL TARTRATE 50 MG PO TABS: 50.0000 mg | ORAL_TABLET | Freq: Two times a day (BID) | ORAL | 2 refills | Status: DC

## 2016-02-12 NOTE — Progress Notes (Signed)
Subjective:    Patient ID: Evelyn Irwin, female    DOB: 31-Jul-1933, 82 y.o.   MRN: [id number]  HPI 82 year old patient who has a history of hypertension.  She was seen 1 month ago and follow-up laboratory studies revealed elevated creatinine level.  Micardis was placed on hold and potassium supplementation discontinued.  Medical regimen also includes amlodipine and metoprolol.  She states that she has not taken her medications today.  She feels well  Past Medical History:  Diagnosis Date  . ALLERGIC RHINITIS 11/11/2006  . ANXIETY 11/11/2006  . CONSTIPATION 02/23/2008  . GERD 10/06/2007  . HYPERTENSION 11/11/2006  . HYPOKALEMIA, HX OF 02/09/2008  . Memory loss   . OSTEOARTHRITIS 11/11/2006     Social History   Social History  . Marital status: Married    Spouse name: N/A  . Number of children: N/A  . Years of education: N/A   Occupational History  . Not on file.   Social History Main Topics  . Smoking status: Never Smoker  . Smokeless tobacco: Never Used  . Alcohol use No  . Drug use: No  . Sexual activity: Not on file   Other Topics Concern  . Not on file   Social History Narrative  . No narrative on file    Past Surgical History:  Procedure Laterality Date  . EP IMPLANTABLE DEVICE N/A 03/21/2015   Procedure: Loop Recorder Removal;  Surgeon: Hillis Range, MD;  Location: [facility];  Service: Cardiovascular;  Laterality: N/A;  . LOOP RECORDER IMPLANT N/A 03/30/2014   MDT LINQ implanted by Dr Johney Frame for syncope  . TOTAL KNEE ARTHROPLASTY     Right    Family History  Problem Relation Age of Onset  . Stroke Mother   . Stroke Father   . Cancer Sister   . Stroke Brother   . Stroke Brother     No Known Allergies  Current Outpatient Prescriptions on File Prior to Visit  Medication Sig Dispense Refill  . amLODipine (NORVASC) 10 MG tablet TAKE 1 TABLET BY MOUTH EVERY DAY 90 tablet 1  . busPIRone (BUSPAR) 15 MG tablet TAKE 1/2 TABLET BY MOUTH EVERY DAY 90  tablet 0  . donepezil (ARICEPT) 5 MG tablet Take 5 mg by mouth at bedtime.    . metoprolol tartrate (LOPRESSOR) 25 MG tablet TAKE 1 TABLET BY MOUTH TWICE A DAY 180 tablet 0  . Misc Natural Products (ADRENAL PO) Take 1 tablet by mouth daily as needed (SINUSES).     . Multiple Vitamin (MULTIVITAMIN) tablet Take 1 tablet by mouth daily.     No current facility-administered medications on file prior to visit.     BP (!) 160/90 (BP Location: Left Arm, Patient Position: Sitting, Cuff Size: Normal)   Pulse 68   Temp 98.3 F (36.8 C) (Oral)   Resp 20   Ht 5\' 2"  (1.575 m)   Wt 178 lb (80.7 kg)   SpO2 98%   BMI 32.56 kg/m      Review of Systems  Constitutional: Negative.   HENT: Negative for congestion, dental problem, hearing loss, rhinorrhea, sinus pressure, sore throat and tinnitus.   Eyes: Negative for pain, discharge and visual disturbance.  Respiratory: Negative for cough and shortness of breath.   Cardiovascular: Negative for chest pain, palpitations and leg swelling.  Gastrointestinal: Negative for abdominal distention, abdominal pain, blood in stool, constipation, diarrhea, nausea and vomiting.  Genitourinary: Negative for difficulty urinating, dysuria, flank pain, frequency, hematuria, pelvic pain,  urgency, vaginal bleeding, vaginal discharge and vaginal pain.  Musculoskeletal: Negative for arthralgias, gait problem and joint swelling.  Skin: Negative for rash.  Neurological: Negative for dizziness, syncope, speech difficulty, weakness, numbness and headaches.  Hematological: Negative for adenopathy.  Psychiatric/Behavioral: Negative for agitation, behavioral problems and dysphoric mood. The patient is not nervous/anxious.        Objective:   Physical Exam  Constitutional: She is oriented to person, place, and time. She appears well-developed and well-nourished.  Blood pressure on arrival 160 over 90 Blood pressure as high as 190 over 100  HENT:  Head: Normocephalic.    Right Ear: External ear normal.  Left Ear: External ear normal.  Mouth/Throat: Oropharynx is clear and moist.  Eyes: Conjunctivae and EOM are normal. Pupils are equal, round, and reactive to light.  Neck: Normal range of motion. Neck supple. No thyromegaly present.  Cardiovascular: Normal rate, regular rhythm, normal heart sounds and intact distal pulses.   Pulmonary/Chest: Effort normal and breath sounds normal.  Abdominal: Soft. Bowel sounds are normal. She exhibits no mass. There is no tenderness.  Musculoskeletal: Normal range of motion.  Lymphadenopathy:    She has no cervical adenopathy.  Neurological: She is alert and oriented to person, place, and time.  Skin: Skin is warm and dry. No rash noted.  Psychiatric: She has a normal mood and affect. Her behavior is normal.          Assessment & Plan:   Hypertension, poor control.  Will repeat electrolytes.  Continue amlodipine 10 mg daily.  Compliance with medications.  Stressed Increase metoprolol to 50 mg twice a day Catapres 0.1 milligrams at bedtime  Recheck 2 weeks  Rogelia BogaKWIATKOWSKI,PETER FRANK

## 2016-02-12 NOTE — Progress Notes (Signed)
Pre visit review using our clinic review tool, if applicable. No additional management support is needed unless otherwise documented below in the visit note. 

## 2016-02-12 NOTE — Patient Instructions (Signed)
Limit your sodium (Salt) intake  Return in 2 weeks for follow-up  Please take all your medications faithfully;  never omit any blood pressure medications  Please check your blood pressure on a regular basis.  If it is consistently greater than 150/90, please make an office appointment.

## 2016-02-15 ENCOUNTER — Telehealth: Payer: Self-pay | Admitting: Internal Medicine

## 2016-02-15 ENCOUNTER — Ambulatory Visit: Payer: Medicare HMO | Admitting: Neurology

## 2016-02-15 NOTE — Telephone Encounter (Signed)
Evelyn Irwin pt returning the call from earlier in the week.

## 2016-02-15 NOTE — Telephone Encounter (Signed)
See result note.  

## 2016-02-20 ENCOUNTER — Ambulatory Visit (INDEPENDENT_AMBULATORY_CARE_PROVIDER_SITE_OTHER): Payer: Medicare HMO | Admitting: Neurology

## 2016-02-20 ENCOUNTER — Encounter: Payer: Self-pay | Admitting: Neurology

## 2016-02-20 VITALS — BP 142/78 | HR 63 | Temp 98.1°F | Ht 62.0 in | Wt 178.4 lb

## 2016-02-20 DIAGNOSIS — F0390 Unspecified dementia without behavioral disturbance: Secondary | ICD-10-CM | POA: Diagnosis not present

## 2016-02-20 DIAGNOSIS — F03A0 Unspecified dementia, mild, without behavioral disturbance, psychotic disturbance, mood disturbance, and anxiety: Secondary | ICD-10-CM

## 2016-02-20 DIAGNOSIS — R55 Syncope and collapse: Secondary | ICD-10-CM

## 2016-02-20 MED ORDER — DIVALPROEX SODIUM ER 250 MG PO TB24: ORAL_TABLET | ORAL | 6 refills | Status: DC

## 2016-02-20 NOTE — Patient Instructions (Signed)
1. Start Depakote ER 250mg : take 1 tablet at night 2. Continue Donepezil 5mg : take 2 tablets daily 3. Follow-up with Dr. Theodis AguasMhoon as scheduled 4. Follow-up in 4 months, call for any changes

## 2016-02-20 NOTE — Progress Notes (Signed)
NEUROLOGY FOLLOW UP OFFICE NOTE  Makaia Rappa [id number]  HISTORY OF PRESENT ILLNESS: I had the pleasure of seeing Rakeb Kibble in follow-up in the neurology clinic on 02/20/2016. The patient was last seen 8 months ago and is again accompanied by her husband who helps supplement the history today. Since her last visit, they report another episode of loss of consciousness in July which was unwitnessed. She recalls getting up early to make coffee, turning in her kitchen, then waking up on the floor. No tongue bite or incontinence, no injuries. She denied any prior warning symptoms except for a "lightning" on the left side of her head, it was not painful but felt like a flash. She denied any focal weakness, she was able to get up but did not feel right. She had the cardiac monitor taken out in the past, episodes captured did not show any associated arrhythmia. Her husband denies any staring/unresponsive episodes, she denies any olfactory/gustatory hallucinations, rising epigastric sensation, myoclonic jerks. Her husband reports memory loss, one time she could not find her glasses and called the police. She states that "he exaggerates." He reports that if she can't find something, she thinks that he moved it, or takes things and hides them. She misplaces things frequently. She does not drive. She had been seen at [facility] for a second opinion on these spells, felt to be possibly due to neurodegenerative condition. She is taking Aricept without side effects. She denies any headaches, dizziness, vision changes, bowel/bladder dysfunction, focal numbness/tingling/weakness.  HPI: This is an 82 yo RH woman with a history of hypertension presenting with recurrent syncopal episodes. The first episode occurred in 2013 when she woke up on the kitchen floor confused. She was admitted to [facility] where workup was unrevealing. Echo had shown basal septal hypertrophy and dynamic obstruction. She reports had another episode last  August 2015 where she woke up on the grass, again feeling confused after. No tongue bite or urinary incontinence. She has chronic tinnitus and reports that the ringing in her ears may have gotten a little louder. She denied feeling dizzy or diaphoretic prior to the episodes. She has had an ILR since October 2015. Her head CT was unremarkable. Routine EEG showed occasional left temporal slowing, however her 24-hour EEG did not show similar changes and was within normal limits. She was unable to get an MRI due to loop recorder. She has seen cardiology with normal EF. She had another event last 09/25/14 while she was lying on bed watching a ballgame. She has chronic tinnitus but recalls the tinnitus increased, then when she came to, she was lying on her back and felt very confused. She got up and went to the bathroom and felt she did not recognize anything. She walked through all the bedrooms looking for "that man" (her husband), then her memory started coming back. She saw "that man sitting on the chair," but did not speak to him and went back to the room. When he came to find her 20 minutes later, her symptoms had cleared but she did not feel "quite right." No tongue bite or incontinence. She denied any associated headache but thinks she felt this episode coming on a few days prior because she was having increased ringing in her ears for 2 days in a row (no associated confusion). Loop recorder did not indicate any cardiac abnormalities.  She and her husband recall a curious episode in 2010 or 2011 when she suddenly felt sleepy and fell across the bed.  When she got up she did not know where and who she was, did not recognize their kitchen. She went back to sleep and woke up fine.   She feels that since the syncopal episode in August 2015, she does not remember things like she used to. They were on a cruise last November, and she forgot they were onboard a ship. She would move things around in their house and they  cannot find them. She has only missed 1 bill payment, denies missing medications. No family history of similar symptoms.   Diagnostic Data: Head CT which did not show any acute changes. Her routine EEG had shown non-specific slowing in the left temporal region, a 24-hour EEG did not show similar findings, and was within normal limits.   PAST MEDICAL HISTORY: Past Medical History:  Diagnosis Date  . ALLERGIC RHINITIS 11/11/2006  . ANXIETY 11/11/2006  . CONSTIPATION 02/23/2008  . GERD 10/06/2007  . HYPERTENSION 11/11/2006  . HYPOKALEMIA, HX OF 02/09/2008  . Memory loss   . OSTEOARTHRITIS 11/11/2006    MEDICATIONS: Current Outpatient Prescriptions on File Prior to Visit  Medication Sig Dispense Refill  . amLODipine (NORVASC) 10 MG tablet TAKE 1 TABLET BY MOUTH EVERY DAY 90 tablet 1  . busPIRone (BUSPAR) 15 MG tablet TAKE 1/2 TABLET BY MOUTH EVERY DAY 90 tablet 0  . cloNIDine (CATAPRES) 0.1 MG tablet 1 tablet daily at bedtime 90 tablet 3  . donepezil (ARICEPT) 5 MG tablet Take 5 mg by mouth at bedtime.    . metoprolol tartrate (LOPRESSOR) 50 MG tablet Take 1 tablet (50 mg total) by mouth 2 (two) times daily. 180 tablet 2  . Misc Natural Products (ADRENAL PO) Take 1 tablet by mouth daily as needed (SINUSES).     . Multiple Vitamin (MULTIVITAMIN) tablet Take 1 tablet by mouth daily.     No current facility-administered medications on file prior to visit.     ALLERGIES: No Known Allergies  FAMILY HISTORY: Family History  Problem Relation Age of Onset  . Stroke Mother   . Stroke Father   . Cancer Sister   . Stroke Brother   . Stroke Brother     SOCIAL HISTORY: Social History   Social History  . Marital status: Married    Spouse name: N/A  . Number of children: N/A  . Years of education: N/A   Occupational History  . Not on file.   Social History Main Topics  . Smoking status: Never Smoker  . Smokeless tobacco: Never Used  . Alcohol use No  . Drug use: No  . Sexual  activity: Not on file   Other Topics Concern  . Not on file   Social History Narrative  . No narrative on file    REVIEW OF SYSTEMS: Constitutional: No fevers, chills, or sweats, no generalized fatigue, change in appetite Eyes: No visual changes, double vision, eye pain Ear, nose and throat: No hearing loss, ear pain, nasal congestion, sore throat Cardiovascular: No chest pain, palpitations Respiratory:  No shortness of breath at rest or with exertion, wheezes GastrointestinaI: No nausea, vomiting, diarrhea, abdominal pain, fecal incontinence Genitourinary:  No dysuria, urinary retention or frequency Musculoskeletal:  No neck pain, back pain Integumentary: No rash, pruritus, skin lesions Neurological: as above Psychiatric: No depression, insomnia, anxiety Endocrine: No palpitations, fatigue, diaphoresis, mood swings, change in appetite, change in weight, increased thirst Hematologic/Lymphatic:  No anemia, purpura, petechiae. Allergic/Immunologic: no itchy/runny eyes, nasal congestion, recent allergic reactions, rashes  PHYSICAL EXAM: Vitals:  02/20/16 1334  BP: (!) 142/78  Pulse: 63  Temp: 98.1 F (36.7 C)   General: No acute distress Head:  Normocephalic/atraumatic Neck: supple, no paraspinal tenderness, full range of motion Heart:  Regular rate and rhythm Lungs:  Clear to auscultation bilaterally Back: No paraspinal tenderness Skin/Extremities: No rash, no edema Neurological Exam: alert and oriented to person, place, and time. No aphasia or dysarthria. Fund of knowledge is appropriate.  Remote memory intact.  Attention and concentration are normal.    Able to name objects and repeat phrases. CDT 4/5 MMSE - Mini Mental State Exam 02/29/2016 06/15/2015 06/27/2014  Orientation to time 4 4 4   Orientation to Place 5 5 5   Registration 3 3 3   Attention/ Calculation 5 5 5   Recall 0 0 0  Language- name 2 objects 2 2 2   Language- repeat 1 1 1   Language- follow 3 step command 3  3 3   Language- read & follow direction 1 1 1   Write a sentence 1 1 1   Copy design 1 1 1   Total score 26 26 26    Cranial nerves: Pupils equal, round, reactive to light. Extraocular movements intact with no nystagmus. Visual fields full. Facial sensation intact. No facial asymmetry. Tongue, uvula, palate midline.  Motor: Bulk and tone normal, muscle strength 5/5 throughout with no pronator drift.  Sensation to light touch intact.  No extinction to double simultaneous stimulation.  Deep tendon reflexes 1+ throughout, toes downgoing.  Finger to nose testing intact.  Gait narrow-based and steady, slight difficulty with tandem walk but able (similar to prior).  Romberg negative.  IMPRESSION: This is an 82 yo RH woman with a history of hypertension and recurrent syncope of unclear etiology. No clear epilepsy risk factors, neurological exam is normal. Her head CT was unremarkable. Routine EEG had shown occasional left temporal slowing, however her 24-hour EEG did not show similar changes, typical events were not captured. Cardiac workup unrevealing, with no arrhythmia noted on loop monitor during an event. She has been evaluated at [facility], concern for confusional episodes due to neurodegenerative disorder. Neurocognitive testing at [facility] showing dementia per reports. MMSE today again 26/30. She is taking Aricept 5mg  daily as prescribed by Dr. Theodis Aguas. Since her last visit, they report another syncopal episode in July. The etiology of these spells is unclear, with her visit with Dr. Theodis Aguas, consideration for a therapeutic trial with seizure medication for presumed seizures had been discussed, which I had also similarly discussed with the patient and her husband in the past. With her husband's report of more paranoia with underlying dementia, we have agreed to start Depakote low dose 250mg  qhs for both behavior as well as possible seizures. Side effects were discussed. She is aware of [place] driving laws to stop driving after  an episode of loss of consciousness, until 6 months event-free. She will follow-up in 4 months and knows to call for any changes.   Thank you for allowing me to participate in her care.  Please do not hesitate to call for any questions or concerns.  The duration of this appointment visit was 25 minutes of face-to-face time with the patient.  Greater than 50% of this time was spent in counseling, explanation of diagnosis, planning of further management, and coordination of care.   Patrcia Dolly, M.D.   CC: Dr. Amador Cunas

## 2016-02-21 ENCOUNTER — Other Ambulatory Visit: Payer: Self-pay | Admitting: Internal Medicine

## 2016-02-22 ENCOUNTER — Telehealth: Payer: Self-pay | Admitting: Internal Medicine

## 2016-02-22 NOTE — Telephone Encounter (Signed)
Pt is a little confused pt was prescribed divalproex by dr Amador Cunaskwiatkowski  and clonidine by dr Karel Jarvisaquino. Pt say the pham said both med are for the same reason.. Please advise

## 2016-02-25 NOTE — Telephone Encounter (Signed)
Continue both medications, which are different I prescribed clonidine for blood pressure control Neurology prescribed the other medication for her episodes of confusion

## 2016-02-25 NOTE — Telephone Encounter (Signed)
Please see message and advise 

## 2016-02-25 NOTE — Telephone Encounter (Signed)
Spoke to pt, told her Dr.K said Continue both medications, which are different. I prescribed clonidine for blood pressure control. Neurology prescribed the other medication for her episodes of confusion. Pt verbalized understanding and said she stopped both due to developing a rash and she has an appt on Friday with Dr.K and will discuss it then. Told her okay. Dr.K made aware.

## 2016-02-29 ENCOUNTER — Encounter: Payer: Self-pay | Admitting: Neurology

## 2016-02-29 ENCOUNTER — Ambulatory Visit (INDEPENDENT_AMBULATORY_CARE_PROVIDER_SITE_OTHER): Payer: Medicare HMO | Admitting: Internal Medicine

## 2016-02-29 ENCOUNTER — Encounter: Payer: Self-pay | Admitting: Internal Medicine

## 2016-02-29 VITALS — BP 154/72 | HR 58 | Temp 97.7°F | Resp 20 | Ht 62.0 in | Wt 178.2 lb

## 2016-02-29 DIAGNOSIS — I10 Essential (primary) hypertension: Secondary | ICD-10-CM

## 2016-02-29 DIAGNOSIS — R55 Syncope and collapse: Secondary | ICD-10-CM | POA: Diagnosis not present

## 2016-02-29 DIAGNOSIS — G3184 Mild cognitive impairment, so stated: Secondary | ICD-10-CM | POA: Diagnosis not present

## 2016-02-29 NOTE — Patient Instructions (Signed)
Limit your sodium (Salt) intake  Please check your blood pressure on a regular basis.  If it is consistently greater than 150/90, please make an office appointment.     It is important that you exercise regularly, at least 20 minutes 3 to 4 times per week.  If you develop chest pain or shortness of breath seek  medical attention. 

## 2016-02-29 NOTE — Progress Notes (Signed)
Subjective:    Patient ID: Evelyn Irwin, female    DOB: March 01, 1934, 82 y.o.   MRN: [id number]  HPI  82 year old patient who is seen today for follow-up of hypertension.  She was seen recently with elevated blood pressure readings.  She claims compliance with all her medications.  She was placed on clonidine 0.1 milligram at bedtime. She is also seen by neurology recently and placed on Depakote.  She states after a single dose.  She had allergic reaction to included urticaria.  She has discontinued this medication In general doing well.  No recurrent syncope  Past Medical History:  Diagnosis Date  . ALLERGIC RHINITIS 11/11/2006  . ANXIETY 11/11/2006  . CONSTIPATION 02/23/2008  . GERD 10/06/2007  . HYPERTENSION 11/11/2006  . HYPOKALEMIA, HX OF 02/09/2008  . Memory loss   . OSTEOARTHRITIS 11/11/2006     Social History   Social History  . Marital status: Married    Spouse name: N/A  . Number of children: N/A  . Years of education: N/A   Occupational History  . Not on file.   Social History Main Topics  . Smoking status: Never Smoker  . Smokeless tobacco: Never Used  . Alcohol use No  . Drug use: No  . Sexual activity: Not on file   Other Topics Concern  . Not on file   Social History Narrative  . No narrative on file    Past Surgical History:  Procedure Laterality Date  . EP IMPLANTABLE DEVICE N/A 03/21/2015   Procedure: Loop Recorder Removal;  Surgeon: Evelyn Range, MD;  Location: [facility];  Service: Cardiovascular;  Laterality: N/A;  . LOOP RECORDER IMPLANT N/A 03/30/2014   MDT LINQ implanted by Dr Evelyn Irwin for syncope  . TOTAL KNEE ARTHROPLASTY     Right    Family History  Problem Relation Age of Onset  . Stroke Mother   . Stroke Father   . Cancer Sister   . Stroke Brother   . Stroke Brother     No Known Allergies  Current Outpatient Prescriptions on File Prior to Visit  Medication Sig Dispense Refill  . amLODipine (NORVASC) 10 MG tablet TAKE 1  TABLET BY MOUTH EVERY DAY 90 tablet 1  . busPIRone (BUSPAR) 15 MG tablet TAKE 1/2 TABLET BY MOUTH EVERY DAY 90 tablet 0  . cloNIDine (CATAPRES) 0.1 MG tablet 1 tablet daily at bedtime 90 tablet 3  . donepezil (ARICEPT) 5 MG tablet Take 5 mg by mouth at bedtime.    . metoprolol tartrate (LOPRESSOR) 50 MG tablet Take 1 tablet (50 mg total) by mouth 2 (two) times daily. 180 tablet 2  . Misc Natural Products (ADRENAL PO) Take 1 tablet by mouth daily as needed (SINUSES).     . Multiple Vitamin (MULTIVITAMIN) tablet Take 1 tablet by mouth daily.    Marland Kitchen telmisartan (MICARDIS) 80 MG tablet TAKE 1 TABLET BY MOUTH EVERY DAY 90 tablet 1   No current facility-administered medications on file prior to visit.     BP (!) 154/72 (BP Location: Left Arm, Patient Position: Sitting, Cuff Size: Large)   Pulse (!) 58   Temp 97.7 F (36.5 C) (Oral)   Resp 20   Ht 5\' 2"  (1.575 m)   Wt 178 lb 4 oz (80.9 kg)   SpO2 98%   BMI 32.60 kg/m     Review of Systems  Skin: Positive for rash.  Neurological: Positive for syncope.       Objective:  Physical Exam  Constitutional: She appears well-developed and well-nourished. No distress.  Blood pressure on arrival 154 over 72 Repeat blood pressure 140/82          Assessment & Plan:   Hypertension.  No change in medical regimen.  Low-salt diet home blood pressure monitoring.  Encouraged History syncope Depakote allergy.  Recheck 4 months  Evelyn Irwin

## 2016-02-29 NOTE — Progress Notes (Signed)
Pre visit review using our clinic review tool, if applicable. No additional management support is needed unless otherwise documented below in the visit note. 

## 2016-03-25 DIAGNOSIS — Z23 Encounter for immunization: Secondary | ICD-10-CM | POA: Diagnosis not present

## 2016-03-31 ENCOUNTER — Telehealth: Payer: Self-pay | Admitting: Internal Medicine

## 2016-03-31 NOTE — Telephone Encounter (Signed)
Spoke with pt and she states that since starting Clonidine she is having hallucinations at night. Pt has scheduled appt with Dr. Kirtland BouchardK for 04/01/16 to discuss. Nothing further needed at this time.

## 2016-03-31 NOTE — Telephone Encounter (Signed)
[facility] - Client TELEPHONE ADVICE RECORD [facility]  Patient Name: Evelyn Irwin  DOB: [birth year]    Initial Comment Caller was given medication and has been hallucinating. not sure what is causing it.    Nurse Assessment  Nurse: Dorthula RuePatten, RN, Enrique SackKendra Date/Time (Eastern Time): 03/31/2016 1:51:17 PM  Confirm and document reason for call. If symptomatic, describe symptoms. You must click the next button to save text entered. ---Caller states she was put on Clonidine HCL. She states she started in on 09/12. Caller states she has been hallucinating at night.  Has the patient traveled out of the country within the last 30 days? ---Not Applicable  Does the patient have any new or worsening symptoms? ---Yes  Will a triage be completed? ---Yes  Related visit to physician within the last 2 weeks? ---No  Does the PT have any chronic conditions? (i.e. diabetes, asthma, etc.) ---Yes  List chronic conditions. ---HTN  Is this a behavioral health or substance abuse call? ---No     Guidelines    Guideline Title Affirmed Question Affirmed Notes  Confusion - Delirium [1] Longstanding confusion (e.g., dementia, stroke) AND [2] NO worsening or change    Final Disposition User   See PCP within 2 Edwena BundeWeeks Patten, RN, Enrique SackKendra    Comments  Caller was not sure if she had dementia and I checked Epic and she does.  Scheduled with Dr. Kirtland BouchardK at 345p tomorrow, 10/31   Referrals  REFERRED TO PCP OFFICE   Disagree/Comply: Comply

## 2016-04-01 ENCOUNTER — Ambulatory Visit (INDEPENDENT_AMBULATORY_CARE_PROVIDER_SITE_OTHER): Payer: Medicare HMO | Admitting: Internal Medicine

## 2016-04-01 ENCOUNTER — Encounter: Payer: Self-pay | Admitting: Internal Medicine

## 2016-04-01 VITALS — BP 152/80 | HR 59 | Temp 98.4°F | Resp 18 | Ht 62.0 in | Wt 176.0 lb

## 2016-04-01 DIAGNOSIS — I10 Essential (primary) hypertension: Secondary | ICD-10-CM | POA: Diagnosis not present

## 2016-04-01 DIAGNOSIS — R55 Syncope and collapse: Secondary | ICD-10-CM

## 2016-04-01 DIAGNOSIS — R69 Illness, unspecified: Secondary | ICD-10-CM | POA: Diagnosis not present

## 2016-04-01 DIAGNOSIS — F0390 Unspecified dementia without behavioral disturbance: Secondary | ICD-10-CM

## 2016-04-01 DIAGNOSIS — F03A0 Unspecified dementia, mild, without behavioral disturbance, psychotic disturbance, mood disturbance, and anxiety: Secondary | ICD-10-CM

## 2016-04-01 NOTE — Progress Notes (Signed)
Subjective:    Patient ID: Evelyn Irwin, female    DOB: 10-Apr-1934, 82 y.o.   MRN: [id number]  HPI 82 year old patient who is seen today with a one-week history of visual and auditory hallucinations.  These usually occur late evening before retiring for bed. She has a history of mild cognitive dysfunction and has been evaluated by urology.  She also has a history of recurrent syncope.  The last episode in July of this year. She was last seen by neurology in September and placed on Depakote due to paranoid ideation.  She had a poor reaction after a single dose and this medication was discontinued. [facility] has [facility] and had formal neurocognitive testing with a diagnosis of mild dementia.  This was performed by Dr. Waynetta SandyMoon  and she is [facility] on [facility] 13.  MMSE performed today with a score of 29 out of 30.  The patient was unable to name the county Clock drawing test was also performed- .  She correctly drew  properly the 12 numbers of the face of the clock, but when asked to draw the time of 3:30, drew a straight line from the 3 to the 6.  Past Medical History:  Diagnosis Date  . ALLERGIC RHINITIS 11/11/2006  . ANXIETY 11/11/2006  . CONSTIPATION 02/23/2008  . GERD 10/06/2007  . HYPERTENSION 11/11/2006  . HYPOKALEMIA, HX OF 02/09/2008  . Memory loss   . OSTEOARTHRITIS 11/11/2006     Social History   Social History  . Marital status: Married    Spouse name: N/A  . Number of children: N/A  . Years of education: N/A   Occupational History  . Not on file.   Social History Main Topics  . Smoking status: Never Smoker  . Smokeless tobacco: Never Used  . Alcohol use No  . Drug use: No  . Sexual activity: Not on file   Other Topics Concern  . Not on file   Social History Narrative  . No narrative on file    Past Surgical History:  Procedure Laterality Date  . EP IMPLANTABLE DEVICE N/A 03/21/2015   Procedure: Loop Recorder Removal;  Surgeon:  Hillis RangeJames Allred, MD;  Location: [facility];  Service: Cardiovascular;  Laterality: N/A;  . LOOP RECORDER IMPLANT N/A 03/30/2014   MDT LINQ implanted by Dr Johney FrameAllred for syncope  . TOTAL KNEE ARTHROPLASTY     Right    Family History  Problem Relation Age of Onset  . Stroke Mother   . Stroke Father   . Cancer Sister   . Stroke Brother   . Stroke Brother     Allergies  Allergen Reactions  . Depakote Er [Divalproex Sodium Er] Hives    Current Outpatient Prescriptions on File Prior to Visit  Medication Sig Dispense Refill  . amLODipine (NORVASC) 10 MG tablet TAKE 1 TABLET BY MOUTH EVERY DAY 90 tablet 1  . busPIRone (BUSPAR) 15 MG tablet TAKE 1/2 TABLET BY MOUTH EVERY DAY 90 tablet 0  . cloNIDine (CATAPRES) 0.1 MG tablet 1 tablet daily at bedtime 90 tablet 3  . donepezil (ARICEPT) 5 MG tablet Take 5 mg by mouth at bedtime.    . metoprolol tartrate (LOPRESSOR) 50 MG tablet Take 1 tablet (50 mg total) by mouth 2 (two) times daily. 180 tablet 2  . telmisartan (MICARDIS) 80 MG tablet TAKE 1 TABLET BY MOUTH EVERY DAY 90 tablet 1  . Misc Natural Products (ADRENAL PO) Take 1  tablet by mouth daily as needed (SINUSES).      No current facility-administered medications on file prior to visit.     BP (!) 152/80 (BP Location: Left Arm, Patient Position: Sitting, Cuff Size: Normal)   Pulse (!) 59   Temp 98.4 F (36.9 C) (Oral)   Resp 18   Ht 5\' 2"  (1.575 m)   Wt 176 lb (79.8 kg)   SpO2 98%   BMI 32.19 kg/m      Review of Systems  Constitutional: Negative.   HENT: Negative for congestion, dental problem, hearing loss, rhinorrhea, sinus pressure, sore throat and tinnitus.   Eyes: Negative for pain, discharge and visual disturbance.  Respiratory: Negative for cough and shortness of breath.   Cardiovascular: Negative for chest pain, palpitations and leg swelling.  Gastrointestinal: Negative for abdominal distention, abdominal pain, blood in stool, constipation, diarrhea, nausea and  vomiting.  Genitourinary: Negative for difficulty urinating, dysuria, flank pain, frequency, hematuria, pelvic pain, urgency, vaginal bleeding, vaginal discharge and vaginal pain.  Musculoskeletal: Negative for arthralgias, gait problem and joint swelling.  Skin: Negative for rash.  Neurological: Positive for syncope. Negative for dizziness, speech difficulty, weakness, numbness and headaches.  Hematological: Negative for adenopathy.  Psychiatric/Behavioral: Positive for behavioral problems, confusion and hallucinations. Negative for agitation and dysphoric mood. The patient is not nervous/anxious.        Objective:   Physical Exam  Constitutional: She appears well-developed and well-nourished. No distress.  Alert and appropriate, blood pressure 150/80   Neurological:  MMSE 29/30.  Unable to name the county  Abnormal clock drawing test          Assessment & Plan:   Mild dementia.  Onset of hallucinations raises the possibility of LBD.  Follow-up neurology.  Options include increasing Aricept dose to 10 mg; probably should try another antipsychotic medication such as Risperdal if symptoms worsen.  Patient will be evaluated by neurology in 2 weeks.  Will defer to their clinical judgment.  Patient/husband  will report any worsening clinical symptoms. Hypertension.  Multi-drug-resistant.  Presently stable on current medicine.  No change in therapy.  Follow-up 3 months Continue low-salt diet and home blood pressure monitoring  KWIATKOWSKI,PETER Homero FellersFRANK

## 2016-04-01 NOTE — Progress Notes (Signed)
Pre visit review using our clinic review tool, if applicable. No additional management support is needed unless otherwise documented below in the visit note. 

## 2016-04-01 NOTE — Patient Instructions (Signed)
Limit your sodium (Salt) intake  Please check your blood pressure on a regular basis.  If it is consistently greater than 150/90, please make an office appointment.  Follow-up neurology as scheduled  Return in 3 months for follow-up

## 2016-04-14 DIAGNOSIS — G308 Other Alzheimer's disease: Secondary | ICD-10-CM | POA: Diagnosis not present

## 2016-04-14 DIAGNOSIS — R69 Illness, unspecified: Secondary | ICD-10-CM | POA: Diagnosis not present

## 2016-05-05 DIAGNOSIS — H2512 Age-related nuclear cataract, left eye: Secondary | ICD-10-CM | POA: Diagnosis not present

## 2016-05-05 DIAGNOSIS — Z961 Presence of intraocular lens: Secondary | ICD-10-CM | POA: Diagnosis not present

## 2016-05-05 DIAGNOSIS — I10 Essential (primary) hypertension: Secondary | ICD-10-CM | POA: Diagnosis not present

## 2016-05-05 DIAGNOSIS — H26491 Other secondary cataract, right eye: Secondary | ICD-10-CM | POA: Diagnosis not present

## 2016-05-07 ENCOUNTER — Other Ambulatory Visit: Payer: Self-pay | Admitting: Internal Medicine

## 2016-05-19 DIAGNOSIS — Z1231 Encounter for screening mammogram for malignant neoplasm of breast: Secondary | ICD-10-CM | POA: Diagnosis not present

## 2016-05-19 DIAGNOSIS — Z803 Family history of malignant neoplasm of breast: Secondary | ICD-10-CM | POA: Diagnosis not present

## 2016-05-19 LAB — HM MAMMOGRAPHY

## 2016-06-23 ENCOUNTER — Ambulatory Visit (INDEPENDENT_AMBULATORY_CARE_PROVIDER_SITE_OTHER): Payer: Medicare HMO | Admitting: Neurology

## 2016-06-23 ENCOUNTER — Encounter: Payer: Self-pay | Admitting: Neurology

## 2016-06-23 VITALS — BP 152/78 | HR 66 | Ht 62.0 in | Wt 174.2 lb

## 2016-06-23 DIAGNOSIS — R55 Syncope and collapse: Secondary | ICD-10-CM

## 2016-06-23 DIAGNOSIS — F0390 Unspecified dementia without behavioral disturbance: Secondary | ICD-10-CM

## 2016-06-23 DIAGNOSIS — F03A0 Unspecified dementia, mild, without behavioral disturbance, psychotic disturbance, mood disturbance, and anxiety: Secondary | ICD-10-CM

## 2016-06-23 DIAGNOSIS — R69 Illness, unspecified: Secondary | ICD-10-CM | POA: Diagnosis not present

## 2016-06-23 NOTE — Patient Instructions (Signed)
1. Continue all your medications. Check to see if the Donepezil is 10mg  daily. 2. Physical exercise and brain stimulation exercises (crossword puzzles, word search, reading) are important for brain health 3. Follow-up in 6 months, call for any changes

## 2016-06-23 NOTE — Progress Notes (Signed)
NEUROLOGY FOLLOW UP OFFICE NOTE  Evelyn Dutyrma Marriott [id number]  HISTORY OF PRESENT ILLNESS: I had the pleasure of seeing Evelyn Irwin in follow-up in the neurology clinic on 06/23/2016. The patient was last seen 4 months ago and is again accompanied by her husband who helps supplement the history today. Since her last visit, she has been back to Dr. Theodis AguasMhoon at [facility] last November 2017. Donepezil was increased to 10mg  daily. She did not tolerate Depakote for mood and possible seizures, and was started on Risperdal. She is taking low dose 0.25mg  BID with no side effects. Her husband reports the paranoia and hallucinations have stopped with Risperdal. She does feel drowsy during the daytime. No further syncopal episodes since July 2017. She feels her memory is okay, her husband continues to notice changes, she forgets where utensils are, putting dishes back in a different location. She states she remembers to take her medications. When he reports what has been going on, she states "he's just lying." She is independent with dressing and bathing. She does not drive.   HPI: This is an 83 yo RH woman with a history of hypertension presenting with recurrent syncopal episodes. The first episode occurred in 2013 when she woke up on the kitchen floor confused. [facility] where workup was unrevealing. Echo had shown basal septal hypertrophy and dynamic obstruction. She reports had another episode last August 2015 where she woke up on the grass, again feeling confused after. No tongue bite or urinary incontinence. She has chronic tinnitus and reports that the ringing in her ears may have gotten a little louder. She denied feeling dizzy or diaphoretic prior to the episodes. She has had an ILR since October 2015. Her head CT was unremarkable. Routine EEG showed occasional left temporal slowing, however her 24-hour EEG did not show similar changes and was within normal limits. She was unable to get an MRI due to  loop recorder. She has seen cardiology with normal EF. She had another event last 09/25/14 while she was lying on bed watching a ballgame. She has chronic tinnitus but recalls the tinnitus increased, then when she came to, she was lying on her back and felt very confused. She got up and went to the bathroom and felt she did not recognize anything. She walked through all the bedrooms looking for "that man" (her husband), then her memory started coming back. She saw "that man sitting on the chair," but did not speak to him and went back to the room. When he came to find her 20 minutes later, her symptoms had cleared but she did not feel "quite right." No tongue bite or incontinence. She denied any associated headache but thinks she felt this episode coming on a few days prior because she was having increased ringing in her ears for 2 days in a row (no associated confusion). Loop recorder did not indicate any cardiac abnormalities.  She and her husband recall a curious episode in 2010 or 2011 when she suddenly felt sleepy and fell across the bed. When she got up she did not know where and who she was, did not recognize their kitchen. She went back to sleep and woke up fine.   She feels that since the syncopal episode in August 2015, she does not remember things like she used to. They were on a cruise last November, and she forgot they were onboard a ship. She would move things around in their house and they cannot find them. She  has only missed 1 bill payment, denies missing medications. No family history of similar symptoms.   Diagnostic Data: Head CT which did not show any acute changes. Her routine EEG had shown non-specific slowing in the left temporal region, a 24-hour EEG did not show similar findings, and was within normal limits.   PAST MEDICAL HISTORY: Past Medical History:  Diagnosis Date  . ALLERGIC RHINITIS 11/11/2006  . ANXIETY 11/11/2006  . CONSTIPATION 02/23/2008  . GERD 10/06/2007  .  HYPERTENSION 11/11/2006  . HYPOKALEMIA, HX OF 02/09/2008  . Memory loss   . OSTEOARTHRITIS 11/11/2006    MEDICATIONS:  Outpatient Encounter Prescriptions as of 06/23/2016  Medication Sig  . amLODipine (NORVASC) 10 MG tablet TAKE 1 TABLET BY MOUTH EVERY DAY  . busPIRone (BUSPAR) 15 MG tablet TAKE 1/2 TABLET BY MOUTH EVERY DAY  . cloNIDine (CATAPRES) 0.1 MG tablet 1 tablet daily at bedtime  . donepezil (ARICEPT) 5 MG tablet Take 5 mg by mouth at bedtime.  . metoprolol tartrate (LOPRESSOR) 50 MG tablet Take 1 tablet (50 mg total) by mouth 2 (two) times daily.  . Misc Natural Products (ADRENAL PO) Take 1 tablet by mouth daily as needed (SINUSES).   . risperiDONE (RISPERDAL) 0.25 MG tablet TAKE 1 TABLET (0.25 MG TOTAL) BY MOUTH 2 (TWO) TIMES DAILY.  Marland Kitchen telmisartan (MICARDIS) 80 MG tablet TAKE 1 TABLET BY MOUTH EVERY DAY   No facility-administered encounter medications on file as of 06/23/2016.     ALLERGIES: Allergies  Allergen Reactions  . Depakote Er [Divalproex Sodium Er] Hives    FAMILY HISTORY: Family History  Problem Relation Age of Onset  . Stroke Mother   . Stroke Father   . Cancer Sister   . Stroke Brother   . Stroke Brother     SOCIAL HISTORY: Social History   Social History  . Marital status: Married    Spouse name: N/A  . Number of children: N/A  . Years of education: N/A   Occupational History  . Not on file.   Social History Main Topics  . Smoking status: Never Smoker  . Smokeless tobacco: Never Used  . Alcohol use No  . Drug use: No  . Sexual activity: Not on file   Other Topics Concern  . Not on file   Social History Narrative  . No narrative on file    REVIEW OF SYSTEMS: Constitutional: No fevers, chills, or sweats, no generalized fatigue, change in appetite Eyes: No visual changes, double vision, eye pain Ear, nose and throat: No hearing loss, ear pain, nasal congestion, sore throat Cardiovascular: No chest pain, palpitations Respiratory:  No  shortness of breath at rest or with exertion, wheezes GastrointestinaI: No nausea, vomiting, diarrhea, abdominal pain, fecal incontinence Genitourinary:  No dysuria, urinary retention or frequency Musculoskeletal:  No neck pain, back pain Integumentary: No rash, pruritus, skin lesions Neurological: as above Psychiatric: No depression, insomnia, anxiety Endocrine: No palpitations, fatigue, diaphoresis, mood swings, change in appetite, change in weight, increased thirst Hematologic/Lymphatic:  No anemia, purpura, petechiae. Allergic/Immunologic: no itchy/runny eyes, nasal congestion, recent allergic reactions, rashes  PHYSICAL EXAM: Vitals:   06/23/16 1414  BP: (!) 152/78  Pulse: 66   General: No acute distress Head:  Normocephalic/atraumatic Neck: supple, no paraspinal tenderness, full range of motion Heart:  Regular rate and rhythm Lungs:  Clear to auscultation bilaterally Back: No paraspinal tenderness Skin/Extremities: No rash, no edema Neurological Exam: alert and oriented to person, place, and month/year/day of week. No aphasia or dysarthria.  Fund of knowledge is appropriate.  Remote memory intact. 0/3 delayed recall. Attention and concentration are normal.    Able to name objects and repeat phrases. Cranial nerves: Pupils equal, round, reactive to light. Extraocular movements intact with no nystagmus. Visual fields full. Facial sensation intact. No facial asymmetry. Tongue, uvula, palate midline.  Motor: Bulk and tone normal, muscle strength 5/5 throughout with no pronator drift.  Sensation to light touch intact.  No extinction to double simultaneous stimulation.  Deep tendon reflexes 1+ throughout, toes downgoing.  Finger to nose testing intact.  Gait narrow-based and steady, slight difficulty with tandem walk but able (similar to prior).  Romberg negative.  IMPRESSION: This is an 83 yo RH woman with a history of hypertension and recurrent syncope of unclear etiology. No clear  epilepsy risk factors, neurological exam is normal. Her head CT was unremarkable. Routine EEG had shown occasional left temporal slowing, however her 24-hour EEG did not show similar changes, typical events were not captured. Cardiac workup unrevealing, with no arrhythmia noted on loop monitor during an event. She has been evaluated at [facility], concern for confusional episodes due to neurodegenerative disorder. No further episodes since July 2017. Neurocognitive testing at [facility] showing dementia per reports. MMSE in November at [facility] was 23/30. She is now on Donepezil 10mg  daily. She was also having hallucinations and paranoia, which stopped with Risperdal, continue 0.25mg  BID. She has minimal insight into her condition, continue to monitor medications, she was advised to use a pillbox. We discussed the importance of control of vascular risk factors, physical exercise, and brain stimulation exercises for brain health. She does not drive, we discussed [place] driving laws regarding episodes of loss of consciousness, as well as driving restrictions with dementia. She should not drive. She will follow-up in 6 months and knows to call for any changes.   Thank you for allowing me to participate in her care.  Please do not hesitate to call for any questions or concerns.  The duration of this appointment visit was 25 minutes of face-to-face time with the patient.  Greater than 50% of this time was spent in counseling, explanation of diagnosis, planning of further management, and coordination of care.   Patrcia Dolly, M.D.   CC: Dr. Amador Cunas

## 2016-06-30 ENCOUNTER — Ambulatory Visit (INDEPENDENT_AMBULATORY_CARE_PROVIDER_SITE_OTHER): Payer: Medicare HMO | Admitting: Internal Medicine

## 2016-06-30 ENCOUNTER — Encounter: Payer: Self-pay | Admitting: Internal Medicine

## 2016-06-30 VITALS — BP 142/70 | HR 64 | Temp 98.0°F | Ht 62.0 in | Wt 172.4 lb

## 2016-06-30 DIAGNOSIS — R55 Syncope and collapse: Secondary | ICD-10-CM | POA: Diagnosis not present

## 2016-06-30 DIAGNOSIS — I10 Essential (primary) hypertension: Secondary | ICD-10-CM | POA: Diagnosis not present

## 2016-06-30 DIAGNOSIS — F0390 Unspecified dementia without behavioral disturbance: Secondary | ICD-10-CM | POA: Diagnosis not present

## 2016-06-30 DIAGNOSIS — R69 Illness, unspecified: Secondary | ICD-10-CM | POA: Diagnosis not present

## 2016-06-30 DIAGNOSIS — F03A0 Unspecified dementia, mild, without behavioral disturbance, psychotic disturbance, mood disturbance, and anxiety: Secondary | ICD-10-CM

## 2016-06-30 MED ORDER — DONEPEZIL HCL 10 MG PO TABS: 10.0000 mg | ORAL_TABLET | Freq: Every day | ORAL | 5 refills | Status: DC

## 2016-06-30 NOTE — Progress Notes (Signed)
Subjective:    Patient ID: Evelyn Irwin, female    DOB: 1933/12/23, 83 y.o.   MRN: [id number]  HPI  83 year old patient who is seen today in follow-up.  She has a history of essential hypertension. She has a history also recurrent syncope, but no episodes since July of last year. She has been followed locally by neurology and also at [facility].  She has mild dementia associated with hallucinations, but no parkinsonian features.  Remains on Aricept 10 mg daily Doing well today. Recent neurology follow-up  Past Medical History:  Diagnosis Date  . ALLERGIC RHINITIS 11/11/2006  . ANXIETY 11/11/2006  . CONSTIPATION 02/23/2008  . GERD 10/06/2007  . HYPERTENSION 11/11/2006  . HYPOKALEMIA, HX OF 02/09/2008  . Memory loss   . OSTEOARTHRITIS 11/11/2006     Social History   Social History  . Marital status: Married    Spouse name: N/A  . Number of children: N/A  . Years of education: N/A   Occupational History  . Not on file.   Social History Main Topics  . Smoking status: Never Smoker  . Smokeless tobacco: Never Used  . Alcohol use No  . Drug use: No  . Sexual activity: Not on file   Other Topics Concern  . Not on file   Social History Narrative  . No narrative on file    Past Surgical History:  Procedure Laterality Date  . EP IMPLANTABLE DEVICE N/A 03/21/2015   Procedure: Loop Recorder Removal;  Surgeon: Hillis Range, MD;  Location[facility] IN[facility];  Service: Cardiovascular;  Laterality: N/A;  . LOOP RECORDER IMPLANT N/A 03/30/2014   MDT LINQ implanted by Dr Evelyn Irwin for syncope  . TOTAL KNEE ARTHROPLASTY     Right    Family History  Problem Relation Age of Onset  . Stroke Mother   . Stroke Father   . Cancer Sister   . Stroke Brother   . Stroke Brother     Allergies  Allergen Reactions  . Depakote Er [Divalproex Sodium Er] Hives    Current Outpatient Prescriptions on File Prior to Visit  Medication Sig Dispense Refill  . amLODipine (NORVASC) 10 MG  tablet TAKE 1 TABLET BY MOUTH EVERY DAY 90 tablet 1  . busPIRone (BUSPAR) 15 MG tablet TAKE 1/2 TABLET BY MOUTH EVERY DAY 90 tablet 1  . cloNIDine (CATAPRES) 0.1 MG tablet 1 tablet daily at bedtime 90 tablet 3  . donepezil (ARICEPT) 5 MG tablet Take 5 mg by mouth at bedtime.    . metoprolol tartrate (LOPRESSOR) 50 MG tablet Take 1 tablet (50 mg total) by mouth 2 (two) times daily. 180 tablet 2  . Misc Natural Products (ADRENAL PO) Take 1 tablet by mouth daily as needed (SINUSES).     . risperiDONE (RISPERDAL) 0.25 MG tablet TAKE 1 TABLET (0.25 MG TOTAL) BY MOUTH 2 (TWO) TIMES DAILY.  5  . telmisartan (MICARDIS) 80 MG tablet TAKE 1 TABLET BY MOUTH EVERY DAY 90 tablet 1   No current facility-administered medications on file prior to visit.     BP (!) 142/70 (BP Location: Left Arm, Patient Position: Sitting, Cuff Size: Normal)   Pulse 64   Temp 98 F (36.7 C) (Oral)   Ht 5\' 2"  (1.575 m)   Wt 172 lb 6.4 oz (78.2 kg)   SpO2 98%   BMI 31.53 kg/m     Review of Systems  Constitutional: Negative.   HENT: Negative for congestion, dental problem, hearing loss, rhinorrhea, sinus  pressure, sore throat and tinnitus.   Eyes: Negative for pain, discharge and visual disturbance.  Respiratory: Negative for cough and shortness of breath.   Cardiovascular: Negative for chest pain, palpitations and leg swelling.  Gastrointestinal: Negative for abdominal distention, abdominal pain, blood in stool, constipation, diarrhea, nausea and vomiting.  Genitourinary: Negative for difficulty urinating, dysuria, flank pain, frequency, hematuria, pelvic pain, urgency, vaginal bleeding, vaginal discharge and vaginal pain.  Musculoskeletal: Negative for arthralgias, gait problem and joint swelling.  Skin: Negative for rash.  Neurological: Positive for syncope. Negative for dizziness, speech difficulty, weakness, numbness and headaches.  Hematological: Negative for adenopathy.  Psychiatric/Behavioral: Positive for  confusion. Negative for agitation, behavioral problems and dysphoric mood. The patient is not nervous/anxious.        Objective:   Physical Exam  Constitutional: She is oriented to person, place, and time. She appears well-developed and well-nourished.  Overweight Blood pressure 120/72  HENT:  Head: Normocephalic.  Right Ear: External ear normal.  Left Ear: External ear normal.  Mouth/Throat: Oropharynx is clear and moist.  Eyes: Conjunctivae and EOM are normal. Pupils are equal, round, and reactive to light.  Neck: Normal Irwin of motion. Neck supple. No thyromegaly present.  Cardiovascular: Normal rate, regular rhythm, normal heart sounds and intact distal pulses.   Pulmonary/Chest: Effort normal and breath sounds normal.  Abdominal: Soft. Bowel sounds are normal. She exhibits no mass. There is no tenderness.  Musculoskeletal: Normal Irwin of motion.  Lymphadenopathy:    She has no cervical adenopathy.  Neurological: She is alert and oriented to person, place, and time.  Skin: Skin is warm and dry. No rash noted.  Psychiatric: She has a normal mood and affect. Her behavior is normal.          Assessment & Plan:   Essential hypertension.  Well-controlled History recurrent syncope, stable Mild dementia.  Continue Aricept 10  Follow-up neurology Follow-up here 6 months or as needed  Evelyn Irwin

## 2016-06-30 NOTE — Progress Notes (Signed)
Pre visit review using our clinic review tool, if applicable. No additional management support is needed unless otherwise documented below in the visit note. 

## 2016-06-30 NOTE — Patient Instructions (Signed)
Limit your sodium (Salt) intake  Please check your blood pressure on a regular basis.  If it is consistently greater than 150/90, please make an office appointment.    It is important that you exercise regularly, at least 20 minutes 3 to 4 times per week.  If you develop chest pain or shortness of breath seek  medical attention.  Return in 6 months for follow-up  

## 2016-07-29 ENCOUNTER — Other Ambulatory Visit: Payer: Self-pay | Admitting: Internal Medicine

## 2016-08-19 ENCOUNTER — Emergency Department (HOSPITAL_COMMUNITY)
Admission: EM | Admit: 2016-08-19 | Discharge: 2016-08-19 | Disposition: A | Discharge status: Home / Self Care | Payer: Medicare HMO | Attending: Emergency Medicine | Admitting: Emergency Medicine

## 2016-08-19 ENCOUNTER — Encounter (HOSPITAL_COMMUNITY): Payer: Self-pay

## 2016-08-19 DIAGNOSIS — R55 Syncope and collapse: Secondary | ICD-10-CM | POA: Insufficient documentation

## 2016-08-19 DIAGNOSIS — R42 Dizziness and giddiness: Secondary | ICD-10-CM | POA: Diagnosis present

## 2016-08-19 DIAGNOSIS — Z79899 Other long term (current) drug therapy: Secondary | ICD-10-CM | POA: Insufficient documentation

## 2016-08-19 DIAGNOSIS — N179 Acute kidney failure, unspecified: Secondary | ICD-10-CM | POA: Insufficient documentation

## 2016-08-19 DIAGNOSIS — I10 Essential (primary) hypertension: Secondary | ICD-10-CM | POA: Diagnosis not present

## 2016-08-19 DIAGNOSIS — Z96651 Presence of right artificial knee joint: Secondary | ICD-10-CM | POA: Insufficient documentation

## 2016-08-19 LAB — URINALYSIS, ROUTINE W REFLEX MICROSCOPIC
Bilirubin Urine: NEGATIVE
Glucose, UA: NEGATIVE mg/dL
Hgb urine dipstick: NEGATIVE
Ketones, ur: NEGATIVE mg/dL
LEUKOCYTES UA: NEGATIVE
NITRITE: NEGATIVE
PH: 5 (ref 5.0–8.0)
Protein, ur: NEGATIVE mg/dL
SPECIFIC GRAVITY, URINE: 1.016 (ref 1.005–1.030)

## 2016-08-19 LAB — BASIC METABOLIC PANEL
ANION GAP: 11 (ref 5–15)
BUN: 15 mg/dL (ref 6–20)
CO2: 25 mmol/L (ref 22–32)
Calcium: 9.4 mg/dL (ref 8.9–10.3)
Chloride: 104 mmol/L (ref 101–111)
Creatinine, Ser: 1.35 mg/dL — ABNORMAL HIGH (ref 0.44–1.00)
GFR, EST AFRICAN AMERICAN: 42 mL/min — AB (ref >60)
GFR, EST NON AFRICAN AMERICAN: 36 mL/min — AB (ref >60)
GLUCOSE: 104 mg/dL — AB (ref 65–99)
Potassium: 3.5 mmol/L (ref 3.5–5.1)
SODIUM: 140 mmol/L (ref 135–145)

## 2016-08-19 LAB — CBC
HEMATOCRIT: 38.1 % (ref 36.0–46.0)
HEMOGLOBIN: 12.5 g/dL (ref 12.0–15.0)
MCH: 30.4 pg (ref 26.0–34.0)
MCHC: 32.8 g/dL (ref 30.0–36.0)
MCV: 92.7 fL (ref 78.0–100.0)
Platelets: 261 10*3/uL (ref 150–400)
RBC: 4.11 MIL/uL (ref 3.87–5.11)
RDW: 14.8 % (ref 11.5–15.5)
WBC: 8.4 10*3/uL (ref 4.0–10.5)

## 2016-08-19 MED ORDER — SODIUM CHLORIDE 0.9 % IV BOLUS (SEPSIS)
1000.0000 mL | Freq: Once | INTRAVENOUS | Status: AC
  Administered 2016-08-19: 1000 mL via INTRAVENOUS

## 2016-08-19 NOTE — ED Triage Notes (Addendum)
Patient had recurrent dizziness this am that lasted just a few minutes. Family member reports patient was unsteady and almost fell. On arrival alert and oriented, no complaints. States that she has been worked up many times for same and no diagnosis. NAD. No arm drift, speech clear, no facial droop.

## 2016-08-19 NOTE — ED Provider Notes (Signed)
[facility] Provider Note   CSN: [id number] Arrival date & time: 08/19/16  1326   By signing my name below, I, [name], attest that this documentation has been prepared under the direction and in the presence of Evelyn Memos, MD . Electronically Signed: [name], Scribe. 08/19/2016. 6:06 PM.   History   Chief Complaint Chief Complaint  Patient presents with  . dizziness/ resolved    The history is provided by the patient. No language interpreter was used.     HPI Comments:  Evelyn Irwin is a 83 y.o. female who presents to the Emergency Department for an episode of dizziness/lightheadedness today. Family states pt appeared off and began to slid from the stool she was seated on and he helped her to the ground. This episode lasted ~ 1 hour and pt was too weak to get up off of the floor. Family also also notes associated diaphoresis.  No LOC. Her dizziness has resolved at this time. Family is concerned she may have mixed up her medications as he does not watch her take them. Family also  reports h/o similar episode ~ 1 year ago, unknown cause. He denies seizure like activity,. Pt denies CP, SOB and palpitations.   Past Medical History:  Diagnosis Date  . ALLERGIC RHINITIS 11/11/2006  . ANXIETY 11/11/2006  . CONSTIPATION 02/23/2008  . GERD 10/06/2007  . HYPERTENSION 11/11/2006  . HYPOKALEMIA, HX OF 02/09/2008  . Memory loss   . OSTEOARTHRITIS 11/11/2006    Patient Active Problem List   Diagnosis Date Noted  . Mild dementia 06/15/2015  . Mild cognitive impairment 01/12/2015  . Transient alteration of awareness 10/06/2014  . Faintness 10/06/2014  . Acute hypokalemia 01/23/2012  . Syncope and collapse 01/23/2012  . Palpitations 01/23/2012  . CONSTIPATION 02/23/2008  . History of hypokalemia 02/09/2008  . GERD 10/06/2007  . ANXIETY 11/11/2006  . Essential hypertension 11/11/2006  . ALLERGIC RHINITIS 11/11/2006  . Osteoarthritis 11/11/2006    Past Surgical  History:  Procedure Laterality Date  . EP IMPLANTABLE DEVICE N/A 03/21/2015   Procedure: Loop Recorder Removal;  Surgeon: Hillis Range, MD;  Location: [facility];  Service: Cardiovascular;  Laterality: N/A;  . LOOP RECORDER IMPLANT N/A 03/30/2014   MDT LINQ implanted by Dr Johney Frame for syncope  . TOTAL KNEE ARTHROPLASTY     Right    OB History    No data available       Home Medications    Prior to Admission medications   Medication Sig Start Date End Date Taking? Authorizing Provider  amLODipine (NORVASC) 10 MG tablet TAKE 1 TABLET BY MOUTH EVERY DAY 07/29/16   Gordy Savers, MD  busPIRone (BUSPAR) 15 MG tablet TAKE 1/2 TABLET BY MOUTH EVERY DAY 05/08/16   Gordy Savers, MD  cloNIDine (CATAPRES) 0.1 MG tablet 1 tablet daily at bedtime 02/12/16   Gordy Savers, MD  donepezil (ARICEPT) 10 MG tablet Take 1 tablet (10 mg total) by mouth at bedtime. 06/30/16   Gordy Savers, MD  metoprolol tartrate (LOPRESSOR) 50 MG tablet Take 1 tablet (50 mg total) by mouth 2 (two) times daily. 02/12/16   Gordy Savers, MD  Misc Natural Products (ADRENAL PO) Take 1 tablet by mouth daily as needed (SINUSES).     Historical Provider, MD  risperiDONE (RISPERDAL) 0.25 MG tablet TAKE 1 TABLET (0.25 MG TOTAL) BY MOUTH 2 (TWO) TIMES DAILY. 06/03/16   Historical Provider, MD  telmisartan (MICARDIS) 80 MG tablet TAKE 1  TABLET BY MOUTH EVERY DAY 07/29/16   Gordy Savers, MD    Family History Family History  Problem Relation Age of Onset  . Stroke Mother   . Stroke Father   . Cancer Sister   . Stroke Brother   . Stroke Brother     Social History Social History  Substance Use Topics  . Smoking status: Never Smoker  . Smokeless tobacco: Never Used  . Alcohol use No     Allergies   Depakote er [divalproex sodium er]   Review of Systems Review of Systems  Constitutional: Positive for diaphoresis.  Respiratory: Negative for shortness of breath.     Cardiovascular: Negative for chest pain and palpitations.  Neurological: Positive for dizziness and light-headedness. Negative for seizures and syncope.       Resolved   10 systems reviewed and all are negative for acute change except as noted in the HPI.  Physical Exam Updated Vital Signs BP (!) 178/75 (BP Location: Right Arm)   Pulse 68   Temp 97.9 F (36.6 C) (Oral)   Resp 13   SpO2 98%   Physical Exam  Constitutional: She is oriented to person, place, and time. She appears well-developed and well-nourished. No distress.  HENT:  Head: Normocephalic and atraumatic.  Eyes: Conjunctivae are normal.  Cardiovascular: Normal rate, regular rhythm and normal heart sounds.   Pulmonary/Chest: Effort normal and breath sounds normal. No respiratory distress.  Abdominal: Soft. She exhibits no distension. There is no tenderness.  Neurological: She is alert and oriented to person, place, and time. No cranial nerve deficit.  Face is symmetric, EOM's intact, pupils equal and reactive, vision intact, tongue and uvula midline without deviation Upper and Lower extremity motor 5/5, intact pain perception in distal extremities, 2+ reflexes in biceps, brachialis tendons. Difficult to palpate patellar reflexes bilaterally  Walks without assistance or evident ataxia.   Skin: Skin is warm and dry.  Psychiatric: She has a normal mood and affect.  Nursing note and vitals reviewed.    ED Treatments / Results  DIAGNOSTIC STUDIES:  Oxygen Saturation is 98% on RA, normal by my interpretation.    COORDINATION OF CARE:  6:06 PM Discussed treatment plan with pt and family at bedside and they agreed to plan.  Labs (all labs ordered are listed, but only abnormal results are displayed) Labs Reviewed  BASIC METABOLIC PANEL - Abnormal; Notable for the following:       Result Value   Glucose, Bld 104 (*)    Creatinine, Ser 1.35 (*)    GFR calc non Af Amer 36 (*)    GFR calc Af Amer 42 (*)    All other  components within normal limits  URINALYSIS, ROUTINE W REFLEX MICROSCOPIC - Abnormal; Notable for the following:    Color, Urine AMBER (*)    APPearance HAZY (*)    All other components within normal limits  CBC    EKG  EKG Interpretation  Date/Time:  Tuesday August 19 2016 13:31:41 EDT Ventricular Rate:  58 PR Interval:  148 QRS Duration: 84 QT Interval:  444 QTC Calculation: 435 R Axis:   77 Text Interpretation:  Sinus bradycardia Possible Left atrial enlargement Borderline ECG No significant change since last tracing Confirmed by [facility] MD, Barbara Cower [phone]) on 08/19/2016 6:06:05 PM       Radiology No results found.  Procedures Procedures (including critical care time)  Medications Ordered in ED Medications - No data to display   Initial Impression / Assessment  and Plan / ED Course  I have reviewed the triage vital signs and the nursing notes.  Pertinent labs & imaging results that were available during my care of the patient were reviewed by me and considered in my medical decision making (see chart for details).     Dizziness and near syncope, possibly med related per husband however here had an AKI. Able to ambulate normally. Neuro exam normal aside from disorientation. Has had multiple workups in past reportedly for the same. Plan for close pcp/neuro follow up. Care management consulted to get RN in home to help with med management.    Final Clinical Impressions(s) / ED Diagnoses   Final diagnoses:  None    New Prescriptions New Prescriptions   No medications on file   I personally performed the services described in this documentation, which was scribed in my presence. The recorded information has been reviewed and is accurate.    Evelyn MemosJason Nikiesha Milford, MD 08/19/16 2025

## 2016-08-20 ENCOUNTER — Telehealth: Payer: Self-pay | Admitting: *Deleted

## 2016-08-20 DIAGNOSIS — R404 Transient alteration of awareness: Secondary | ICD-10-CM | POA: Diagnosis not present

## 2016-08-20 DIAGNOSIS — R42 Dizziness and giddiness: Secondary | ICD-10-CM | POA: Diagnosis not present

## 2016-08-20 NOTE — Telephone Encounter (Signed)
Markeshia Giebel J. Lucretia RoersWood, RN, BSN, [employer] [phone] Spoke with pt husband Fayrene Fearing(James) via telephone [facility]. Read list of home health agencies to choose from.  [facility]. [facility]rew [facility]. Patient [facility] aware [facility] in [facility] in 24-48 hours.  No DME needs identified at this time.

## 2016-08-22 DIAGNOSIS — Z9181 History of falling: Secondary | ICD-10-CM | POA: Diagnosis not present

## 2016-08-22 DIAGNOSIS — R55 Syncope and collapse: Secondary | ICD-10-CM | POA: Diagnosis not present

## 2016-08-22 DIAGNOSIS — I10 Essential (primary) hypertension: Secondary | ICD-10-CM | POA: Diagnosis not present

## 2016-08-22 DIAGNOSIS — R69 Illness, unspecified: Secondary | ICD-10-CM | POA: Diagnosis not present

## 2016-08-26 ENCOUNTER — Telehealth: Payer: Self-pay | Admitting: Internal Medicine

## 2016-08-26 NOTE — Telephone Encounter (Signed)
Approval for a plan of care 2 x for 2 weeks for medication management and teaching spouse to fill pill box.

## 2016-08-27 NOTE — Telephone Encounter (Signed)
okay

## 2016-08-27 NOTE — Telephone Encounter (Signed)
Called Marchelle Folksmanda, left message on voicemail to call office.

## 2016-08-27 NOTE — Telephone Encounter (Signed)
See message below, please advise.

## 2016-08-28 NOTE — Telephone Encounter (Signed)
[facility]  Per Dr Kirtland BouchardK for a plan of care 2 x for 2 weeks for medication management and teaching spouse to fill pill box.

## 2016-09-02 DIAGNOSIS — R55 Syncope and collapse: Secondary | ICD-10-CM | POA: Diagnosis not present

## 2016-09-02 DIAGNOSIS — Z9181 History of falling: Secondary | ICD-10-CM | POA: Diagnosis not present

## 2016-09-02 DIAGNOSIS — R69 Illness, unspecified: Secondary | ICD-10-CM | POA: Diagnosis not present

## 2016-09-02 DIAGNOSIS — I10 Essential (primary) hypertension: Secondary | ICD-10-CM | POA: Diagnosis not present

## 2016-09-04 DIAGNOSIS — R55 Syncope and collapse: Secondary | ICD-10-CM | POA: Diagnosis not present

## 2016-09-04 DIAGNOSIS — I10 Essential (primary) hypertension: Secondary | ICD-10-CM | POA: Diagnosis not present

## 2016-09-04 DIAGNOSIS — Z9181 History of falling: Secondary | ICD-10-CM | POA: Diagnosis not present

## 2016-09-04 DIAGNOSIS — R69 Illness, unspecified: Secondary | ICD-10-CM | POA: Diagnosis not present

## 2016-09-09 ENCOUNTER — Encounter: Payer: Self-pay | Admitting: Internal Medicine

## 2016-09-09 ENCOUNTER — Ambulatory Visit (INDEPENDENT_AMBULATORY_CARE_PROVIDER_SITE_OTHER): Payer: Medicare HMO | Admitting: Internal Medicine

## 2016-09-09 VITALS — BP 122/68 | HR 55 | Temp 98.5°F | Ht 62.0 in | Wt 169.8 lb

## 2016-09-09 DIAGNOSIS — I10 Essential (primary) hypertension: Secondary | ICD-10-CM

## 2016-09-09 DIAGNOSIS — Z8639 Personal history of other endocrine, nutritional and metabolic disease: Secondary | ICD-10-CM | POA: Diagnosis not present

## 2016-09-09 DIAGNOSIS — R69 Illness, unspecified: Secondary | ICD-10-CM | POA: Diagnosis not present

## 2016-09-09 DIAGNOSIS — R55 Syncope and collapse: Secondary | ICD-10-CM

## 2016-09-09 DIAGNOSIS — F0390 Unspecified dementia without behavioral disturbance: Secondary | ICD-10-CM

## 2016-09-09 DIAGNOSIS — F03A0 Unspecified dementia, mild, without behavioral disturbance, psychotic disturbance, mood disturbance, and anxiety: Secondary | ICD-10-CM

## 2016-09-09 MED ORDER — METOPROLOL TARTRATE 50 MG PO TABS: 25.0000 mg | ORAL_TABLET | Freq: Two times a day (BID) | ORAL | 2 refills | Status: DC

## 2016-09-09 NOTE — Progress Notes (Signed)
Pre visit review using our clinic review tool, if applicable. No additional management support is needed unless otherwise documented below in the visit note. 

## 2016-09-09 NOTE — Patient Instructions (Signed)
Decrease metoprolol to 25 mg  (1/2 of 50 mg tablet) twice daily  Limit your sodium (Salt) intake  Please check your blood pressure on a regular basis.  If it is consistently greater than 150/90, please make an office appointment.  Return in 3 months for follow-up

## 2016-09-09 NOTE — Progress Notes (Signed)
Subjective:    Patient ID: Evelyn Irwin, female    DOB: 18-Sep-1933, 83 y.o.   MRN: [id number]  HPI 83 year old patient who is seen today following a recent brief hospital admission. She was admitted following a syncopal episode. Hospital evaluation revealed hypokalemia and hydrochlorothiazide has been discontinued and metoprolol 12 point 5 mg twice a day substituted The patient was monitored with telemetry and no arrhythmias noted.  Carotid duplex was unremarkable.  An echocardiogram revealed hyperdynamic ejection fraction.  Head CT was negative. She has a history of recurrent syncope and has had ILR implantation with negative results in the past. EKG revealed a mild sinus bradycardia. Her husband witnessed this last episode and he describes a near syncopal episode.  He states that she was sitting on a stool and started feel weak and she slid off a stool and was helped to the floor.  She felt weak for a few minutes but there was never total loss of consciousness  The husband states that she has been taking metoprolol 50 mg twice daily  Past Medical History:  Diagnosis Date  . ALLERGIC RHINITIS 11/11/2006  . ANXIETY 11/11/2006  . CONSTIPATION 02/23/2008  . GERD 10/06/2007  . HYPERTENSION 11/11/2006  . HYPOKALEMIA, HX OF 02/09/2008  . Memory loss   . OSTEOARTHRITIS 11/11/2006     Social History   Social History  . Marital status: Married    Spouse name: N/A  . Number of children: N/A  . Years of education: N/A   Occupational History  . Not on file.   Social History Main Topics  . Smoking status: Never Smoker  . Smokeless tobacco: Never Used  . Alcohol use No  . Drug use: No  . Sexual activity: Not on file   Other Topics Concern  . Not on file   Social History Narrative  . No narrative on file    Past Surgical History:  Procedure Laterality Date  . EP IMPLANTABLE DEVICE N/A 03/21/2015   Procedure: Loop Recorder Removal;  Surgeon: Hillis Range, MD;  Location: [facility] IN[facility] LAB;  Service: Cardiovascular;  Laterality: N/A;  . LOOP RECORDER IMPLANT N/A 03/30/2014   MDT LINQ implanted by Dr Johney Frame for syncope  . TOTAL KNEE ARTHROPLASTY     Right    Family History  Problem Relation Age of Onset  . Stroke Mother   . Stroke Father   . Cancer Sister   . Stroke Brother   . Stroke Brother     Allergies  Allergen Reactions  . Depakote Er [Divalproex Sodium Er] Hives    Current Outpatient Prescriptions on File Prior to Visit  Medication Sig Dispense Refill  . amLODipine (NORVASC) 10 MG tablet TAKE 1 TABLET BY MOUTH EVERY DAY 90 tablet 1  . busPIRone (BUSPAR) 15 MG tablet TAKE 1/2 TABLET BY MOUTH EVERY DAY 90 tablet 1  . cloNIDine (CATAPRES) 0.1 MG tablet 1 tablet daily at bedtime 90 tablet 3  . donepezil (ARICEPT) 10 MG tablet Take 1 tablet (10 mg total) by mouth at bedtime. 90 tablet 5  . metoprolol tartrate (LOPRESSOR) 50 MG tablet Take 1 tablet (50 mg total) by mouth 2 (two) times daily. 180 tablet 2  . Misc Natural Products (ADRENAL PO) Take 1 tablet by mouth daily as needed (SINUSES).     . risperiDONE (RISPERDAL) 0.25 MG tablet TAKE 1 TABLET (0.25 MG TOTAL) BY MOUTH 2 (TWO) TIMES DAILY.  5  . telmisartan (MICARDIS) 80 MG tablet TAKE 1 TABLET BY  MOUTH EVERY DAY 90 tablet 1   No current facility-administered medications on file prior to visit.     BP 122/68 (BP Location: Left Arm, Patient Position: Sitting, Cuff Size: Normal)   Pulse (!) 55   Temp 98.5 F (36.9 C) (Oral)   Ht  (1.575 m)   Wt 169 lb 12.8 oz (77 kg)   SpO2 98%   BMI 31.06 kg/m      Review of Systems  HENT: Negative for congestion, dental problem, hearing loss, rhinorrhea, sinus pressure, sore throat and tinnitus.   Eyes: Negative for pain, discharge and visual disturbance.  Respiratory: Negative for cough and shortness of breath.   Cardiovascular: Negative for chest pain, palpitations and leg swelling.  Gastrointestinal: Negative for abdominal distention, abdominal  pain, blood in stool, constipation, diarrhea, nausea and vomiting.  Genitourinary: Negative for difficulty urinating, dysuria, flank pain, frequency, hematuria, pelvic pain, urgency, vaginal bleeding, vaginal discharge and vaginal pain.  Musculoskeletal: Negative for arthralgias, gait problem and joint swelling.  Skin: Negative for rash.  Neurological: Positive for syncope and weakness. Negative for dizziness, speech difficulty, numbness and headaches.  Hematological: Negative for adenopathy.  Psychiatric/Behavioral: Positive for confusion. Negative for agitation, behavioral problems and dysphoric mood. The patient is not nervous/anxious.        Objective:   Physical Exam  Constitutional: She is oriented to person, place, and time. She appears well-developed and well-nourished.  Pulse 55 Blood pressure 140/70  HENT:  Head: Normocephalic.  Right Ear: External ear normal.  Left Ear: External ear normal.  Mouth/Throat: Oropharynx is clear and moist.  Eyes: Conjunctivae and EOM are normal. Pupils are equal, round, and reactive to light.  Neck: Normal range of motion. Neck supple. No thyromegaly present.  Cardiovascular: Normal rate, regular rhythm, normal heart sounds and intact distal pulses.   Pulmonary/Chest: Effort normal and breath sounds normal.  Abdominal: Soft. Bowel sounds are normal. She exhibits no mass. There is no tenderness.  Musculoskeletal: Normal range of motion.  Lymphadenopathy:    She has no cervical adenopathy.  Neurological: She is alert and oriented to person, place, and time.  Skin: Skin is warm and dry. No rash noted.  Psychiatric: She has a normal mood and affect. Her behavior is normal.          Assessment & Plan:   History of recurrent syncope.  We'll decrease metoprolol to 25 mg twice daily.  Otherwise medical regimen unchanged Essential hypertension Mild dementia History of volume depletion.  Diuretic therapy discontinued  Follow-up 3  months  Anna-Marie Coller Homero Fellers

## 2016-09-10 DIAGNOSIS — R69 Illness, unspecified: Secondary | ICD-10-CM | POA: Diagnosis not present

## 2016-09-10 DIAGNOSIS — R55 Syncope and collapse: Secondary | ICD-10-CM | POA: Diagnosis not present

## 2016-09-10 DIAGNOSIS — I10 Essential (primary) hypertension: Secondary | ICD-10-CM | POA: Diagnosis not present

## 2016-09-10 DIAGNOSIS — Z9181 History of falling: Secondary | ICD-10-CM | POA: Diagnosis not present

## 2016-09-12 DIAGNOSIS — R69 Illness, unspecified: Secondary | ICD-10-CM | POA: Diagnosis not present

## 2016-09-12 DIAGNOSIS — R55 Syncope and collapse: Secondary | ICD-10-CM | POA: Diagnosis not present

## 2016-09-12 DIAGNOSIS — I10 Essential (primary) hypertension: Secondary | ICD-10-CM | POA: Diagnosis not present

## 2016-09-12 DIAGNOSIS — Z9181 History of falling: Secondary | ICD-10-CM | POA: Diagnosis not present

## 2016-09-12 DIAGNOSIS — F0390 Unspecified dementia without behavioral disturbance: Secondary | ICD-10-CM

## 2016-09-18 DIAGNOSIS — Z9181 History of falling: Secondary | ICD-10-CM | POA: Diagnosis not present

## 2016-09-18 DIAGNOSIS — R55 Syncope and collapse: Secondary | ICD-10-CM | POA: Diagnosis not present

## 2016-09-18 DIAGNOSIS — I10 Essential (primary) hypertension: Secondary | ICD-10-CM | POA: Diagnosis not present

## 2016-09-18 DIAGNOSIS — R69 Illness, unspecified: Secondary | ICD-10-CM | POA: Diagnosis not present

## 2016-10-01 ENCOUNTER — Encounter: Payer: Self-pay | Admitting: Neurology

## 2016-10-01 ENCOUNTER — Ambulatory Visit (INDEPENDENT_AMBULATORY_CARE_PROVIDER_SITE_OTHER): Payer: Medicare HMO | Admitting: Neurology

## 2016-10-01 VITALS — BP 124/68 | HR 65 | Temp 98.3°F | Ht 60.0 in | Wt 168.0 lb

## 2016-10-01 DIAGNOSIS — R69 Illness, unspecified: Secondary | ICD-10-CM | POA: Diagnosis not present

## 2016-10-01 DIAGNOSIS — F03A0 Unspecified dementia, mild, without behavioral disturbance, psychotic disturbance, mood disturbance, and anxiety: Secondary | ICD-10-CM

## 2016-10-01 DIAGNOSIS — F0390 Unspecified dementia without behavioral disturbance: Secondary | ICD-10-CM

## 2016-10-01 DIAGNOSIS — R55 Syncope and collapse: Secondary | ICD-10-CM | POA: Diagnosis not present

## 2016-10-01 NOTE — Patient Instructions (Signed)
1. Continue Aricept  daily 2. Continue all your medications 3. Control of blood pressure, cholesterol, as well as physical exercise and brain stimulation exercises are important for brain health 4. Follow-up in 6 months, call for any changes

## 2016-10-01 NOTE — Progress Notes (Signed)
NEUROLOGY FOLLOW UP OFFICE NOTE  Renny Gunnarson [id number]  HISTORY OF PRESENT ILLNESS: I had the pleasure of seeing Kassidee Narciso in follow-up in the neurology clinic on 10/01/2016. The patient was last seen 3 months ago and is again accompanied by her husband who helps supplement the history today. Since her last visit, her husband reports an episode last 08/19/16 while sitting on the kitchen stool when he noticed something was wrong, she seemed to be slightly swaying her head side to side, he asked her what was wrong and she said she did not know, then she started sliding off the stool. She did not lose consciousness. She was brought to the ER where she was diagnosed with acute kidney injury and were told she was dehydrated. Dose of metoprolol was cut in half. She remembers the incident. No other syncopal episodes since July 2017. She continues to have memory issues, she is taking Aricept  daily. No further hallucinations since starting Risperdal. She does not think her memory is bad, her husband shakes his head. She repeats herself and misplaces things. She has put things in the pantry that should be in the fridge or put silverware some place else. She does not drive. Her husband now manages her medications. She is independent with dressing and bathing.  HPI: This is an 83 yo RH woman with a history of hypertension who presented with recurrent syncopal episodes. The first episode occurred in 2013 when she woke up on the kitchen floor confused. She was admitted to [facility] where workup was unrevealing. Echo had shown basal septal hypertrophy and dynamic obstruction. She reports had another episode last August 2015 where she woke up on the grass, again feeling confused after. No tongue bite or urinary incontinence. She has chronic tinnitus and reports that the ringing in her ears may have gotten a little louder. She denied feeling dizzy or diaphoretic prior to the episodes. She had an ILR in October 2015.  Her head CT was unremarkable. Routine EEG showed occasional left temporal slowing, however her 24-hour EEG did not show similar changes and was within normal limits. She was unable to get an MRI due to loop recorder. She has seen cardiology with normal EF. She had another event last 09/25/14 while she was lying on bed watching a ballgame. She has chronic tinnitus but recalls the tinnitus increased, then when she came to, she was lying on her back and felt very confused. She got up and went to the bathroom and felt she did not recognize anything. She walked through all the bedrooms looking for "that man" (her husband), then her memory started coming back. She saw "that man sitting on the chair," but did not speak to him and went back to the room. When he came to find her 20 minutes later, her symptoms had cleared but she did not feel "quite right." No tongue bite or incontinence. She denied any associated headache but thinks she felt this episode coming on a few days prior because she was having increased ringing in her ears for 2 days in a row (no associated confusion). Loop recorder did not indicate any cardiac abnormalities.  She and her husband recall a curious episode in 2010 or 2011 when she suddenly felt sleepy and fell across the bed. When she got up she did not know where and who she was, did not recognize their kitchen. She went back to sleep and woke up fine.   She feels that since the syncopal episode in  August 2015, she does not remember things like she used to. They were on a cruise last November, and she forgot they were onboard a ship. She would move things around in their house and they cannot find them. She has only missed 1 bill payment, denies missing medications. No family history of similar symptoms.   Diagnostic Data: Head CT which did not show any acute changes. Her routine EEG had shown non-specific slowing in the left temporal region, a 24-hour EEG did not show similar findings, and  was within normal limits.   PAST MEDICAL HISTORY: Past Medical History:  Diagnosis Date  . ALLERGIC RHINITIS 11/11/2006  . ANXIETY 11/11/2006  . CONSTIPATION 02/23/2008  . GERD 10/06/2007  . HYPERTENSION 11/11/2006  . HYPOKALEMIA, HX OF 02/09/2008  . Memory loss   . OSTEOARTHRITIS 11/11/2006    MEDICATIONS:  Outpatient Encounter Prescriptions as of 10/01/2016  Medication Sig  . amLODipine (NORVASC) 10 MG tablet TAKE 1 TABLET BY MOUTH EVERY DAY  . busPIRone (BUSPAR) 15 MG tablet TAKE 1/2 TABLET BY MOUTH EVERY DAY  . cloNIDine (CATAPRES) 0.1 MG tablet 1 tablet daily at bedtime  . donepezil (ARICEPT) 10 MG tablet Take 1 tablet (10 mg total) by mouth at bedtime.  . metoprolol (LOPRESSOR) 50 MG tablet Take 0.5 tablets (25 mg total) by mouth 2 (two) times daily.  . Misc Natural Products (ADRENAL PO) Take 1 tablet by mouth daily as needed (SINUSES).   . risperiDONE (RISPERDAL) 0.25 MG tablet TAKE 1 TABLET (0.25 MG TOTAL) BY MOUTH 2 (TWO) TIMES DAILY.  Marland Kitchen telmisartan (MICARDIS) 80 MG tablet TAKE 1 TABLET BY MOUTH EVERY DAY   No facility-administered encounter medications on file as of 10/01/2016.     ALLERGIES: Allergies  Allergen Reactions  . Depakote Er [Divalproex Sodium Er] Hives    FAMILY HISTORY: Family History  Problem Relation Age of Onset  . Stroke Mother   . Stroke Father   . Cancer Sister   . Stroke Brother   . Stroke Brother     SOCIAL HISTORY: Social History   Social History  . Marital status: Married    Spouse name: N/A  . Number of children: N/A  . Years of education: N/A   Occupational History  . Not on file.   Social History Main Topics  . Smoking status: Never Smoker  . Smokeless tobacco: Never Used  . Alcohol use No  . Drug use: No  . Sexual activity: Not on file   Other Topics Concern  . Not on file   Social History Narrative  . No narrative on file    REVIEW OF SYSTEMS: Constitutional: No fevers, chills, or sweats, no generalized fatigue,  change in appetite Eyes: No visual changes, double vision, eye pain Ear, nose and throat: No hearing loss, ear pain, nasal congestion, sore throat Cardiovascular: No chest pain, palpitations Respiratory:  No shortness of breath at rest or with exertion, wheezes GastrointestinaI: No nausea, vomiting, diarrhea, abdominal pain, fecal incontinence Genitourinary:  No dysuria, urinary retention or frequency Musculoskeletal:  No neck pain, back pain Integumentary: No rash, pruritus, skin lesions Neurological: as above Psychiatric: No depression, insomnia, anxiety Endocrine: No palpitations, fatigue, diaphoresis, mood swings, change in appetite, change in weight, increased thirst Hematologic/Lymphatic:  No anemia, purpura, petechiae. Allergic/Immunologic: no itchy/runny eyes, nasal congestion, recent allergic reactions, rashes  PHYSICAL EXAM: Vitals:   10/01/16 0951  BP: 124/68  Pulse: 65  Temp: 98.3 F (36.8 C)   General: No acute distress Head:  Normocephalic/atraumatic Neck: supple, no paraspinal tenderness, full range of motion Heart:  Regular rate and rhythm Lungs:  Clear to auscultation bilaterally Back: No paraspinal tenderness Skin/Extremities: No rash, no edema Neurological Exam: alert and oriented to person, place, and month/year/day of week. No aphasia or dysarthria. Fund of knowledge is appropriate.  Remote memory intact. 0/3 delayed recall. Attention and concentration are normal.    Able to name objects and repeat phrases.  MMSE - Mini Mental State Exam 10/01/2016 02/29/2016 06/15/2015  Orientation to time Orientation to Place Registration Attention/ Calculation Recall 0 0 0  Language- name 2 objects Language- repeat Language- follow 3 step command Language- read & follow direction Write a sentence Copy design Total score Cranial nerves: Pupils equal, round, reactive to light. Extraocular  movements intact with no nystagmus. Visual fields full. Facial sensation intact. No facial asymmetry. Tongue, uvula, palate midline.  Motor: Bulk and tone normal, muscle strength 5/5 throughout with no pronator drift.  Sensation to light touch intact.  No extinction to double simultaneous stimulation.  Deep tendon reflexes 1+ throughout, toes downgoing.  Finger to nose testing intact.  Gait narrow-based and steady, slight difficulty with tandem walk but able (similar to prior).  Romberg negative.  IMPRESSION: This is an 83 yo RH woman with a history of hypertension and recurrent syncope of unclear etiology. No clear epilepsy risk factors, neurological exam is normal. Her head CT was unremarkable. Routine EEG had shown occasional left temporal slowing, however her 24-hour EEG did not show similar changes, typical events were not captured. Cardiac workup unrevealing, with no arrhythmia noted on loop monitor during an event. She has been evaluated at [facility], concern for confusional episodes due to neurodegenerative disorder. No further episodes since July 2017. Neurocognitive testing at [facility] showing dementia per reports. MMSE in November 2017 at [facility] was 23/30, similar to today. Continue Donepezil  daily. Hallucinations and paranoia have stopped with Risperdal, continue 0.25mg  BID. We discussed potentially adding on Namenda on her next visit, she feels she is taking too much medication and does not want to add medication at this time. Her husband is now in charge of her medications. She does not drive. We again discussed the importance of control of vascular risk factors, physical exercise, and brain stimulation exercises for brain health. She will follow-up in 6 months and knows to call for any changes.   Thank you for allowing me to participate in her care.  Please do not hesitate to call for any questions or concerns.  The duration of this appointment visit was 25 minutes of face-to-face time with the  patient.  Greater than 50% of this time was spent in counseling, explanation of diagnosis, planning of further management, and coordination of care.   Patrcia Dolly, M.D.   CC: Dr. Amador Cunas

## 2016-10-14 DIAGNOSIS — M25561 Pain in right knee: Secondary | ICD-10-CM | POA: Diagnosis not present

## 2016-10-14 DIAGNOSIS — M25562 Pain in left knee: Secondary | ICD-10-CM | POA: Diagnosis not present

## 2016-11-04 DIAGNOSIS — Z96641 Presence of right artificial hip joint: Secondary | ICD-10-CM | POA: Diagnosis not present

## 2016-11-17 DIAGNOSIS — Z961 Presence of intraocular lens: Secondary | ICD-10-CM | POA: Diagnosis not present

## 2016-11-17 DIAGNOSIS — H2512 Age-related nuclear cataract, left eye: Secondary | ICD-10-CM | POA: Diagnosis not present

## 2016-11-24 ENCOUNTER — Other Ambulatory Visit: Payer: Self-pay | Admitting: Family Medicine

## 2016-11-24 MED ORDER — CLONIDINE HCL 0.1 MG PO TABS: ORAL_TABLET | ORAL | 0 refills | Status: DC

## 2016-11-24 NOTE — Telephone Encounter (Signed)
I received a fax from the pharmacy on Friday requesting a refill of clonidine.  Sent the fax back stating she was refilled on 02/12/16 for 1 year.  Pharmacy has sent a request back stating they never received the refills from 02/12/16.  Would like a 90 day supply.

## 2016-12-15 ENCOUNTER — Ambulatory Visit (INDEPENDENT_AMBULATORY_CARE_PROVIDER_SITE_OTHER): Payer: Medicare HMO | Admitting: Internal Medicine

## 2016-12-15 ENCOUNTER — Encounter: Payer: Self-pay | Admitting: Internal Medicine

## 2016-12-15 VITALS — BP 142/70 | HR 62 | Temp 98.5°F | Ht 60.0 in | Wt 164.2 lb

## 2016-12-15 DIAGNOSIS — I10 Essential (primary) hypertension: Secondary | ICD-10-CM | POA: Diagnosis not present

## 2016-12-15 DIAGNOSIS — R55 Syncope and collapse: Secondary | ICD-10-CM | POA: Diagnosis not present

## 2016-12-15 DIAGNOSIS — G3184 Mild cognitive impairment, so stated: Secondary | ICD-10-CM | POA: Diagnosis not present

## 2016-12-15 NOTE — Progress Notes (Signed)
Subjective:    Patient ID: Evelyn Irwin, female    DOB: 1934/01/07, 83 y.o.   MRN: [id number]  HPI 83 year old patient who is seen today for her six-month follow-up.  She has a history of essential hypertension. She is also followed by neurology with a history of syncope and also mild cognitive impairment. She is doing quite well.  There is been some modest involuntary weight loss. No concerns or complaints.  No recurrent syncope  Past Medical History:  Diagnosis Date  . ALLERGIC RHINITIS 11/11/2006  . ANXIETY 11/11/2006  . CONSTIPATION 02/23/2008  . GERD 10/06/2007  . HYPERTENSION 11/11/2006  . HYPOKALEMIA, HX OF 02/09/2008  . Memory loss   . OSTEOARTHRITIS 11/11/2006     Social History   Social History  . Marital status: Married    Spouse name: N/A  . Number of children: N/A  . Years of education: N/A   Occupational History  . Not on file.   Social History Main Topics  . Smoking status: Never Smoker  . Smokeless tobacco: Never Used  . Alcohol use No  . Drug use: No  . Sexual activity: Not on file   Other Topics Concern  . Not on file   Social History Narrative  . No narrative on file    Past Surgical History:  Procedure Laterality Date  . EP IMPLANTABLE DEVICE N/A 03/21/2015   Procedure: Loop Recorder Removal;  Surgeon: Hillis RangeJames Allred, MD;  Location: [facility];  Service: Cardiovascular;  Laterality: N/A;  . LOOP RECORDER IMPLANT N/A 03/30/2014   MDT LINQ implanted by Dr Johney FrameAllred for syncope  . TOTAL KNEE ARTHROPLASTY     Right    Family History  Problem Relation Age of Onset  . Stroke Mother   . Stroke Father   . Cancer Sister   . Stroke Brother   . Stroke Brother     Allergies  Allergen Reactions  . Depakote Er [Divalproex Sodium Er] Hives    Current Outpatient Prescriptions on File Prior to Visit  Medication Sig Dispense Refill  . amLODipine (NORVASC) 10 MG tablet TAKE 1 TABLET BY MOUTH EVERY DAY 90 tablet 1  . cloNIDine (CATAPRES) 0.1 MG  tablet 1 tablet daily at bedtime 90 tablet 0  . donepezil (ARICEPT) 10 MG tablet Take 1 tablet (10 mg total) by mouth at bedtime. 90 tablet 5  . metoprolol (LOPRESSOR) 50 MG tablet Take 0.5 tablets (25 mg total) by mouth 2 (two) times daily. 180 tablet 2  . Misc Natural Products (ADRENAL PO) Take 1 tablet by mouth daily as needed (SINUSES).     . risperiDONE (RISPERDAL) 0.25 MG tablet TAKE 1 TABLET (0.25 MG TOTAL) BY MOUTH 2 (TWO) TIMES DAILY.  5  . telmisartan (MICARDIS) 80 MG tablet TAKE 1 TABLET BY MOUTH EVERY DAY 90 tablet 1   No current facility-administered medications on file prior to visit.     BP (!) 142/70 (BP Location: Left Arm, Patient Position: Sitting, Cuff Size: Normal)   Pulse 62   Temp 98.5 F (36.9 C) (Oral)   Ht 5' (1.524 m)   Wt 164 lb 3.2 oz (74.5 kg)   SpO2 98%   BMI 32.07 kg/m      Review of Systems  Constitutional: Negative.   HENT: Negative for congestion, dental problem, hearing loss, rhinorrhea, sinus pressure, sore throat and tinnitus.   Eyes: Negative for pain, discharge and visual disturbance.  Respiratory: Negative for cough and shortness of breath.   Cardiovascular:  Negative for chest pain, palpitations and leg swelling.  Gastrointestinal: Negative for abdominal distention, abdominal pain, blood in stool, constipation, diarrhea, nausea and vomiting.  Genitourinary: Negative for difficulty urinating, dysuria, flank pain, frequency, hematuria, pelvic pain, urgency, vaginal bleeding, vaginal discharge and vaginal pain.  Musculoskeletal: Negative for arthralgias, gait problem and joint swelling.  Skin: Negative for rash.  Neurological: Negative for dizziness, syncope, speech difficulty, weakness, numbness and headaches.  Hematological: Negative for adenopathy.  Psychiatric/Behavioral: Negative for agitation, behavioral problems and dysphoric mood. The patient is not nervous/anxious.        Objective:   Physical Exam  Constitutional: She is oriented  to person, place, and time. She appears well-developed and well-nourished.  Blood pressure 140/72  HENT:  Head: Normocephalic.  Right Ear: External ear normal.  Left Ear: External ear normal.  Mouth/Throat: Oropharynx is clear and moist.  Eyes: Pupils are equal, round, and reactive to light. Conjunctivae and EOM are normal.  Neck: Normal range of motion. Neck supple. No thyromegaly present.  Cardiovascular: Normal rate, regular rhythm, normal heart sounds and intact distal pulses.   Pulmonary/Chest: Effort normal and breath sounds normal.  Abdominal: Soft. Bowel sounds are normal. She exhibits no mass. There is no tenderness.  Musculoskeletal: Normal range of motion.  Lymphadenopathy:    She has no cervical adenopathy.  Neurological: She is alert and oriented to person, place, and time.  Skin: Skin is warm and dry. No rash noted.  Psychiatric: She has a normal mood and affect. Her behavior is normal.          Assessment & Plan:   Essential hypertension.  Reasonable control Mild cognitive impairment.  Continue Aricept History of syncope, stable  Neurology follow-up as scheduled Continue home blood pressure monitoring and low-salt diet  Follow-up here 6 months  [name]

## 2016-12-15 NOTE — Patient Instructions (Addendum)
WE NOW OFFER   [facility] FAST TRACK!!!  SAME DAY Appointments for ACUTE CARE  Such as: Sprains, Injuries, cuts, abrasions, rashes, muscle pain, joint pain, back pain Colds, flu, sore throats, headache, allergies, cough, fever  Ear pain, sinus and eye infections Abdominal pain, nausea, vomiting, diarrhea, upset stomach Animal/insect bites  3 Easy Ways to Schedule: Walk-In Scheduling Call in scheduling Mychart Sign-up: [email or web address]     Limit your sodium (Salt) intake  Please check your blood pressure on a regular basis.  If it is consistently greater than 150/90, please make an office appointment.  Neurology follow-up as scheduled

## 2016-12-24 ENCOUNTER — Ambulatory Visit: Payer: Medicare HMO | Admitting: Neurology

## 2016-12-29 ENCOUNTER — Ambulatory Visit (INDEPENDENT_AMBULATORY_CARE_PROVIDER_SITE_OTHER): Payer: Medicare HMO | Admitting: Internal Medicine

## 2016-12-29 ENCOUNTER — Encounter: Payer: Self-pay | Admitting: Internal Medicine

## 2016-12-29 VITALS — BP 130/70 | HR 60 | Temp 98.1°F | Ht 60.0 in | Wt 163.4 lb

## 2016-12-29 DIAGNOSIS — F0390 Unspecified dementia without behavioral disturbance: Secondary | ICD-10-CM | POA: Diagnosis not present

## 2016-12-29 DIAGNOSIS — I10 Essential (primary) hypertension: Secondary | ICD-10-CM

## 2016-12-29 DIAGNOSIS — Z0000 Encounter for general adult medical examination without abnormal findings: Secondary | ICD-10-CM | POA: Diagnosis not present

## 2016-12-29 DIAGNOSIS — E876 Hypokalemia: Secondary | ICD-10-CM | POA: Diagnosis not present

## 2016-12-29 DIAGNOSIS — R55 Syncope and collapse: Secondary | ICD-10-CM

## 2016-12-29 DIAGNOSIS — R69 Illness, unspecified: Secondary | ICD-10-CM | POA: Diagnosis not present

## 2016-12-29 DIAGNOSIS — F03A0 Unspecified dementia, mild, without behavioral disturbance, psychotic disturbance, mood disturbance, and anxiety: Secondary | ICD-10-CM

## 2016-12-29 LAB — COMPREHENSIVE METABOLIC PANEL
ALBUMIN: 4 g/dL (ref 3.5–5.2)
ALT: 15 U/L (ref 0–35)
AST: 19 U/L (ref 0–37)
Alkaline Phosphatase: 67 U/L (ref 39–117)
BUN: 26 mg/dL — ABNORMAL HIGH (ref 6–23)
CALCIUM: 9.4 mg/dL (ref 8.4–10.5)
CHLORIDE: 106 meq/L (ref 96–112)
CO2: 30 meq/L (ref 19–32)
Creatinine, Ser: 1.29 mg/dL — ABNORMAL HIGH (ref 0.40–1.20)
GFR: 50.98 mL/min — ABNORMAL LOW (ref >60.00)
Glucose, Bld: 111 mg/dL — ABNORMAL HIGH (ref 70–99)
POTASSIUM: 3.7 meq/L (ref 3.5–5.1)
SODIUM: 143 meq/L (ref 135–145)
Total Bilirubin: 0.6 mg/dL (ref 0.2–1.2)
Total Protein: 6.6 g/dL (ref 6.0–8.3)

## 2016-12-29 LAB — CBC WITH DIFFERENTIAL/PLATELET
BASOS PCT: 0.5 % (ref 0.0–3.0)
Basophils Absolute: 0 10*3/uL (ref 0.0–0.1)
EOS ABS: 0 10*3/uL (ref 0.0–0.7)
Eosinophils Relative: 0.4 % (ref 0.0–5.0)
HEMATOCRIT: 38.5 % (ref 36.0–46.0)
Hemoglobin: 12.5 g/dL (ref 12.0–15.0)
LYMPHS ABS: 1.2 10*3/uL (ref 0.7–4.0)
Lymphocytes Relative: 13.2 % (ref 12.0–46.0)
MCHC: 32.5 g/dL (ref 30.0–36.0)
MCV: 95.9 fl (ref 78.0–100.0)
MONO ABS: 0.5 10*3/uL (ref 0.1–1.0)
Monocytes Relative: 5.5 % (ref 3.0–12.0)
NEUTROS ABS: 7.1 10*3/uL (ref 1.4–7.7)
NEUTROS PCT: 80.4 % — AB (ref 43.0–77.0)
PLATELETS: 287 10*3/uL (ref 150.0–400.0)
RBC: 4.01 Mil/uL (ref 3.87–5.11)
RDW: 15.2 % (ref 11.5–15.5)
WBC: 8.8 10*3/uL (ref 4.0–10.5)

## 2016-12-29 LAB — LIPID PANEL
CHOLESTEROL: 172 mg/dL (ref 0–200)
HDL: 62.7 mg/dL (ref >39.00)
LDL CALC: 98 mg/dL (ref 0–99)
NonHDL: 108.95
TRIGLYCERIDES: 54 mg/dL (ref 0.0–149.0)
Total CHOL/HDL Ratio: 3
VLDL: 10.8 mg/dL (ref 0.0–40.0)

## 2016-12-29 LAB — TSH: TSH: 4.48 u[IU]/mL (ref 0.35–4.50)

## 2016-12-29 NOTE — Patient Instructions (Signed)
Limit your sodium (Salt) intake  Please check your blood pressure on a regular basis.  If it is consistently greater than 150/90, please make an office appointment.  Return in 6 months for follow-up   

## 2016-12-29 NOTE — Progress Notes (Signed)
Subjective:    Patient ID: Evelyn Irwin, female    DOB: [birth year], 83 y.o.   MRN: [id number]  HPI  83 year old patient who is seen today for an annual exam and Medicare wellness visit.  She is accompanied by her husband. She has a history of multi-drug-resistant hypertension. She has been evaluated by urology with recurrent syncope of unclear etiology.  She has had full cardiac and neurological evaluation.  She also has a history of mild dementia and has [facility].  She remains on donepezil, which she continues to tolerate well.  She also is on [facility]dal [facility] due to a history of hallucinations and paranoia.  This medication has been quite helpful.  No recent syncope. No concerns or complaints today  Past Medical History:  Diagnosis Date  . ALLERGIC RHINITIS 11/11/2006  . ANXIETY 11/11/2006  . CONSTIPATION 02/23/2008  . GERD 10/06/2007  . HYPERTENSION 11/11/2006  . HYPOKALEMIA, HX OF 02/09/2008  . Memory loss   . OSTEOARTHRITIS 11/11/2006     Social History   Social History  . Marital status: Married    Spouse name: N/A  . Number of children: N/A  . Years of education: N/A   Occupational History  . Not on file.   Social History Main Topics  . Smoking status: Never Smoker  . Smokeless tobacco: Never Used  . Alcohol use No  . Drug use: No  . Sexual activity: Not on file   Other Topics Concern  . Not on file   Social History Narrative  . No narrative on file    Past Surgical History:  Procedure Laterality Date  . EP IMPLANTABLE DEVICE N/A 03/21/2015   Procedure: Loop Recorder Removal;  Surgeon: Hillis RangeJames Allred, MD;  Location: [facility];  Service: Cardiovascular;  Laterality: N/A;  . LOOP RECORDER IMPLANT N/A 03/30/2014   MDT LINQ implanted by Dr Johney FrameAllred for syncope  . TOTAL KNEE ARTHROPLASTY     Right    Family History  Problem Relation Age of Onset  . Stroke Mother   . Stroke Father   . Cancer Sister   . Stroke Brother    . Stroke Brother     Allergies  Allergen Reactions  . Depakote Er [Divalproex Sodium Er] Hives    Current Outpatient Prescriptions on File Prior to Visit  Medication Sig Dispense Refill  . amLODipine (NORVASC) 10 MG tablet TAKE 1 TABLET BY MOUTH EVERY DAY 90 tablet 1  . cloNIDine (CATAPRES) 0.1 MG tablet 1 tablet daily at bedtime 90 tablet 0  . donepezil (ARICEPT) 10 MG tablet Take 1 tablet (10 mg total) by mouth at bedtime. 90 tablet 5  . metoprolol (LOPRESSOR) 50 MG tablet Take 0.5 tablets (25 mg total) by mouth 2 (two) times daily. 180 tablet 2  . Misc Natural Products (ADRENAL PO) Take 1 tablet by mouth daily as needed (SINUSES).     . risperiDONE (RISPERDAL) 0.25 MG tablet TAKE 1 TABLET (0.25 MG TOTAL) BY MOUTH 2 (TWO) TIMES DAILY.  5  . telmisartan (MICARDIS) 80 MG tablet TAKE 1 TABLET BY MOUTH EVERY DAY 90 tablet 1   No current facility-administered medications on file prior to visit.     BP 130/70 (BP Location: Left Arm, Patient Position: Sitting, Cuff Size: Normal)   Pulse 60   Temp 98.1 F (36.7 C) (Oral)   Ht 5' (1.524 m)   Wt 163 lb 6.4 oz (74.1 kg)   SpO2 98%  BMI 31.91 kg/m   Medicare wellness visit  1. Risk factors, based on past  M,S,F history.  Cardio vascular risk factors include hypertension  2.  Physical activities: fairly sedentary.  Does walk small distances 2-3 times per week  3.  Depression/mood:no history of major depression or mood disorder.  Does have mild cognitive impairment and history of paranoia with hallucinations  4.  Hearing:no significant deficits  5.  ADL's:does not drive.  Fairly independent with minimal assistance from her husband  6.  Fall risk:low to moderate.  No falls within the past year  7.  Home safety:no problems identified  8.  Height weight, and visual acuity;  Height and weight stable no change in visual acuity.    9.  Counseling: encourage more physical activity as well as brain stimulation exercises to promote  brain health  10. Lab orders based on risk factors:we'll check laboratory update including thyroid function studies  11. Referral :follow-up neurology  12. Care plan:continue efforts at aggressive risk factor modification  13. Cognitive assessment: history mild dementia.  MMSE  23/30  14. Screening: Patient provided with a written and personalized 5-10 year screening schedule in the AVS.    15. Provider List Update: neurology primary care and cardiology     Review of Systems  Constitutional: Negative.   HENT: Negative for congestion, dental problem, hearing loss, rhinorrhea, sinus pressure, sore throat and tinnitus.   Eyes: Negative for pain, discharge and visual disturbance.  Respiratory: Negative for cough and shortness of breath.   Cardiovascular: Negative for chest pain, palpitations and leg swelling.  Gastrointestinal: Negative for abdominal distention, abdominal pain, blood in stool, constipation, diarrhea, nausea and vomiting.  Genitourinary: Negative for difficulty urinating, dysuria, flank pain, frequency, hematuria, pelvic pain, urgency, vaginal bleeding, vaginal discharge and vaginal pain.  Musculoskeletal: Negative for arthralgias, gait problem and joint swelling.  Skin: Negative for rash.  Neurological: Negative for dizziness, syncope, speech difficulty, weakness, numbness and headaches.  Hematological: Negative for adenopathy.  Psychiatric/Behavioral: Positive for behavioral problems and decreased concentration. Negative for agitation and dysphoric mood. The patient is nervous/anxious.        Objective:   Physical Exam  Constitutional: She is oriented to person, place, and time. She appears well-developed and well-nourished.  Blood pressure 130/80  HENT:  Head: Normocephalic and atraumatic.  Right Ear: External ear normal.  Left Ear: External ear normal.  Mouth/Throat: Oropharynx is clear and moist.  Pharyngeal crowding  Eyes: Conjunctivae and EOM are normal.    Neck: Normal range of motion. Neck supple. No JVD present. No thyromegaly present.  Cardiovascular: Normal rate, regular rhythm, normal heart sounds and intact distal pulses.   No murmur heard. Pedal pulses full  Pulmonary/Chest: Effort normal and breath sounds normal. She has no wheezes. She has no rales.  Abdominal: Soft. Bowel sounds are normal. She exhibits no distension and no mass. There is no tenderness. There is no rebound and no guarding.  Genitourinary: Vagina normal.  Musculoskeletal: Normal range of motion. She exhibits no edema or tenderness.  Status post right total knee replacement surgery  Neurological: She is alert and oriented to person, place, and time. She has normal reflexes. No cranial nerve deficit. She exhibits normal muscle tone. Coordination normal.  Skin: Skin is warm and dry. No rash noted.  Psychiatric: She has a normal mood and affect. Her behavior is normal.          Assessment & Plan:   Preventive health examination Subsequent Medicare wellness visit  Essential hypertension, controlled Mild senile dementia.  No change in therapy.  Follow-up neurology History of syncope, stable at present  We'll review screening lab Medications updated Follow-up 6 months Continue home blood pressure monitoring  KWIATKOWSKI,PETER Homero FellersFRANK

## 2017-01-16 ENCOUNTER — Ambulatory Visit (INDEPENDENT_AMBULATORY_CARE_PROVIDER_SITE_OTHER): Payer: Medicare HMO | Admitting: Neurology

## 2017-01-16 ENCOUNTER — Encounter: Payer: Self-pay | Admitting: Neurology

## 2017-01-16 VITALS — BP 124/70 | HR 54 | Ht 60.0 in | Wt 163.0 lb

## 2017-01-16 DIAGNOSIS — F03A0 Unspecified dementia, mild, without behavioral disturbance, psychotic disturbance, mood disturbance, and anxiety: Secondary | ICD-10-CM

## 2017-01-16 DIAGNOSIS — R55 Syncope and collapse: Secondary | ICD-10-CM | POA: Diagnosis not present

## 2017-01-16 DIAGNOSIS — R69 Illness, unspecified: Secondary | ICD-10-CM | POA: Diagnosis not present

## 2017-01-16 DIAGNOSIS — F0390 Unspecified dementia without behavioral disturbance: Secondary | ICD-10-CM

## 2017-01-16 MED ORDER — MEMANTINE HCL 10 MG PO TABS: ORAL_TABLET | ORAL | 11 refills | Status: DC

## 2017-01-16 NOTE — Progress Notes (Signed)
NEUROLOGY FOLLOW UP OFFICE NOTE  Evelyn Irwin [id number]  HISTORY OF PRESENT ILLNESS: I had the pleasure of seeing Tonilynn Tennyson in follow-up in the neurology clinic on 01/16/2017. The patient was last seen 3 months ago and is again accompanied by her husband who helps supplement the history today. They deny any full syncopal episodes since July 2017. She had an episode of near syncope last March 2018. On her last visit, her husband reported worsening memory. MMSE in May 2018 was 23 30 (26/30 in September 2017). She is taking Aricept 10mg  daily without side effects. We discussed adding on Namenda, but she felt she was taking too much medication. They present for an earlier visit, it appears her husband is hesitant to speak much in front of her, he mostly reports she does not put things back in their place anymore, he is always searching for things and she would get upset at him. She gets upset in the office and states they should just live apart. He is in charge of her medications and bills. She does not drive. He does the cooking. He reports her anxiety medication was stopped a few months ago, he has not noticed any difference off medication. [facility] reports [facility], she does not talk much. No difficulties with ADLs. She denies any headaches, dizziness, vision changes, focal numbness/tingling/weakness, no falls.  HPI: This is an 83 yo RH woman with a history of hypertension who presented with recurrent syncopal episodes. The first episode occurred in 2013 when she woke up on the kitchen floor confused. She was admitted to [facility] where workup was unrevealing. Echo had shown basal septal hypertrophy and dynamic obstruction. She reports had another episode last August 2015 where she woke up on the grass, again feeling confused after. No tongue bite or urinary incontinence. She has chronic tinnitus and reports that the ringing in her ears may have gotten a little louder. She denied  feeling dizzy or diaphoretic prior to the episodes. She had an ILR in October 2015. Her head CT was unremarkable. Routine EEG showed occasional left temporal slowing, however her 24-hour EEG did not show similar changes and was within normal limits. She was unable to get an MRI due to loop recorder. She has seen cardiology with normal EF. She had another event last 09/25/14 while she was lying on bed watching a ballgame. She has chronic tinnitus but recalls the tinnitus increased, then when she came to, she was lying on her back and felt very confused. She got up and went to the bathroom and felt she did not recognize anything. She walked through all the bedrooms looking for "that man" (her husband), then her memory started coming back. She saw "that man sitting on the chair," but did not speak to him and went back to the room. When he came to find her 20 minutes later, her symptoms had cleared but she did not feel "quite right." No tongue bite or incontinence. She denied any associated headache but thinks she felt this episode coming on a few days prior because she was having increased ringing in her ears for 2 days in a row (no associated confusion). Loop recorder did not indicate any cardiac abnormalities.  She and her husband recall a curious episode in 2010 or 2011 when she suddenly felt sleepy and fell across the bed. When she got up she did not know where and who she was, did not recognize their kitchen.  She went back to sleep and woke up fine.   She feels that since the syncopal episode in August 2015, she does not remember things like she used to. They were on a cruise last November, and she forgot they were onboard a ship. She would move things around in their house and they cannot find them. She has only missed 1 bill payment, denies missing medications. No family history of similar symptoms.   Diagnostic Data: Head CT which did not show any acute changes. Her routine EEG had shown non-specific  slowing in the left temporal region, a 24-hour EEG did not show similar findings, and was within normal limits.   PAST MEDICAL HISTORY: Past Medical History:  Diagnosis Date  . ALLERGIC RHINITIS 11/11/2006  . ANXIETY 11/11/2006  . CONSTIPATION 02/23/2008  . GERD 10/06/2007  . HYPERTENSION 11/11/2006  . HYPOKALEMIA, HX OF 02/09/2008  . Memory loss   . OSTEOARTHRITIS 11/11/2006    MEDICATIONS:  Outpatient Encounter Prescriptions as of 01/16/2017  Medication Sig  . amLODipine (NORVASC) 10 MG tablet TAKE 1 TABLET BY MOUTH EVERY DAY  . cloNIDine (CATAPRES) 0.1 MG tablet 1 tablet daily at bedtime  . donepezil (ARICEPT) 10 MG tablet Take 1 tablet (10 mg total) by mouth at bedtime.  . metoprolol (LOPRESSOR) 50 MG tablet Take 0.5 tablets (25 mg total) by mouth 2 (two) times daily.  . Misc Natural Products (ADRENAL PO) Take 1 tablet by mouth daily as needed (SINUSES).   . risperiDONE (RISPERDAL) 0.25 MG tablet TAKE 1 TABLET (0.25 MG TOTAL) BY MOUTH 2 (TWO) TIMES DAILY.  Marland Kitchen telmisartan (MICARDIS) 80 MG tablet TAKE 1 TABLET BY MOUTH EVERY DAY   No facility-administered encounter medications on file as of 01/16/2017.     ALLERGIES: Allergies  Allergen Reactions  . Depakote Er [Divalproex Sodium Er] Hives    FAMILY HISTORY: Family History  Problem Relation Age of Onset  . Stroke Mother   . Stroke Father   . Cancer Sister   . Stroke Brother   . Stroke Brother     SOCIAL HISTORY: Social History   Social History  . Marital status: Married    Spouse name: N/A  . Number of children: N/A  . Years of education: N/A   Occupational History  . Not on file.   Social History Main Topics  . Smoking status: Never Smoker  . Smokeless tobacco: Never Used  . Alcohol use No  . Drug use: No  . Sexual activity: Not on file   Other Topics Concern  . Not on file   Social History Narrative  . No narrative on file    REVIEW OF SYSTEMS: Constitutional: No fevers, chills, or sweats, no  generalized fatigue, change in appetite Eyes: No visual changes, double vision, eye pain Ear, nose and throat: No hearing loss, ear pain, nasal congestion, sore throat Cardiovascular: No chest pain, palpitations Respiratory:  No shortness of breath at rest or with exertion, wheezes GastrointestinaI: No nausea, vomiting, diarrhea, abdominal pain, fecal incontinence Genitourinary:  No dysuria, urinary retention or frequency Musculoskeletal:  No neck pain, back pain Integumentary: No rash, pruritus, skin lesions Neurological: as above Psychiatric: No depression, insomnia, anxiety Endocrine: No palpitations, fatigue, diaphoresis, mood swings, change in appetite, change in weight, increased thirst Hematologic/Lymphatic:  No anemia, purpura, petechiae. Allergic/Immunologic: no itchy/runny eyes, nasal congestion, recent allergic reactions, rashes  PHYSICAL EXAM: Vitals:   01/16/17 1124  BP: 124/70  Pulse: (!) 54  SpO2: 98%   General: No acute  distress Head:  Normocephalic/atraumatic Neck: supple, no paraspinal tenderness, full range of motion Heart:  Regular rate and rhythm Lungs:  Clear to auscultation bilaterally Back: No paraspinal tenderness Skin/Extremities: No rash, no edema Neurological Exam: alert and oriented to person, place, and month/season. States it is Monday, 2017 (it is Friday, 2018). No aphasia or dysarthria. Fund of knowledge is appropriate.  Remote memory intact. 0/3 delayed recall. Attention and concentration are normal.    Able to name objects and repeat phrases. CDT 4/5 MMSE - Mini Mental State Exam 01/16/2017 10/01/2016 02/29/2016  Orientation to time 2 3 4   Orientation to Place 4 3 5   Registration 3 3 3   Attention/ Calculation 5 5 5   Recall 0 0 0  Language- name 2 objects 2 2 2   Language- repeat 1 1 1   Language- follow 3 step command 3 3 3   Language- read & follow direction 1 1 1   Write a sentence 1 1 1   Copy design 1 1 1   Total score 23 23 26     Cranial  nerves: Pupils equal, round, reactive to light. Extraocular movements intact with no nystagmus. Visual fields full. Facial sensation intact. No facial asymmetry. Tongue, uvula, palate midline.  Motor: Bulk and tone normal, muscle strength 5/5 throughout with no pronator drift.  Sensation to light touch intact.  No extinction to double simultaneous stimulation.  Deep tendon reflexes 1+ throughout, toes downgoing.  Finger to nose testing intact.  Gait narrow-based and steady, slight difficulty with tandem walk but able (similar to prior).  Romberg negative.  IMPRESSION: This is an 83 yo RH woman with a history of hypertension, recurrent syncope of unclear etiology, and dementia. No clear epilepsy risk factors, neurological exam is normal. Her head CT was unremarkable. Routine EEG had shown occasional left temporal slowing, however her 24-hour EEG did not show similar changes, typical events were not captured. Cardiac workup unrevealing, with no arrhythmia noted on loop monitor during an event. She has been evaluated at [facility], concern for confusional episodes due to neurodegenerative disorder. No further episodes since July 2017. Neurocognitive testing at [facility] showing dementia per reports. MMSE today 23/30 (26/30 in September 2017). We had discussed adding on Namenda to Aricept on last visit, which she declined. They present for an earlier visit due to worsening memory but it appears husband is hesitant to speak in front of her, he is asking if I would speak to his children when they call. We discussed proceeding with adding on Namenda, start 10mg  qhs x 1 month, then increase to 1 tab BID. No further hallucinations, on Risperdal. We discussed mood changes, she may benefit from starting an SSRI, they will discuss this with her PCP. We discussed home safety and needing more help at home. We again discussed the importance of control of vascular risk factors, physical exercise, and brain stimulation exercises for brain  health. She will follow-up in 6 months and knows to call for any changes.   Thank you for allowing me to participate in her care.  Please do not hesitate to call for any questions or concerns.  The duration of this appointment visit was 25 minutes of face-to-face time with the patient.  Greater than 50% of this time was spent in counseling, explanation of diagnosis, planning of further management, and coordination of care.   Patrcia Dolly, M.D.   CC: Dr. Amador Cunas

## 2017-01-16 NOTE — Patient Instructions (Signed)
1. Start Namenda 10mg : take 1 tablet every night for 1 month, then increase to 1 tablet twice a day 2. Continue Donepezil 10mg  daily 3. Discuss depression with Dr. Amador Cunas 4. Control of blood pressure, cholesterol, as well as physical exercise and brain stimulation exercises are important for brain health 5. Follow-up in 6 months, call for any changes

## 2017-01-22 ENCOUNTER — Other Ambulatory Visit: Payer: Self-pay | Admitting: Internal Medicine

## 2017-01-26 ENCOUNTER — Telehealth: Payer: Self-pay | Admitting: Neurology

## 2017-01-26 ENCOUNTER — Telehealth: Payer: Self-pay

## 2017-01-26 NOTE — Telephone Encounter (Signed)
Dr. Karel Jarvis will be out of the office that day.

## 2017-01-26 NOTE — Telephone Encounter (Signed)
PT's spouse Fayrene Fearing called and asked if Dr Karel Jarvis can talk to their daughters on the 21st of September, they will be in town regarding PT's treatment and care, please advise   CB# [phone]

## 2017-01-26 NOTE — Telephone Encounter (Signed)
Routing because I wasn't sure if you might want to call pt's daughters and go over everything or not.

## 2017-01-26 NOTE — Telephone Encounter (Signed)
ERROR

## 2017-01-27 NOTE — Telephone Encounter (Signed)
Pls let him know I am out that weekend. I can do a conference call with them the Monday/Tues noontime the week of Sept 17/18 or the week after (Sept 24/25). Pls have him give [place] the number I would call for the conference call. Thanks

## 2017-01-29 NOTE — Telephone Encounter (Signed)
Returned call.  Pt answered.  Husband was not home.  I asked that she have him return my call

## 2017-01-29 NOTE — Telephone Encounter (Signed)
Called husband's cell phone.  No answer.  LMOVM relaying message below as well as asking that he return my call.

## 2017-02-01 ENCOUNTER — Other Ambulatory Visit: Payer: Self-pay | Admitting: Internal Medicine

## 2017-02-03 ENCOUNTER — Telehealth: Payer: Self-pay | Admitting: Neurology

## 2017-02-03 NOTE — Telephone Encounter (Signed)
Spoke with pt's husband.  He said that conference call(s) would be fine.  He isn't too sure of daughter's schedule's, but let me know that both were listed on pt's DPR.  Dimas MillinHeidi Mothershead - [phone] Mariella SaaNicole Gailey - [phone]

## 2017-02-03 NOTE — Telephone Encounter (Signed)
Her husband is calling regarding his daughter getting medical information on their mother. They will be in town and would like to speak with Dr. Karel JarvisAquino regarding Kaylan. He said he is retuning a missed call from Dr. Karel JarvisAquino. Thanks

## 2017-02-12 ENCOUNTER — Telehealth: Payer: Self-pay | Admitting: Neurology

## 2017-02-12 NOTE — Telephone Encounter (Signed)
Patient's daughter Trudie Buckler([name]) called regarding a Conference call that her and her sister will be on with Dr. Karel JarvisAquino. Doctor Karel JarvisAquino had given them a couple dates to choose from and Trudie BucklerHeidi was wanting to see if Dr. Karel JarvisAquino would be calling her and her sister, or would they be calling into Dr. Karel JarvisAquino? Please Advise. Thanks

## 2017-02-16 NOTE — Telephone Encounter (Signed)
Pls let daughter know I will call them, pls get the number for me to call. What is better for them, 9/24 or 9/25 at 12pm? Thanks

## 2017-02-16 NOTE — Telephone Encounter (Signed)
Spoke with Evelyn Irwin, she states that 9/24 at 12 noon works well for her.  Her phone number is [phone].  She is calling her sister now to see what time and number work for her.

## 2017-02-16 NOTE — Telephone Encounter (Signed)
See other phone note

## 2017-02-19 ENCOUNTER — Telehealth: Payer: Self-pay | Admitting: Neurology

## 2017-02-19 NOTE — Telephone Encounter (Signed)
Spoke with [name].  She says that Monday and noon still works for her and her sister, Joni Reining. They can be reached at the numbers below   [name]; [phone] Nicole; [phone]

## 2017-02-19 NOTE — Telephone Encounter (Signed)
[name] pt's daughter called in regards to a conference call for her mother, she said Monday the 24th will be good but would like a call back to confirm that is still ok and also to give you numbers for her sister and her because she will be at work on that day  CB# [phone]

## 2017-02-20 ENCOUNTER — Other Ambulatory Visit: Payer: Self-pay | Admitting: Internal Medicine

## 2017-02-23 NOTE — Telephone Encounter (Signed)
Spoke to both daughters. They had several concerns, first that she is taking 9 medications and wonders if there is communication with her doctors, reviewed medications, she is on 4 BP medications, then Aricept and Namenda added recently. They will discuss BP meds with PCP. They are also concerned about her weight loss and will discuss with PCP. I discussed with them that if there is depression with the dementia, it may be manifesting as poor appetite also. They expressed concern that she does not want to go out, afraid she will pass out. Discussed encouraging patient. Also discussed the importance of exercise and brain stimulation exercises, Mediterranean diet.  We discussed the diagnosis, she has been diagnosed with Alzheimer's disease on her last visit at [facility]. We discussed at this point she is at mild stage, but Namenda was added because of her husband's report of worsening on Aricept. Her daughter reported she misplaces and hides items, and would blame her husband. They do agree he has caregiver fatigue. We discussed the importance of planning for the future, looking into getting a home aide or looking into assisted living. They are interested in getting more help at home and ask for more resources/info.   Meagen, can you pls send to both daughters some brochures on home health aide. Shanda Bumps, our Child psychotherapist, also sent out a form for [employer] Referral, pls print it out and mail to both daughters also. Here are their addressese:  [facility] [address], [place] [address]  [facility] [address], [place], [place] [address]  Thanks! KA

## 2017-02-23 NOTE — Telephone Encounter (Signed)
Letters and brochures sent to pt's daughters at addresses listed below.

## 2017-03-06 DIAGNOSIS — R69 Illness, unspecified: Secondary | ICD-10-CM | POA: Diagnosis not present

## 2017-04-14 DIAGNOSIS — G309 Alzheimer's disease, unspecified: Secondary | ICD-10-CM | POA: Diagnosis not present

## 2017-04-14 DIAGNOSIS — R69 Illness, unspecified: Secondary | ICD-10-CM | POA: Diagnosis not present

## 2017-04-19 ENCOUNTER — Other Ambulatory Visit: Payer: Self-pay | Admitting: Internal Medicine

## 2017-04-27 ENCOUNTER — Other Ambulatory Visit: Payer: Self-pay | Admitting: Internal Medicine

## 2017-06-08 ENCOUNTER — Other Ambulatory Visit: Payer: Self-pay | Admitting: Internal Medicine

## 2017-06-10 NOTE — Telephone Encounter (Signed)
Sent to the pharmacy by e-scribe.  Due for cpx/yearly visit 11/2017 Has upcoming appt with Dr. Kirtland BouchardK on 06/17/17 for 6 month follow up.

## 2017-06-17 ENCOUNTER — Ambulatory Visit: Payer: Medicare HMO | Admitting: Internal Medicine

## 2017-06-17 ENCOUNTER — Encounter: Payer: Self-pay | Admitting: Internal Medicine

## 2017-06-17 VITALS — BP 148/80 | HR 74 | Temp 98.0°F | Ht 60.0 in | Wt 164.6 lb

## 2017-06-17 DIAGNOSIS — I10 Essential (primary) hypertension: Secondary | ICD-10-CM | POA: Diagnosis not present

## 2017-06-17 DIAGNOSIS — F0390 Unspecified dementia without behavioral disturbance: Secondary | ICD-10-CM

## 2017-06-17 DIAGNOSIS — R55 Syncope and collapse: Secondary | ICD-10-CM

## 2017-06-17 DIAGNOSIS — F03A0 Unspecified dementia, mild, without behavioral disturbance, psychotic disturbance, mood disturbance, and anxiety: Secondary | ICD-10-CM

## 2017-06-17 DIAGNOSIS — R69 Illness, unspecified: Secondary | ICD-10-CM | POA: Diagnosis not present

## 2017-06-17 NOTE — Progress Notes (Signed)
Subjective:    Patient ID: Evelyn Irwin, female    DOB: Jun 10, 1933, 84 y.o.   MRN: [id number]  HPI  84 year old patient who is followed close by neurology with a history of syncope and dementia. She has a history of essential hypertension and is seen today for her 7122-month follow-up.  She is doing quite well.  She is accompanied by her husband. No new concerns or complaints;  [facility] is [facility] and locally.  No further syncope  Past Medical History:  Diagnosis Date  . ALLERGIC RHINITIS 11/11/2006  . ANXIETY 11/11/2006  . CONSTIPATION 02/23/2008  . GERD 10/06/2007  . HYPERTENSION 11/11/2006  . HYPOKALEMIA, HX OF 02/09/2008  . Memory loss   . OSTEOARTHRITIS 11/11/2006     Social History   Socioeconomic History  . Marital status: Married    Spouse name: Not on file  . Number of children: Not on file  . Years of education: Not on file  . Highest education level: Not on file  Social Needs  . Financial resource strain: Not on file  . Food insecurity - worry: Not on file  . Food insecurity - inability: Not on file  . Transportation needs - medical: Not on file  . Transportation needs - non-medical: Not on file  Occupational History  . Not on file  Tobacco Use  . Smoking status: Never Smoker  . Smokeless tobacco: Never Used  Substance and Sexual Activity  . Alcohol use: No    Alcohol/week: 0.0 oz  . Drug use: No  . Sexual activity: Not on file  Other Topics Concern  . Not on file  Social History Narrative  . Not on file    Past Surgical History:  Procedure Laterality Date  . EP IMPLANTABLE DEVICE N/A 03/21/2015   Procedure: Loop Recorder Removal;  Surgeon: Hillis RangeJames Allred, MD;  Location: [facility] INVASIVE CV LAB;  Service: Cardiovascular;  Laterality: N/A;  . LOOP RECORDER IMPLANT N/A 03/30/2014   MDT LINQ implanted by Dr Johney FrameAllred for syncope  . TOTAL KNEE ARTHROPLASTY     Right    Family History  Problem Relation Age of Onset  . Stroke Mother   . Stroke  Father   . Cancer Sister   . Stroke Brother   . Stroke Brother     Allergies  Allergen Reactions  . Depakote Er [Divalproex Sodium Er] Hives    Current Outpatient Medications on File Prior to Visit  Medication Sig Dispense Refill  . amLODipine (NORVASC) 10 MG tablet TAKE 1 TABLET BY MOUTH EVERY DAY 90 tablet 0  . cloNIDine (CATAPRES) 0.1 MG tablet TAKE 1 TABLET BY MOUTH EVERYDAY AT BEDTIME 90 tablet 1  . donepezil (ARICEPT) 10 MG tablet Take 1 tablet (10 mg total) by mouth at bedtime. 90 tablet 5  . memantine (NAMENDA) 10 MG tablet Take 1 tablet at night for 1 month, then increase to 1 tablet twice a day 60 tablet 11  . metoprolol (LOPRESSOR) 50 MG tablet Take 0.5 tablets (25 mg total) by mouth 2 (two) times daily. 180 tablet 2  . Misc Natural Products (ADRENAL PO) Take 1 tablet by mouth daily as needed (SINUSES).     . risperiDONE (RISPERDAL) 0.25 MG tablet TAKE 1 TABLET (0.25 MG TOTAL) BY MOUTH 2 (TWO) TIMES DAILY.  5  . telmisartan (MICARDIS) 80 MG tablet TAKE 1 TABLET BY MOUTH EVERY DAY 90 tablet 0   No current facility-administered medications on file prior to visit.  BP (!) 148/80 (BP Location: Left Arm, Patient Position: Sitting, Cuff Size: Normal)   Pulse 74   Temp 98 F (36.7 C) (Oral)   Ht 5' (1.524 m)   Wt 164 lb 9.6 oz (74.7 kg)   SpO2 97%   BMI 32.15 kg/m     Review of Systems  Constitutional: Negative.   HENT: Negative for congestion, dental problem, hearing loss, rhinorrhea, sinus pressure, sore throat and tinnitus.   Eyes: Negative for pain, discharge and visual disturbance.  Respiratory: Negative for cough and shortness of breath.   Cardiovascular: Negative for chest pain, palpitations and leg swelling.  Gastrointestinal: Negative for abdominal distention, abdominal pain, blood in stool, constipation, diarrhea, nausea and vomiting.  Genitourinary: Negative for difficulty urinating, dysuria, flank pain, frequency, hematuria, pelvic pain, urgency, vaginal  bleeding, vaginal discharge and vaginal pain.  Musculoskeletal: Negative for arthralgias, gait problem and joint swelling.  Skin: Negative for rash.  Neurological: Positive for syncope. Negative for dizziness, speech difficulty, weakness, numbness and headaches.  Hematological: Negative for adenopathy.  Psychiatric/Behavioral: Positive for confusion and decreased concentration. Negative for agitation, behavioral problems and dysphoric mood. The patient is not nervous/anxious.        Objective:   Physical Exam  Constitutional: She is oriented to person, place, and time. She appears well-developed and well-nourished. No distress.  Blood pressure 140/76  HENT:  Head: Normocephalic.  Right Ear: External ear normal.  Left Ear: External ear normal.  Mouth/Throat: Oropharynx is clear and moist.  Eyes: Conjunctivae and EOM are normal. Pupils are equal, round, and reactive to light.  Neck: Normal range of motion. Neck supple. No thyromegaly present.  Cardiovascular: Normal rate, regular rhythm, normal heart sounds and intact distal pulses.  Pulmonary/Chest: Effort normal and breath sounds normal.  Abdominal: Soft. Bowel sounds are normal. She exhibits no mass. There is no tenderness.  Musculoskeletal: Normal range of motion.  Lymphadenopathy:    She has no cervical adenopathy.  Neurological: She is alert and oriented to person, place, and time.  Skin: Skin is warm and dry. No rash noted.  Psychiatric: She has a normal mood and affect. Her behavior is normal.          Assessment & Plan:   Essential hypertension stable.  No change in medical regimen Senile dementia.  Follow-up neurology History of syncope stable  Follow-up 6 months Follow-up neurology as scheduled Medications updated  Rogelia Boga

## 2017-06-17 NOTE — Patient Instructions (Signed)
Limit your sodium (Salt) intake  Neurology follow-up as scheduled  Return in 6 months for follow-up

## 2017-07-10 ENCOUNTER — Emergency Department (HOSPITAL_COMMUNITY)
Admission: EM | Admit: 2017-07-10 | Discharge: 2017-07-10 | Disposition: A | Discharge status: Home / Self Care | Payer: Medicare HMO | Attending: Emergency Medicine | Admitting: Emergency Medicine

## 2017-07-10 ENCOUNTER — Encounter (HOSPITAL_COMMUNITY): Payer: Self-pay

## 2017-07-10 DIAGNOSIS — Z79899 Other long term (current) drug therapy: Secondary | ICD-10-CM | POA: Diagnosis not present

## 2017-07-10 DIAGNOSIS — I10 Essential (primary) hypertension: Secondary | ICD-10-CM | POA: Diagnosis not present

## 2017-07-10 DIAGNOSIS — Z96651 Presence of right artificial knee joint: Secondary | ICD-10-CM | POA: Diagnosis not present

## 2017-07-10 DIAGNOSIS — R531 Weakness: Secondary | ICD-10-CM | POA: Diagnosis not present

## 2017-07-10 DIAGNOSIS — R2689 Other abnormalities of gait and mobility: Secondary | ICD-10-CM | POA: Diagnosis not present

## 2017-07-10 LAB — CBC
HCT: 40.1 % (ref 36.0–46.0)
Hemoglobin: 13.2 g/dL (ref 12.0–15.0)
MCH: 31 pg (ref 26.0–34.0)
MCHC: 32.9 g/dL (ref 30.0–36.0)
MCV: 94.1 fL (ref 78.0–100.0)
PLATELETS: 250 10*3/uL (ref 150–400)
RBC: 4.26 MIL/uL (ref 3.87–5.11)
RDW: 14.3 % (ref 11.5–15.5)
WBC: 10 10*3/uL (ref 4.0–10.5)

## 2017-07-10 LAB — BASIC METABOLIC PANEL
Anion gap: 12 (ref 5–15)
BUN: 13 mg/dL (ref 6–20)
CHLORIDE: 108 mmol/L (ref 101–111)
CO2: 23 mmol/L (ref 22–32)
Calcium: 9.2 mg/dL (ref 8.9–10.3)
Creatinine, Ser: 1.1 mg/dL — ABNORMAL HIGH (ref 0.44–1.00)
GFR calc Af Amer: 53 mL/min — ABNORMAL LOW (ref >60)
GFR calc non Af Amer: 46 mL/min — ABNORMAL LOW (ref >60)
GLUCOSE: 175 mg/dL — AB (ref 65–99)
Potassium: 3.5 mmol/L (ref 3.5–5.1)
SODIUM: 143 mmol/L (ref 135–145)

## 2017-07-10 NOTE — ED Triage Notes (Signed)
Per Pt, Pt is coming from home with complaints of generalized weakness for several months. Denies any chest pain or SOB. Pt is ambulatory upon arrival. Denies any N/V/D.

## 2017-07-10 NOTE — ED Provider Notes (Signed)
[facility]ENC[facility] Provider Note   CSN: [id number] Arrival date & time: 07/10/17  [id number]     History   Chief Complaint Chief Complaint  Patient presents with  . Weakness    HPI Evelyn Irwin is a 84 y.o. female.  HPI  84 year old female with a history of mild dementia presents with worsening weakness.  History is taken from the son at the bedside.  The patient has been feeling progressively more weak and had some trouble ambulating for several months.  Today it seemed like she had much more difficulty going down the stairs than typical so she was brought in for evaluation.  She follows with Dr. Karel JarvisAquino of neurology.  Patient states that she has some trouble with balance and like her legs will give out from under her.  Also just generalized fatigue and weakness.  No vomiting, fevers, abdominal pain, chest pain, headache, or urinary symptoms.  Has follow-up with her neurologist next week.  Past Medical History:  Diagnosis Date  . ALLERGIC RHINITIS 11/11/2006  . ANXIETY 11/11/2006  . CONSTIPATION 02/23/2008  . GERD 10/06/2007  . HYPERTENSION 11/11/2006  . HYPOKALEMIA, HX OF 02/09/2008  . Memory loss   . OSTEOARTHRITIS 11/11/2006    Patient Active Problem List   Diagnosis Date Noted  . Mild dementia 06/15/2015  . Transient alteration of awareness 10/06/2014  . Acute hypokalemia 01/23/2012  . Syncope and collapse 01/23/2012  . Palpitations 01/23/2012  . CONSTIPATION 02/23/2008  . History of hypokalemia 02/09/2008  . GERD 10/06/2007  . ANXIETY 11/11/2006  . Essential hypertension 11/11/2006  . ALLERGIC RHINITIS 11/11/2006  . Osteoarthritis 11/11/2006    Past Surgical History:  Procedure Laterality Date  . EP IMPLANTABLE DEVICE N/A 03/21/2015   Procedure: Loop Recorder Removal;  Surgeon: Hillis RangeJames Allred, MD;  Location: [facility] INVASIVE CV LAB;  Service: Cardiovascular;  Laterality: N/A;  . LOOP RECORDER IMPLANT N/A 03/30/2014   MDT LINQ implanted by Dr  Johney FrameAllred for syncope  . TOTAL KNEE ARTHROPLASTY     Right    OB History    No data available       Home Medications    Prior to Admission medications   Medication Sig Start Date End Date Taking? Authorizing Provider  amLODipine (NORVASC) 10 MG tablet TAKE 1 TABLET BY MOUTH EVERY DAY 04/28/17   Gordy SaversKwiatkowski, Peter F, MD  cloNIDine (CATAPRES) 0.1 MG tablet TAKE 1 TABLET BY MOUTH EVERYDAY AT BEDTIME 06/10/17   Gordy SaversKwiatkowski, Peter F, MD  donepezil (ARICEPT) 10 MG tablet Take 1 tablet (10 mg total) by mouth at bedtime. 06/30/16   Gordy SaversKwiatkowski, Peter F, MD  memantine (NAMENDA) 10 MG tablet Take 1 tablet at night for 1 month, then increase to 1 tablet twice a day 01/16/17   Van ClinesAquino, Karen M, MD  metoprolol (LOPRESSOR) 50 MG tablet Take 0.5 tablets (25 mg total) by mouth 2 (two) times daily. 09/09/16   Gordy SaversKwiatkowski, Peter F, MD  Misc Natural Products (ADRENAL PO) Take 1 tablet by mouth daily as needed (SINUSES).     [provider]  risperiDONE (RISPERDAL) 0.25 MG tablet TAKE 1 TABLET (0.25 MG TOTAL) BY MOUTH 2 (TWO) TIMES DAILY. 06/03/16   [provider]  telmisartan (MICARDIS) 80 MG tablet TAKE 1 TABLET BY MOUTH EVERY DAY 04/20/17   Gordy SaversKwiatkowski, Peter F, MD    Family History Family History  Problem Relation Age of Onset  . Stroke Mother   . Stroke Father   . Cancer Sister   .  Stroke Brother   . Stroke Brother     Social History Social History   Tobacco Use  . Smoking status: Never Smoker  . Smokeless tobacco: Never Used  Substance Use Topics  . Alcohol use: No    Alcohol/week: 0.0 oz  . Drug use: No     Allergies   Depakote er [divalproex sodium er]   Review of Systems Review of Systems  Constitutional: Negative for fever.  Respiratory: Negative for shortness of breath.   Cardiovascular: Negative for chest pain.  Gastrointestinal: Negative for abdominal pain and vomiting.  Genitourinary: Negative for dysuria.  Musculoskeletal: Positive for gait problem.    Neurological: Positive for weakness. Negative for numbness and headaches.  All other systems reviewed and are negative.    Physical Exam Updated Vital Signs BP (!) 141/90   Pulse 68   Temp 97.9 F (36.6 C) (Oral)   Resp (!) 22   Ht 5' (1.524 m)   Wt 63.5 kg (140 lb)   SpO2 99%   BMI 27.34 kg/m   Physical Exam  Constitutional: She appears well-developed and well-nourished.  HENT:  Head: Normocephalic and atraumatic.  Right Ear: External ear normal.  Left Ear: External ear normal.  Nose: Nose normal.  Eyes: EOM are normal. Pupils are equal, round, and reactive to light. Right eye exhibits no discharge. Left eye exhibits no discharge.  Neck: Neck supple.  Cardiovascular: Normal rate, regular rhythm and normal heart sounds.  Pulmonary/Chest: Effort normal and breath sounds normal.  Abdominal: Soft. There is no tenderness.  Neurological: She is alert. She is disoriented.  Awake, alert but disoriented to time (normal per son). CN 3-12 grossly intact. 5/5 strength in all 4 extremities. Grossly normal sensation. Normal finger to nose. Normal gait  Skin: Skin is warm and dry.  Nursing note and vitals reviewed.    ED Treatments / Results  Labs (all labs ordered are listed, but only abnormal results are displayed) Labs Reviewed  BASIC METABOLIC PANEL - Abnormal; Notable for the following components:      Result Value   Glucose, Bld 175 (*)    Creatinine, Ser 1.10 (*)    GFR calc non Af Amer 46 (*)    GFR calc Af Amer 53 (*)    All other components within normal limits  CBC    EKG  EKG Interpretation  Date/Time:  Friday July 10 2017 11:37:58 EST Ventricular Rate:  68 PR Interval:  144 QRS Duration: 82 QT Interval:  424 QTC Calculation: 450 R Axis:   79 Text Interpretation:  Normal sinus rhythm no acute ST/T changes no significant change since 2018 Confirmed by Pricilla Loveless [id number]) on 07/10/2017 3:04:50 PM       Radiology No results  found.  Procedures Procedures (including critical care time)  Medications Ordered in ED Medications - No data to display   Initial Impression / Assessment and Plan / ED Course  I have reviewed the triage vital signs and the nursing notes.  Pertinent labs & imaging results that were available during my care of the patient were reviewed by me and considered in my medical decision making (see chart for details).     Patient presents with generalized weakness for quite some time.  She is ambulating normally in the ED.  Her labs appear baseline.  Some hypertension although this coming down without treatment.  My suspicion for an acute cardiac or neurologic emergency is quite low.  There is no focal weakness.  I suspect  this is progression of her dementia and possibly related to medicines.  She appears stable for discharge home with follow-up with her neurologist.  She does not have any urinary symptoms and given 2 months of symptoms my suspicion for UTI is quite low and I do not think testing is needed.  Discharge home with return precautions.  Final Clinical Impressions(s) / ED Diagnoses   Final diagnoses:  Generalized weakness    ED Discharge Orders    None       Pricilla Loveless, MD 07/10/17 [phone]

## 2017-07-10 NOTE — ED Notes (Signed)
Patient verbalizes understanding of discharge instructions. Opportunity for questioning and answers were provided. Armband removed by staff, pt discharged from ED ambulatory.   

## 2017-07-16 ENCOUNTER — Other Ambulatory Visit: Payer: Self-pay

## 2017-07-16 ENCOUNTER — Ambulatory Visit: Payer: Medicare HMO | Admitting: Neurology

## 2017-07-16 VITALS — BP 134/70 | HR 69 | Ht 62.0 in | Wt 163.0 lb

## 2017-07-16 DIAGNOSIS — F0390 Unspecified dementia without behavioral disturbance: Secondary | ICD-10-CM

## 2017-07-16 DIAGNOSIS — R69 Illness, unspecified: Secondary | ICD-10-CM | POA: Diagnosis not present

## 2017-07-16 DIAGNOSIS — F03A0 Unspecified dementia, mild, without behavioral disturbance, psychotic disturbance, mood disturbance, and anxiety: Secondary | ICD-10-CM

## 2017-07-16 MED ORDER — DONEPEZIL HCL 10 MG PO TABS: 10.0000 mg | ORAL_TABLET | Freq: Every day | ORAL | 5 refills | Status: DC

## 2017-07-16 MED ORDER — MEMANTINE HCL 10 MG PO TABS: ORAL_TABLET | ORAL | 11 refills | Status: DC

## 2017-07-16 NOTE — Progress Notes (Signed)
NEUROLOGY FOLLOW UP OFFICE NOTE  Buddy Dutyrma Metivier [id number]  DOB: Feb 10, 1934  HISTORY OF PRESENT ILLNESS: I had the pleasure of seeing Buddy Dutyrma Kolenovic in follow-up in the neurology clinic on 07/16/2017. The patient was last seen 6 months ago and is again accompanied by her husband who helps supplement the history today. They deny any full syncopal episodes since July 2017. She was in the ER a week ago for generalized weakness. Her husband shakes his head, he states that she got up in the morning not feeling well, telling him she needed to see the doctor. He took her to the ER  where CBC was normal, BMP showed creatinine of 1.10. He states that she was able to walk out of the ER without any assistance and seemed fine. Around the house she is occasionally unsteady, but was walking fine in the hospital. She occasionally feels difficulty weak. Appetite is good, she does not drink enough fluids per husband. No new medication changes. He reports mood has been good, she has not had any anxiety or paranoia. She is taking Risperdal. [facility], MMSE was 23/30. She is taking Aricept 10mg  daily and Namenda 10mg  BID without side effects. She is independent with dressing and bathing. Her husband is in charge of finances and medications. She does not drive. Sleep is good. She denies any headaches, dizziness, vision changes, focal numbness/tingling/weakness, no falls. I had spoken separately to her 2 daughters last September 2018 and they expressed concern about help at home. The patient and her husband will be visiting [place] in [place] in the summer to see about staying with them.   HPI: This is an 84 yo RH woman with a history of hypertension who presented with recurrent syncopal episodes. The first episode occurred in 2013 when she woke up on the kitchen floor confused. [facility] where workup was unrevealing. Echo had shown basal septal hypertrophy and  dynamic obstruction. She reports had another episode last August 2015 where she woke up on the grass, again feeling confused after. No tongue bite or urinary incontinence. She has chronic tinnitus and reports that the ringing in her ears may have gotten a little louder. She denied feeling dizzy or diaphoretic prior to the episodes. She had an ILR in October 2015. Her head CT was unremarkable. Routine EEG showed occasional left temporal slowing, however her 24-hour EEG did not show similar changes and was within normal limits. She was unable to get an MRI due to loop recorder. She has seen cardiology with normal EF. She had another event last 09/25/14 while she was lying on bed watching a ballgame. She has chronic tinnitus but recalls the tinnitus increased, then when she came to, she was lying on her back and felt very confused. She got up and went to the bathroom and felt she did not recognize anything. She walked through all the bedrooms looking for "that man" (her husband), then her memory started coming back. She saw "that man sitting on the chair," but did not speak to him and went back to the room. When he came to find her 20 minutes later, her symptoms had cleared but she did not feel "quite right." No tongue bite or incontinence. She denied any associated headache but thinks she felt this episode coming on a few days prior because she was having increased ringing in her ears for 2 days in a row (no associated confusion). Loop recorder  did not indicate any cardiac abnormalities.  She and her husband recall a curious episode in 2010 or 2011 when she suddenly felt sleepy and fell across the bed. When she got up she did not know where and who she was, did not recognize their kitchen. She went back to sleep and woke up fine.   She feels that since the syncopal episode in August 2015, she does not remember things like she used to. They were on a cruise last November, and she forgot they were onboard a ship. She  would move things around in their house and they cannot find them. She has only missed 1 bill payment, denies missing medications. No family history of similar symptoms.   Diagnostic Data: Head CT which did not show any acute changes. Her routine EEG had shown non-specific slowing in the left temporal region, a 24-hour EEG did not show similar findings, and was within normal limits.   PAST MEDICAL HISTORY: Past Medical History:  Diagnosis Date  . ALLERGIC RHINITIS 11/11/2006  . ANXIETY 11/11/2006  . CONSTIPATION 02/23/2008  . GERD 10/06/2007  . HYPERTENSION 11/11/2006  . HYPOKALEMIA, HX OF 02/09/2008  . Memory loss   . OSTEOARTHRITIS 11/11/2006    MEDICATIONS:  Outpatient Encounter Medications as of 07/16/2017  Medication Sig  . amLODipine (NORVASC) 10 MG tablet TAKE 1 TABLET BY MOUTH EVERY DAY  . cloNIDine (CATAPRES) 0.1 MG tablet TAKE 1 TABLET BY MOUTH EVERYDAY AT BEDTIME  . donepezil (ARICEPT) 10 MG tablet Take 1 tablet (10 mg total) by mouth at bedtime.  . memantine (NAMENDA) 10 MG tablet Take 1 tablet at night for 1 month, then increase to 1 tablet twice a day  . metoprolol (LOPRESSOR) 50 MG tablet Take 0.5 tablets (25 mg total) by mouth 2 (two) times daily.  . Misc Natural Products (ADRENAL PO) Take 1 tablet by mouth daily as needed (SINUSES).   . risperiDONE (RISPERDAL) 0.25 MG tablet TAKE 1 TABLET (0.25 MG TOTAL) BY MOUTH 2 (TWO) TIMES DAILY.  Marland Kitchen telmisartan (MICARDIS) 80 MG tablet TAKE 1 TABLET BY MOUTH EVERY DAY   No facility-administered encounter medications on file as of 07/16/2017.     ALLERGIES: Allergies  Allergen Reactions  . Depakote Er [Divalproex Sodium Er] Hives    FAMILY HISTORY: Family History  Problem Relation Age of Onset  . Stroke Mother   . Stroke Father   . Cancer Sister   . Stroke Brother   . Stroke Brother     SOCIAL HISTORY: Social History   Socioeconomic History  . Marital status: Married    Spouse name: Not on file  . Number of children:  Not on file  . Years of education: Not on file  . Highest education level: Not on file  Social Needs  . Financial resource strain: Not on file  . Food insecurity - worry: Not on file  . Food insecurity - inability: Not on file  . Transportation needs - medical: Not on file  . Transportation needs - non-medical: Not on file  Occupational History  . Not on file  Tobacco Use  . Smoking status: Never Smoker  . Smokeless tobacco: Never Used  Substance and Sexual Activity  . Alcohol use: No    Alcohol/week: 0.0 oz  . Drug use: No  . Sexual activity: Not on file  Other Topics Concern  . Not on file  Social History Narrative  . Not on file    REVIEW OF SYSTEMS: Constitutional: No fevers, chills,  or sweats, no generalized fatigue, change in appetite Eyes: No visual changes, double vision, eye pain Ear, nose and throat: No hearing loss, ear pain, nasal congestion, sore throat Cardiovascular: No chest pain, palpitations Respiratory:  No shortness of breath at rest or with exertion, wheezes GastrointestinaI: No nausea, vomiting, diarrhea, abdominal pain, fecal incontinence Genitourinary:  No dysuria, urinary retention or frequency Musculoskeletal:  No neck pain, back pain Integumentary: No rash, pruritus, skin lesions Neurological: as above Psychiatric: No depression, insomnia, anxiety Endocrine: No palpitations, fatigue, diaphoresis, mood swings, change in appetite, change in weight, increased thirst Hematologic/Lymphatic:  No anemia, purpura, petechiae. Allergic/Immunologic: no itchy/runny eyes, nasal congestion, recent allergic reactions, rashes  PHYSICAL EXAM: Vitals:   07/16/17 1127  BP: 134/70  Pulse: 69  SpO2: 97%   General: No acute distress Head:  Normocephalic/atraumatic Neck: supple, no paraspinal tenderness, full range of motion Heart:  Regular rate and rhythm Lungs:  Clear to auscultation bilaterally Back: No paraspinal tenderness Skin/Extremities: No rash, no  edema Neurological Exam: alert and oriented to person, place. No aphasia or dysarthria. Fund of knowledge is appropriate.  Remote memory intact. 0/3 delayed recall. Attention and concentration are normal.    Able to name objects and repeat phrases. Cranial nerves: Pupils equal, round, reactive to light. Extraocular movements intact with no nystagmus. Visual fields full. Facial sensation intact. No facial asymmetry. Tongue, uvula, palate midline.  Motor: Bulk and tone normal, muscle strength 5/5 throughout with no pronator drift.  Sensation to light touch intact.  No extinction to double simultaneous stimulation.  Deep tendon reflexes 1+ throughout, toes downgoing.  Finger to nose testing intact.  Gait narrow-based and steady, slight difficulty with tandem walk but able (similar to prior).  Romberg negative.  IMPRESSION: This is an 84 yo RH woman with a history of hypertension, recurrent syncope of unclear etiology, and mild dementia. No clear epilepsy risk factors, neurological exam is normal. Her head CT was unremarkable. Routine EEG had shown occasional left temporal slowing, however her 24-hour EEG did not show similar changes, typical events were not captured. Cardiac workup unrevealing, with no arrhythmia noted on loop monitor during an event. She has been evaluated at [facility], concern for confusional episodes due to neurodegenerative disorder. No further episodes since July 2017. Neurocognitive testing at [facility] showing dementia per reports. MMSE at [facility] in November 2018 was 23/30, continue Aricept and Namenda, refills sent. She reported generalized weakness a week ago, but her husband states she was walking fine in the ER, these may be behavioral changes due to underlying dementia. No focal weakness noted today. No further hallucinations on Risperdal. We had previously discussed she may benefit from an SSRI, her husband would like to hold off. We again discussed home safety and needing more help at home. We  again discussed the importance of control of vascular risk factors, physical exercise, and brain stimulation exercises for brain health. She will follow-up in 6 months and knows to call for any changes.   Thank you for allowing me to participate in her care.  Please do not hesitate to call for any questions or concerns.  The duration of this appointment visit was 25 minutes of face-to-face time with the patient.  Greater than 50% of this time was spent in counseling, explanation of diagnosis, planning of further management, and coordination of care.   Patrcia Dolly, M.D.   CC: Dr. Amador Cunas

## 2017-07-16 NOTE — Patient Instructions (Addendum)
1. Continue all your medications 2. Follow-up in 6 months, call for any changes  FALL PRECAUTIONS: Be cautious when walking. Scan the area for obstacles that may increase the risk of trips and falls. When getting up in the mornings, sit up at the edge of the bed for a few minutes before getting out of bed. Consider elevating the bed at the head end to avoid drop of blood pressure when getting up. Walk always in a well-lit room (use night lights in the walls). Avoid area rugs or power cords from appliances in the middle of the walkways. Use a walker or a cane if necessary and consider physical therapy for balance exercise. Get your eyesight checked regularly.  FINANCIAL OVERSIGHT: Supervision, especially oversight when making financial decisions or transactions is also recommended.  HOME SAFETY: Consider the safety of the kitchen when operating appliances like stoves, microwave oven, and blender. Consider having supervision and share cooking responsibilities until no longer able to participate in those. Accidents with firearms and other hazards in the house should be identified and addressed as well.  DRIVING: Regarding driving, in patients with progressive memory problems, driving will be impaired. We advise to have someone else do the driving if trouble finding directions or if minor accidents are reported. Independent driving assessment is available to determine safety of driving.  ABILITY TO BE LEFT ALONE: If patient is unable to contact 911 operator, consider using LifeLine, or when the need is there, arrange for someone to stay with patients. Smoking is a fire hazard, consider supervision or cessation. Risk of wandering should be assessed by caregiver and if detected at any point, supervision and safe proof recommendations should be instituted.  MEDICATION SUPERVISION: Inability to self-administer medication needs to be constantly addressed. Implement a mechanism to ensure safe administration of  the medications.  RECOMMENDATIONS FOR ALL PATIENTS WITH MEMORY PROBLEMS: 1. Continue to exercise (Recommend 30 minutes of walking everyday, or 3 hours every week) 2. Increase social interactions - continue going to [place] and enjoy social gatherings with friends and family 3. Eat healthy, avoid fried foods and eat more fruits and vegetables 4. Maintain adequate blood pressure, blood sugar, and blood cholesterol level. Reducing the risk of stroke and cardiovascular disease also helps promoting better memory. 5. Avoid stressful situations. Live a simple life and avoid aggravations. Organize your time and prepare for the next day in anticipation. 6. Sleep well, avoid any interruptions of sleep and avoid any distractions in the bedroom that may interfere with adequate sleep quality 7. Avoid sugar, avoid sweets as there is a strong link between excessive sugar intake, diabetes, and cognitive impairment We discussed the Mediterranean diet, which has been shown to help patients reduce the risk of progressive memory disorders and reduces cardiovascular risk. This includes eating fish, eat fruits and green leafy vegetables, nuts like almonds and hazelnuts, walnuts, and also use olive oil. Avoid fast foods and fried foods as much as possible. Avoid sweets and sugar as sugar use has been linked to worsening of memory function.  There is always a concern of gradual progression of memory problems. If this is the case, then we may need to adjust level of care according to patient needs. Support, both to the patient and caregiver, should then be put into place.

## 2017-07-17 ENCOUNTER — Encounter: Payer: Self-pay | Admitting: Neurology

## 2017-07-24 ENCOUNTER — Other Ambulatory Visit: Payer: Self-pay | Admitting: Internal Medicine

## 2017-07-30 ENCOUNTER — Other Ambulatory Visit: Payer: Self-pay | Admitting: Internal Medicine

## 2017-10-23 ENCOUNTER — Other Ambulatory Visit: Payer: Self-pay | Admitting: Internal Medicine

## 2017-11-01 ENCOUNTER — Other Ambulatory Visit: Payer: Self-pay | Admitting: Internal Medicine

## 2017-11-05 ENCOUNTER — Other Ambulatory Visit: Payer: Self-pay | Admitting: Internal Medicine

## 2017-11-27 ENCOUNTER — Other Ambulatory Visit: Payer: Self-pay | Admitting: Internal Medicine

## 2017-12-15 ENCOUNTER — Ambulatory Visit: Payer: Medicare HMO | Admitting: Internal Medicine

## 2017-12-15 ENCOUNTER — Encounter: Payer: Self-pay | Admitting: Internal Medicine

## 2017-12-15 VITALS — BP 130/68 | HR 79 | Temp 98.6°F | Wt 160.0 lb

## 2017-12-15 DIAGNOSIS — R7302 Impaired glucose tolerance (oral): Secondary | ICD-10-CM | POA: Diagnosis not present

## 2017-12-15 DIAGNOSIS — I10 Essential (primary) hypertension: Secondary | ICD-10-CM

## 2017-12-15 DIAGNOSIS — F03A0 Unspecified dementia, mild, without behavioral disturbance, psychotic disturbance, mood disturbance, and anxiety: Secondary | ICD-10-CM

## 2017-12-15 DIAGNOSIS — Z8639 Personal history of other endocrine, nutritional and metabolic disease: Secondary | ICD-10-CM | POA: Diagnosis not present

## 2017-12-15 DIAGNOSIS — R69 Illness, unspecified: Secondary | ICD-10-CM | POA: Diagnosis not present

## 2017-12-15 DIAGNOSIS — F0390 Unspecified dementia without behavioral disturbance: Secondary | ICD-10-CM

## 2017-12-15 DIAGNOSIS — M150 Primary generalized (osteo)arthritis: Secondary | ICD-10-CM | POA: Diagnosis not present

## 2017-12-15 DIAGNOSIS — M159 Polyosteoarthritis, unspecified: Secondary | ICD-10-CM

## 2017-12-15 LAB — POCT GLYCOSYLATED HEMOGLOBIN (HGB A1C): HEMOGLOBIN A1C: 5.8 % — AB (ref 4.0–5.6)

## 2017-12-15 NOTE — Progress Notes (Signed)
Subjective:    Patient ID: Collette Pescador, female    DOB: 08/15/1933, 84 y.o.   MRN: [id number]  HPI  84 year old patient who is seen today for her six-month follow-up.  She has a history of essential hypertension and has been followed by neurology with a history of dementia.  She also has a history of syncope which has been stable since her last visit here.  She is accompanied by her husband and has no concerns or complaints. Laboratory studies were done earlier in the year revealed a random blood sugar of 175  Past Medical History:  Diagnosis Date  . ALLERGIC RHINITIS 11/11/2006  . ANXIETY 11/11/2006  . CONSTIPATION 02/23/2008  . GERD 10/06/2007  . HYPERTENSION 11/11/2006  . HYPOKALEMIA, HX OF 02/09/2008  . Memory loss   . OSTEOARTHRITIS 11/11/2006     Social History   Socioeconomic History  . Marital status: Married    Spouse name: Not on file  . Number of children: Not on file  . Years of education: Not on file  . Highest education level: Not on file  Occupational History  . Not on file  Social Needs  . Financial resource strain: Not on file  . Food insecurity:    Worry: Not on file    Inability: Not on file  . Transportation needs:    Medical: Not on file    Non-medical: Not on file  Tobacco Use  . Smoking status: Never Smoker  . Smokeless tobacco: Never Used  Substance and Sexual Activity  . Alcohol use: No    Alcohol/week: 0.0 oz  . Drug use: No  . Sexual activity: Not on file  Lifestyle  . Physical activity:    Days per week: Not on file    Minutes per session: Not on file  . Stress: Not on file  Relationships  . Social connections:    Talks on phone: Not on file    Gets together: Not on file    Attends religious service: Not on file    Active member of club or organization: Not on file    Attends meetings of clubs or organizations: Not on file    Relationship status: Not on file  . Intimate partner violence:    Fear of current or ex partner: Not on file      Emotionally abused: Not on file    Physically abused: Not on file    Forced sexual activity: Not on file  Other Topics Concern  . Not on file  Social History Narrative  . Not on file    Past Surgical History:  Procedure Laterality Date  . EP IMPLANTABLE DEVICE N/A 03/21/2015   Procedure: Loop Recorder Removal;  Surgeon: Hillis Range, MD;  Location: [facility];  Service: Cardiovascular;  Laterality: N/A;  . LOOP RECORDER IMPLANT N/A 03/30/2014   MDT LINQ implanted by Dr Johney Frame for syncope  . TOTAL KNEE ARTHROPLASTY     Right    Family History  Problem Relation Age of Onset  . Stroke Mother   . Stroke Father   . Cancer Sister   . Stroke Brother   . Stroke Brother     Allergies  Allergen Reactions  . Depakote Er [Divalproex Sodium Er] Hives    Current Outpatient Medications on File Prior to Visit  Medication Sig Dispense Refill  . amLODipine (NORVASC) 10 MG tablet TAKE 1 TABLET BY MOUTH EVERY DAY 90 tablet 0  . cloNIDine (CATAPRES) 0.1 MG tablet  TAKE 1 TABLET BY MOUTH EVERYDAY AT BEDTIME 90 tablet 1  . donepezil (ARICEPT) 10 MG tablet Take 1 tablet (10 mg total) by mouth at bedtime. 90 tablet 5  . memantine (NAMENDA) 10 MG tablet Take 1 tablet at night for 1 month, then increase to 1 tablet twice a day 60 tablet 11  . metoprolol tartrate (LOPRESSOR) 50 MG tablet TAKE 1/2 TABLET BY MOUTH TWICE A DAY 180 tablet 0  . Misc Natural Products (ADRENAL PO) Take 1 tablet by mouth daily as needed (SINUSES).     . risperiDONE (RISPERDAL) 0.25 MG tablet TAKE 1 TABLET (0.25 MG TOTAL) BY MOUTH 2 (TWO) TIMES DAILY.  5  . telmisartan (MICARDIS) 80 MG tablet TAKE 1 TABLET BY MOUTH EVERY DAY 90 tablet 0   No current facility-administered medications on file prior to visit.     BP 130/68 (BP Location: Right Arm, Patient Position: Sitting, Cuff Size: Large)   Pulse 79   Temp 98.6 F (37 C) (Oral)   Wt 160 lb (72.6 kg)   SpO2 96%   BMI 29.26 kg/m   \  Review of Systems   Constitutional: Negative.   HENT: Negative for congestion, dental problem, hearing loss, rhinorrhea, sinus pressure, sore throat and tinnitus.   Eyes: Negative for pain, discharge and visual disturbance.  Respiratory: Negative for cough and shortness of breath.   Cardiovascular: Negative for chest pain, palpitations and leg swelling.  Gastrointestinal: Negative for abdominal distention, abdominal pain, blood in stool, constipation, diarrhea, nausea and vomiting.  Genitourinary: Negative for difficulty urinating, dysuria, flank pain, frequency, hematuria, pelvic pain, urgency, vaginal bleeding, vaginal discharge and vaginal pain.  Musculoskeletal: Negative for arthralgias, gait problem and joint swelling.  Skin: Negative for rash.  Neurological: Negative for dizziness, syncope, speech difficulty, weakness, numbness and headaches.  Hematological: Negative for adenopathy.  Psychiatric/Behavioral: Positive for confusion and decreased concentration. Negative for agitation, behavioral problems and dysphoric mood. The patient is not nervous/anxious.        Objective:   Physical Exam  Constitutional: She is oriented to person, place, and time. She appears well-developed and well-nourished.  Blood pressure 130/64  HENT:  Head: Normocephalic.  Right Ear: External ear normal.  Left Ear: External ear normal.  Mouth/Throat: Oropharynx is clear and moist.  Eyes: Pupils are equal, round, and reactive to light. Conjunctivae and EOM are normal.  Neck: Normal range of motion. Neck supple. No thyromegaly present.  Cardiovascular: Normal rate, regular rhythm, normal heart sounds and intact distal pulses.  Pulmonary/Chest: Effort normal and breath sounds normal.  Abdominal: Soft. Bowel sounds are normal. She exhibits no mass. There is no tenderness.  Musculoskeletal: Normal range of motion.  Lymphadenopathy:    She has no cervical adenopathy.  Neurological: She is alert and oriented to person, place,  and time.  Skin: Skin is warm and dry. No rash noted.  Psychiatric: She has a normal mood and affect. Her behavior is normal.          Assessment & Plan:   Essential hypertension well-controlled Dementia.  Continue present regimen and neurology follow-up Impaired glucose tolerance.  Will check a hemoglobin A1c  Return in 6 months for annual exam and to establish with a new provider medications updated   Gordy Saverseter F Zayan Delvecchio

## 2017-12-15 NOTE — Patient Instructions (Signed)
Limit your sodium (Salt) intake  Please check your blood pressure on a regular basis.  If it is consistently greater than 140/90, please make an office appointment.    It is important that you exercise regularly, at least 20 minutes 3 to 4 times per week.  If you develop chest pain or shortness of breath seek  medical attention.  Neurology follow-up as scheduled  Return in 6 months for your annual preventive health examination

## 2018-01-14 DIAGNOSIS — E663 Overweight: Secondary | ICD-10-CM | POA: Diagnosis not present

## 2018-01-14 DIAGNOSIS — J309 Allergic rhinitis, unspecified: Secondary | ICD-10-CM | POA: Diagnosis not present

## 2018-01-14 DIAGNOSIS — R69 Illness, unspecified: Secondary | ICD-10-CM | POA: Diagnosis not present

## 2018-01-14 DIAGNOSIS — Z823 Family history of stroke: Secondary | ICD-10-CM | POA: Diagnosis not present

## 2018-01-14 DIAGNOSIS — Z6828 Body mass index (BMI) 28.0-28.9, adult: Secondary | ICD-10-CM | POA: Diagnosis not present

## 2018-01-14 DIAGNOSIS — G309 Alzheimer's disease, unspecified: Secondary | ICD-10-CM | POA: Diagnosis not present

## 2018-01-14 DIAGNOSIS — I10 Essential (primary) hypertension: Secondary | ICD-10-CM | POA: Diagnosis not present

## 2018-01-14 DIAGNOSIS — Z9181 History of falling: Secondary | ICD-10-CM | POA: Diagnosis not present

## 2018-01-18 ENCOUNTER — Other Ambulatory Visit: Payer: Self-pay | Admitting: Internal Medicine

## 2018-01-20 ENCOUNTER — Ambulatory Visit: Payer: Medicare HMO | Admitting: Neurology

## 2018-01-20 ENCOUNTER — Other Ambulatory Visit: Payer: Self-pay

## 2018-01-20 ENCOUNTER — Encounter: Payer: Self-pay | Admitting: Neurology

## 2018-01-20 ENCOUNTER — Encounter

## 2018-01-20 VITALS — BP 140/76 | HR 74 | Wt 158.0 lb

## 2018-01-20 DIAGNOSIS — R69 Illness, unspecified: Secondary | ICD-10-CM | POA: Diagnosis not present

## 2018-01-20 DIAGNOSIS — F0391 Unspecified dementia with behavioral disturbance: Secondary | ICD-10-CM

## 2018-01-20 DIAGNOSIS — F03B18 Unspecified dementia, moderate, with other behavioral disturbance: Secondary | ICD-10-CM

## 2018-01-20 MED ORDER — RISPERIDONE 0.25 MG PO TABS: ORAL_TABLET | ORAL | 11 refills | Status: DC

## 2018-01-20 MED ORDER — ESCITALOPRAM OXALATE 5 MG PO TABS: 5.0000 mg | ORAL_TABLET | Freq: Every day | ORAL | 11 refills | Status: DC

## 2018-01-20 MED ORDER — MEMANTINE HCL 10 MG PO TABS: ORAL_TABLET | ORAL | 11 refills | Status: AC

## 2018-01-20 MED ORDER — DONEPEZIL HCL 10 MG PO TABS: 10.0000 mg | ORAL_TABLET | Freq: Every day | ORAL | 5 refills | Status: AC

## 2018-01-20 NOTE — Progress Notes (Signed)
NEUROLOGY FOLLOW UP OFFICE NOTE  Evelyn Irwin [id number]  DOB: 1934/03/17  HISTORY OF PRESENT ILLNESS: I had the pleasure of seeing Evelyn Irwin in follow-up in the neurology clinic on 01/20/2018. The patient was last seen 6 months ago and is again accompanied by her husband who helps supplement the history today. Her husband denies any syncopal episodes since July 2017. Her husband now feels that these episodes were occurring because she was managing her own BP medications and she was taking more than prescribed. Since he has taken over the medications, she has not had any further episodes. He reports progressive worsening in cognition and behavior. She is on Aricept 10mg  daily and Namenda 10mg  BID without side effects. He reports she is not sleeping well, she states she sleeps fine. She wakes up multiple times at night and asks if she had supper yet. This is not every night, but he reports an average of 4 hours of sleep some nights. No wandering behavior. She naps quite a lot during the daytime. She does not want to go out. She now needs help with dressing and bathing, although she states she does it by herself. Her husband manages finances, medications, and meals. They drove to [place] last month and she was more confused, but talking less.No paranoia or hallucinations since starting Risperdal 0.25mg  BID.  HPI: This is an 84 yo RH woman with a history of hypertension who presented with recurrent syncopal episodes. The first episode occurred in 2013 when she woke up on the kitchen floor confused. She was admitted to [facility] where workup was unrevealing. Echo had shown basal septal hypertrophy and dynamic obstruction. She reports had another episode last August 2015 where she woke up on the grass, again feeling confused after. No tongue bite or urinary incontinence. She has chronic tinnitus and reports that the ringing in her ears may have gotten a little louder. She denied feeling dizzy or diaphoretic  prior to the episodes. She had an ILR in October 2015. Her head CT was unremarkable. Routine EEG showed occasional left temporal slowing, however her 24-hour EEG did not show similar changes and was within normal limits. She was unable to get an MRI due to loop recorder. She has seen cardiology with normal EF. She had another event last 09/25/14 while she was lying on bed watching a ballgame. She has chronic tinnitus but recalls the tinnitus increased, then when she came to, she was lying on her back and felt very confused. She got up and went to the bathroom and felt she did not recognize anything. She walked through all the bedrooms looking for "that man" (her husband), then her memory started coming back. She saw "that man sitting on the chair," but did not speak to him and went back to the room. When he came to find her 20 minutes later, her symptoms had cleared but she did not feel "quite right." No tongue bite or incontinence. She denied any associated headache but thinks she felt this episode coming on a few days prior because she was having increased ringing in her ears for 2 days in a row (no associated confusion). Loop recorder did not indicate any cardiac abnormalities.  She and her husband recall a curious episode in 2010 or 2011 when she suddenly felt sleepy and fell across the bed. When she got up she did not know where and who she was, did not recognize their kitchen. She went back to sleep and woke up fine.   She  feels that since the syncopal episode in August 2015, she does not remember things like she used to. They were on a cruise last November, and she forgot they were onboard a ship. She would move things around in their house and they cannot find them. She has only missed 1 bill payment, denies missing medications. No family history of similar symptoms.   Diagnostic Data: Head CT which did not show any acute changes. Her routine EEG had shown non-specific slowing in the left temporal  region, a 24-hour EEG did not show similar findings, and was within normal limits.   PAST MEDICAL HISTORY: Past Medical History:  Diagnosis Date  . ALLERGIC RHINITIS 11/11/2006  . ANXIETY 11/11/2006  . CONSTIPATION 02/23/2008  . GERD 10/06/2007  . HYPERTENSION 11/11/2006  . HYPOKALEMIA, HX OF 02/09/2008  . Memory loss   . OSTEOARTHRITIS 11/11/2006    MEDICATIONS:  Outpatient Encounter Medications as of 01/20/2018  Medication Sig  . amLODipine (NORVASC) 10 MG tablet TAKE 1 TABLET BY MOUTH EVERY DAY  . cloNIDine (CATAPRES) 0.1 MG tablet TAKE 1 TABLET BY MOUTH EVERYDAY AT BEDTIME  . donepezil (ARICEPT) 10 MG tablet Take 1 tablet (10 mg total) by mouth at bedtime.  . memantine (NAMENDA) 10 MG tablet Take 1 tablet at night for 1 month, then increase to 1 tablet twice a day  . metoprolol tartrate (LOPRESSOR) 50 MG tablet TAKE 1/2 TABLET BY MOUTH TWICE A DAY  . Misc Natural Products (ADRENAL PO) Take 1 tablet by mouth daily as needed (SINUSES).   . risperiDONE (RISPERDAL) 0.25 MG tablet TAKE 1 TABLET (0.25 MG TOTAL) BY MOUTH 2 (TWO) TIMES DAILY.  Marland Kitchen. telmisartan (MICARDIS) 80 MG tablet TAKE 1 TABLET BY MOUTH EVERY DAY   No facility-administered encounter medications on file as of 01/20/2018.     ALLERGIES: Allergies  Allergen Reactions  . Depakote Er [Divalproex Sodium Er] Hives    FAMILY HISTORY: Family History  Problem Relation Age of Onset  . Stroke Mother   . Stroke Father   . Cancer Sister   . Stroke Brother   . Stroke Brother     SOCIAL HISTORY: Social History   Socioeconomic History  . Marital status: Married    Spouse name: Not on file  . Number of children: Not on file  . Years of education: Not on file  . Highest education level: Not on file  Occupational History  . Not on file  Social Needs  . Financial resource strain: Not on file  . Food insecurity:    Worry: Not on file    Inability: Not on file  . Transportation needs:    Medical: Not on file     Non-medical: Not on file  Tobacco Use  . Smoking status: Never Smoker  . Smokeless tobacco: Never Used  Substance and Sexual Activity  . Alcohol use: No    Alcohol/week: 0.0 standard drinks  . Drug use: No  . Sexual activity: Not on file  Lifestyle  . Physical activity:    Days per week: Not on file    Minutes per session: Not on file  . Stress: Not on file  Relationships  . Social connections:    Talks on phone: Not on file    Gets together: Not on file    Attends religious service: Not on file    Active member of club or organization: Not on file    Attends meetings of clubs or organizations: Not on file  Relationship status: Not on file  . Intimate partner violence:    Fear of current or ex partner: Not on file    Emotionally abused: Not on file    Physically abused: Not on file    Forced sexual activity: Not on file  Other Topics Concern  . Not on file  Social History Narrative  . Not on file    REVIEW OF SYSTEMS: Constitutional: No fevers, chills, or sweats, no generalized fatigue, change in appetite Eyes: No visual changes, double vision, eye pain Ear, nose and throat: No hearing loss, ear pain, nasal congestion, sore throat Cardiovascular: No chest pain, palpitations Respiratory:  No shortness of breath at rest or with exertion, wheezes GastrointestinaI: No nausea, vomiting, diarrhea, abdominal pain, fecal incontinence Genitourinary:  No dysuria, urinary retention or frequency Musculoskeletal:  No neck pain, back pain Integumentary: No rash, pruritus, skin lesions Neurological: as above Psychiatric: No depression, insomnia, anxiety Endocrine: No palpitations, fatigue, diaphoresis, mood swings, change in appetite, change in weight, increased thirst Hematologic/Lymphatic:  No anemia, purpura, petechiae. Allergic/Immunologic: no itchy/runny eyes, nasal congestion, recent allergic reactions, rashes  PHYSICAL EXAM: Vitals:   01/20/18 1005  BP: 140/76  Pulse:  74  SpO2: 97%   General: No acute distress, flat affect Head:  Normocephalic/atraumatic Neck: supple, no paraspinal tenderness, full range of motion Heart:  Regular rate and rhythm Lungs:  Clear to auscultation bilaterally Back: No paraspinal tenderness Skin/Extremities: No rash, no edema Neurological Exam: alert and oriented to person, place, season. No aphasia or dysarthria. Fund of knowledge is reduced.  Recent and remote memory impaired. Attention and concentration are reduced.  Able to name objects and repeat phrases. CDT 0/5 MMSE - Mini Mental State Exam 01/20/2018 01/16/2017 10/01/2016  Orientation to time 1 2 3   Orientation to Place 4 4 3   Registration 3 3 3   Attention/ Calculation 2 5 5   Recall 0 0 0  Language- name 2 objects 2 2 2   Language- repeat 1 1 1   Language- follow 3 step command 3 3 3   Language- read & follow direction 1 1 1   Write a sentence 1 1 1   Copy design 0 1 1  Total score 18 23 23    Cranial nerves: Pupils equal, round, reactive to light. Extraocular movements intact with no nystagmus. Visual fields full. Facial sensation intact. No facial asymmetry. Tongue, uvula, palate midline.  Motor: Bulk and tone normal, muscle strength 5/5 throughout with no pronator drift.  Sensation to light touch intact.  No extinction to double simultaneous stimulation.  Deep tendon reflexes 1+ throughout, toes downgoing.  Finger to nose testing intact.  Gait narrow-based and steady, slight difficulty with tandem walk but able (similar to prior).  Romberg negative.  IMPRESSION: This is an 84 yo RH woman with a history of hypertension, recurrent syncope of unclear etiology, and moderate dementia, likely Alzheimer's type. She has not had any further syncopal episodes since July 2017, her husband now feels it was due to taking too much BP medication. He now manages her medications. He reports progressive worsening of dementia, MMSE today 18/30 (23/30 in November 2018). Continue Aricept and  Namenda. We discussed sleep issues and behavioral changes (lack of motivation, increased sleep) which can be signs of depression, and agreed to start low dose Lexapro 5mg  qhs. Side effects discussed. She is on Risperdal 0.25mg  BID for hallucinations, which have quieted down, continue for now, we may cut down AM dose to help with daytime drowsiness. We again discussed higher level of  care or looking into getting more help at home and day programs. We again discussed home safety and needing more help at home. We again discussed the importance of control of vascular risk factors, physical exercise, and brain stimulation exercises for brain health. She will follow-up in 6 months and knows to call for any changes.   Thank you for allowing me to participate in her care.  Please do not hesitate to call for any questions or concerns.  The duration of this appointment visit was 30 minutes of face-to-face time with the patient.  Greater than 50% of this time was spent in counseling, explanation of diagnosis, planning of further management, and coordination of care.   Patrcia Dolly, M.D.   CC: Dr. Amador Cunas

## 2018-01-20 NOTE — Patient Instructions (Signed)
1. Start Lexapro (escitalopram) 5mg : take 1 tablet every night 2. Continue all your other medications 3. Start looking into getting more help at home and looking into adult day programs 4. Follow-up in 6 months or so, call for any changes  Community Programs: 1. Senior Line: [phone]/ Get resource to get information on any and all community programs for [employer]  Community solutions; "Aging Gracefully In Place" program; can request or apply  [place]: [phone] [facility] [phone] Public Health Dept; Need to be a skilled visit but can assist with bathing as well; [phone]   Adult center for Enrichment;  Call Senior Line; [phone]  Adult day services include Adult Day Care, Adult Day Healthcare, Group Respite, Care Partners, Volunteer In [employer] Respite, Education and Support Program   2. [facility]: [phone]  FALL PRECAUTIONS: Be cautious when walking. Scan the area for obstacles that may increase the risk of trips and falls. When getting up in the mornings, sit up at the edge of the bed for a few minutes before getting out of bed. Consider elevating the bed at the head end to avoid drop of blood pressure when getting up. Walk always in a well-lit room (use night lights in the walls). Avoid area rugs or power cords from appliances in the middle of the walkways. Use a walker or a cane if necessary and consider physical therapy for balance exercise. Get your eyesight checked regularly.  HOME SAFETY: Consider the safety of the kitchen when operating appliances like stoves, microwave oven, and blender. Consider having supervision and share cooking responsibilities until no longer able to participate in those. Accidents with firearms and other hazards in the house should be identified and addressed as well.  ABILITY TO BE LEFT ALONE: If patient is unable to contact 911 operator, consider using LifeLine, or when the need is there, arrange for  someone to stay with patients. Smoking is a fire hazard, consider supervision or cessation. Risk of wandering should be assessed by caregiver and if detected at any point, supervision and safe proof recommendations should be instituted.  RECOMMENDATIONS FOR ALL PATIENTS WITH MEMORY PROBLEMS: 1. Continue to exercise (Recommend 30 minutes of walking everyday, or 3 hours every week) 2. Increase social interactions - continue going to [place] and enjoy social gatherings with friends and family 3. Eat healthy, avoid fried foods and eat more fruits and vegetables 4. Maintain adequate blood pressure, blood sugar, and blood cholesterol level. Reducing the risk of stroke and cardiovascular disease also helps promoting better memory. 5. Avoid stressful situations. Live a simple life and avoid aggravations. Organize your time and prepare for the next day in anticipation. 6. Sleep well, avoid any interruptions of sleep and avoid any distractions in the bedroom that may interfere with adequate sleep quality 7. Avoid sugar, avoid sweets as there is a strong link between excessive sugar intake, diabetes, and cognitive impairment The Mediterranean diet has been shown to help patients reduce the risk of progressive memory disorders and reduces cardiovascular risk. This includes eating fish, eat fruits and green leafy vegetables, nuts like almonds and hazelnuts, walnuts, and also use olive oil. Avoid fast foods and fried foods as much as possible. Avoid sweets and sugar as sugar use has been linked to worsening of memory function.  There is always a concern of gradual progression of memory problems. If this is the case, then we may need to adjust level of care according to patient needs. Support, both to the  patient and caregiver, should then be put into place.

## 2018-01-25 ENCOUNTER — Other Ambulatory Visit: Payer: Self-pay | Admitting: Internal Medicine

## 2018-02-10 ENCOUNTER — Ambulatory Visit: Payer: Medicare HMO | Admitting: Adult Health

## 2018-02-10 ENCOUNTER — Encounter: Payer: Self-pay | Admitting: Adult Health

## 2018-02-10 VITALS — BP 156/70 | Temp 98.3°F | Wt 160.0 lb

## 2018-02-10 DIAGNOSIS — F03A0 Unspecified dementia, mild, without behavioral disturbance, psychotic disturbance, mood disturbance, and anxiety: Secondary | ICD-10-CM

## 2018-02-10 DIAGNOSIS — Z7689 Persons encountering health services in other specified circumstances: Secondary | ICD-10-CM | POA: Diagnosis not present

## 2018-02-10 DIAGNOSIS — Z23 Encounter for immunization: Secondary | ICD-10-CM | POA: Diagnosis not present

## 2018-02-10 DIAGNOSIS — F411 Generalized anxiety disorder: Secondary | ICD-10-CM

## 2018-02-10 DIAGNOSIS — F0390 Unspecified dementia without behavioral disturbance: Secondary | ICD-10-CM

## 2018-02-10 DIAGNOSIS — I10 Essential (primary) hypertension: Secondary | ICD-10-CM | POA: Diagnosis not present

## 2018-02-10 DIAGNOSIS — R69 Illness, unspecified: Secondary | ICD-10-CM | POA: Diagnosis not present

## 2018-02-10 NOTE — Patient Instructions (Signed)
It was great meeting you today   Please follow up in December for your physical. If you need anything before that, please let me know

## 2018-02-10 NOTE — Progress Notes (Signed)
Patient presents to clinic today to establish care. She is a pleasant 84 year old female who  has a past medical history of ALLERGIC RHINITIS (11/11/2006), ANXIETY (11/11/2006), CONSTIPATION (02/23/2008), GERD (10/06/2007), HYPERTENSION (11/11/2006), HYPOKALEMIA, HX OF (02/09/2008), Memory loss, and OSTEOARTHRITIS (11/11/2006).  Her last CPE was in December 2018  Acute Concerns: Establish Care  Chronic Issues: Essential Hypertension - Controlled with Norvasc 10 mg, clonidine 0.1 mg nightly, metoprolol 25 mg twice daily, Micardis 80 mg BP Readings from Last 3 Encounters:  02/10/18 (!) 156/70  01/20/18 140/76  12/15/17 130/68   Dementia - Followed by Neurology-errantly prescribed Aricept 10 mg and Namenda 10 mg.  She is taking Risperdal 0.25 mg twice daily for hallucinations.  3 sets millimeters MMSE in August 2019 was 18 out of 30 ( 23/30 Nov. 2018). Her husband reports a slow progression over the last few months. Reports appetite is good. Often confused and no longer wants to participate in activities.    Depression - Lexapro 5 mg QHS   Health Maintenance: Dental -- Routine Care  Vision -- Routine Care  Immunizations --Needs flu vaccinations  Colonoscopy -- No longer needs Mammogram -- No longer Needs PAP -- No longer needs  Treatment Team - Neurology - Dr. Karel Jarvis    Past Medical History:  Diagnosis Date  . ALLERGIC RHINITIS 11/11/2006  . ANXIETY 11/11/2006  . CONSTIPATION 02/23/2008  . GERD 10/06/2007  . HYPERTENSION 11/11/2006  . HYPOKALEMIA, HX OF 02/09/2008  . Memory loss   . OSTEOARTHRITIS 11/11/2006    Past Surgical History:  Procedure Laterality Date  . EP IMPLANTABLE DEVICE N/A 03/21/2015   Procedure: Loop Recorder Removal;  Surgeon: Hillis Range, MD;  Location: [facility];  Service: Cardiovascular;  Laterality: N/A;  . LOOP RECORDER IMPLANT N/A 03/30/2014   MDT LINQ implanted by Dr Johney Frame for syncope  . TOTAL KNEE ARTHROPLASTY     Right    Current Outpatient  Medications on File Prior to Visit  Medication Sig Dispense Refill  . amLODipine (NORVASC) 10 MG tablet TAKE 1 TABLET BY MOUTH EVERY DAY 90 tablet 0  . cloNIDine (CATAPRES) 0.1 MG tablet TAKE 1 TABLET BY MOUTH EVERYDAY AT BEDTIME 90 tablet 1  . donepezil (ARICEPT) 10 MG tablet Take 1 tablet (10 mg total) by mouth at bedtime. 90 tablet 5  . escitalopram (LEXAPRO) 5 MG tablet Take 1 tablet (5 mg total) by mouth at bedtime. 30 tablet 11  . memantine (NAMENDA) 10 MG tablet Take 1 tablet twice a day 60 tablet 11  . metoprolol tartrate (LOPRESSOR) 50 MG tablet TAKE 1/2 TABLET BY MOUTH TWICE A DAY 180 tablet 0  . Misc Natural Products (ADRENAL PO) Take 1 tablet by mouth daily as needed (SINUSES).     . risperiDONE (RISPERDAL) 0.25 MG tablet TAKE 1 TABLET (0.25 MG TOTAL) BY MOUTH 2 (TWO) TIMES DAILY. 60 tablet 11  . telmisartan (MICARDIS) 80 MG tablet TAKE 1 TABLET BY MOUTH EVERY DAY 90 tablet 1   No current facility-administered medications on file prior to visit.     Allergies  Allergen Reactions  . Depakote Er [Divalproex Sodium Er] Hives    Family History  Problem Relation Age of Onset  . Stroke Mother   . Stroke Father   . Cancer Sister   . Stroke Brother   . Stroke Brother     Social History   Socioeconomic History  . Marital status: Married    Spouse name: Not on file  .  Number of children: Not on file  . Years of education: Not on file  . Highest education level: Not on file  Occupational History  . Not on file  Social Needs  . Financial resource strain: Not on file  . Food insecurity:    Worry: Not on file    Inability: Not on file  . Transportation needs:    Medical: Not on file    Non-medical: Not on file  Tobacco Use  . Smoking status: Never Smoker  . Smokeless tobacco: Never Used  Substance and Sexual Activity  . Alcohol use: No    Alcohol/week: 0.0 standard drinks  . Drug use: No  . Sexual activity: Not on file  Lifestyle  . Physical activity:    Days  per week: Not on file    Minutes per session: Not on file  . Stress: Not on file  Relationships  . Social connections:    Talks on phone: Not on file    Gets together: Not on file    Attends religious service: Not on file    Active member of club or organization: Not on file    Attends meetings of clubs or organizations: Not on file    Relationship status: Not on file  . Intimate partner violence:    Fear of current or ex partner: Not on file    Emotionally abused: Not on file    Physically abused: Not on file    Forced sexual activity: Not on file  Other Topics Concern  . Not on file  Social History Narrative  . Not on file    Review of Systems  Unable to perform ROS: Dementia    BP (!) 156/70   Temp 98.3 F (36.8 C) (Oral)   Wt 160 lb (72.6 kg)   BMI 29.26 kg/m   Physical Exam  Constitutional: She is oriented to person, place, and time. She appears well-developed and well-nourished. No distress.  Cardiovascular: Normal rate, regular rhythm, normal heart sounds and intact distal pulses.  Pulmonary/Chest: Effort normal and breath sounds normal.  Neurological: She is alert and oriented to person, place, and time.  Skin: Skin is warm and dry. She is not diaphoretic.  Psychiatric: She has a normal mood and affect. Her speech is delayed. She is slowed. Cognition and memory are impaired. She exhibits abnormal recent memory and abnormal remote memory.  Nursing note and vitals reviewed.   Assessment/Plan:  1. Encounter to establish care - Follow up in December for CPE  - Q6 month follow up.  - Follow up sooner if needed  2. Need for prophylactic vaccination and inoculation against influenza  - Flu vaccine HIGH DOSE PF (Fluzone High dose)  3. Mild dementia - Continue with POC by Neurology.   4. Essential hypertension - Will continue to monitor   5. Anxiety state - Continue with lexapro and Risperdal   Shirline Frees, NP

## 2018-03-18 ENCOUNTER — Telehealth: Payer: Self-pay

## 2018-03-18 NOTE — Telephone Encounter (Signed)
Pt's husband, Fayrene Fearing, states he has completed form for chair lift so they could get reimbursed for the sales tax. He states he left this form at the office and was told it would be faxed to [employer].   Misty - do you have this form by chance? Thanks!

## 2018-03-18 NOTE — Telephone Encounter (Signed)
Form faxed today by Isla Pence and received confirmation the fax was successful.  Faxed earlier by [employer].  No further action required.

## 2018-04-24 ENCOUNTER — Other Ambulatory Visit: Payer: Self-pay | Admitting: Internal Medicine

## 2018-04-26 NOTE — Telephone Encounter (Signed)
Cory, you have not filled this medication in the past.  Please advise. 

## 2018-04-26 NOTE — Telephone Encounter (Signed)
Ok to refill for one year  

## 2018-04-26 NOTE — Telephone Encounter (Signed)
Message routed to PCP CMA for refills. 

## 2018-04-27 ENCOUNTER — Encounter: Payer: Self-pay | Admitting: Neurology

## 2018-04-27 ENCOUNTER — Other Ambulatory Visit: Payer: Self-pay

## 2018-04-27 ENCOUNTER — Ambulatory Visit: Payer: Medicare HMO | Admitting: Neurology

## 2018-04-27 VITALS — BP 166/84 | HR 68 | Ht 60.0 in | Wt 161.0 lb

## 2018-04-27 DIAGNOSIS — R69 Illness, unspecified: Secondary | ICD-10-CM | POA: Diagnosis not present

## 2018-04-27 DIAGNOSIS — F0391 Unspecified dementia with behavioral disturbance: Secondary | ICD-10-CM | POA: Diagnosis not present

## 2018-04-27 DIAGNOSIS — F03B18 Unspecified dementia, moderate, with other behavioral disturbance: Secondary | ICD-10-CM

## 2018-04-27 MED ORDER — RISPERIDONE 0.25 MG PO TABS: ORAL_TABLET | ORAL | 11 refills | Status: AC

## 2018-04-27 MED ORDER — ESCITALOPRAM OXALATE 10 MG PO TABS: 10.0000 mg | ORAL_TABLET | Freq: Every day | ORAL | 11 refills | Status: AC

## 2018-04-27 NOTE — Patient Instructions (Signed)
1. Increase Lexapro (Escitalopram) to 10mg  every night. A new prescription has been sent to your pharmacy  2. Continue Risperdal 0.25mg  twice a day. May take additional 1 tablet at night if hallucinations occur  3. Continue Donepezil 10mg  daily and Memantine 10mg  twice a day  4. Recommend starting to have help coming in a few hours once a week to get used to people helping at home  5. Follow-up as scheduled in April, call for any changes  FALL PRECAUTIONS: Be cautious when walking. Scan the area for obstacles that may increase the risk of trips and falls. When getting up in the mornings, sit up at the edge of the bed for a few minutes before getting out of bed. Consider elevating the bed at the head end to avoid drop of blood pressure when getting up. Walk always in a well-lit room (use night lights in the walls). Avoid area rugs or power cords from appliances in the middle of the walkways. Use a walker or a cane if necessary and consider physical therapy for balance exercise. Get your eyesight checked regularly.   HOME SAFETY: Consider the safety of the kitchen when operating appliances like stoves, microwave oven, and blender. Consider having supervision and share cooking responsibilities until no longer able to participate in those. Accidents with firearms and other hazards in the house should be identified and addressed as well.   ABILITY TO BE LEFT ALONE: If patient is unable to contact 911 operator, consider using LifeLine, or when the need is there, arrange for someone to stay with patients. Smoking is a fire hazard, consider supervision or cessation. Risk of wandering should be assessed by caregiver and if detected at any point, supervision and safe proof recommendations should be instituted.  RECOMMENDATIONS FOR ALL PATIENTS WITH MEMORY PROBLEMS: 1. Continue to exercise (Recommend 30 minutes of walking everyday, or 3 hours every week) 2. Increase social interactions - continue going to  [place] and enjoy social gatherings with friends and family 3. Eat healthy, avoid fried foods and eat more fruits and vegetables 4. Maintain adequate blood pressure, blood sugar, and blood cholesterol level. Reducing the risk of stroke and cardiovascular disease also helps promoting better memory. 5. Avoid stressful situations. Live a simple life and avoid aggravations. Organize your time and prepare for the next day in anticipation. 6. Sleep well, avoid any interruptions of sleep and avoid any distractions in the bedroom that may interfere with adequate sleep quality 7. Avoid sugar, avoid sweets as there is a strong link between excessive sugar intake, diabetes, and cognitive impairment The Mediterranean diet has been shown to help patients reduce the risk of progressive memory disorders and reduces cardiovascular risk. This includes eating fish, eat fruits and green leafy vegetables, nuts like almonds and hazelnuts, walnuts, and also use olive oil. Avoid fast foods and fried foods as much as possible. Avoid sweets and sugar as sugar use has been linked to worsening of memory function.  There is always a concern of gradual progression of memory problems. If this is the case, then we may need to adjust level of care according to patient needs. Support, both to the patient and caregiver, should then be put into place.

## 2018-04-27 NOTE — Progress Notes (Signed)
NEUROLOGY FOLLOW UP OFFICE NOTE  Evelyn Irwin [id number]  DOB: 1933/12/19  HISTORY OF PRESENT ILLNESS: I had the pleasure of seeing Evelyn Irwin in follow-up in the neurology clinic on 04/27/2018. The patient was last seen 3 months ago for moderate dementia with behavioral disturbance, and presents for an earlier follow-up today due to an incident 4 nights ago. She is again accompanied by her husband who helps supplement the history today. She woke up at 11pm last Friday night, got dressed, saying she needed to go to [place] (her husband) in [place]. She was walking around looking for her car keys. She was also hallucinating, talking to her eldest daughter who lives out of state, her nephew, and a deceased family member. Her husband told her to go back to bed, she settled down after 2 hours. She has not been sleeping well the prior nights, but since then she has slept really well. She continues to take Risperdal 0.25mg  BID. She is also on Donepezil 10mg  daily and Memantine 10mg  BID. Her husband reports occasional daytime hallucinations, last week she was looking out the window and seeing someone outside. She needs help with dressing and bathing. Her husband manages her medications, finances, and meals. No further syncopal episodes since 2017.  HPI: This is an 84 yo RH woman with a history of hypertension who presented with recurrent syncopal episodes. The first episode occurred in 2013 when she woke up on the kitchen floor confused. She was admitted to [facility] where workup was unrevealing. Echo had shown basal septal hypertrophy and dynamic obstruction. She reports had another episode last August 2015 where she woke up on the grass, again feeling confused after. No tongue bite or urinary incontinence. She has chronic tinnitus and reports that the ringing in her ears may have gotten a little louder. She denied feeling dizzy or diaphoretic prior to the episodes. She had an ILR in October 2015. Her head CT  was unremarkable. Routine EEG showed occasional left temporal slowing, however her 24-hour EEG did not show similar changes and was within normal limits. She was unable to get an MRI due to loop recorder. She has seen cardiology with normal EF. She had another event last 09/25/14 while she was lying on bed watching a ballgame. She has chronic tinnitus but recalls the tinnitus increased, then when she came to, she was lying on her back and felt very confused. She got up and went to the bathroom and felt she did not recognize anything. She walked through all the bedrooms looking for "that man" (her husband), then her memory started coming back. She saw "that man sitting on the chair," but did not speak to him and went back to the room. When he came to find her 20 minutes later, her symptoms had cleared but she did not feel "quite right." No tongue bite or incontinence. She denied any associated headache but thinks she felt this episode coming on a few days prior because she was having increased ringing in her ears for 2 days in a row (no associated confusion). Loop recorder did not indicate any cardiac abnormalities.  She and her husband recall a curious episode in 2010 or 2011 when she suddenly felt sleepy and fell across the bed. When she got up she did not know where and who she was, did not recognize their kitchen. She went back to sleep and woke up fine.   She feels that since the syncopal episode in August 2015, she does not remember things  like she used to. They were on a cruise last November, and she forgot they were onboard a ship. She would move things around in their house and they cannot find them. She has only missed 1 bill payment, denies missing medications. No family history of similar symptoms.   Diagnostic Data: Head CT which did not show any acute changes. Her routine EEG had shown non-specific slowing in the left temporal region, a 24-hour EEG did not show similar findings, and was within  normal limits.   PAST MEDICAL HISTORY: Past Medical History:  Diagnosis Date  . ALLERGIC RHINITIS 11/11/2006  . ANXIETY 11/11/2006  . CONSTIPATION 02/23/2008  . GERD 10/06/2007  . HYPERTENSION 11/11/2006  . HYPOKALEMIA, HX OF 02/09/2008  . Memory loss   . OSTEOARTHRITIS 11/11/2006    MEDICATIONS:  Outpatient Encounter Medications as of 04/27/2018  Medication Sig  . amLODipine (NORVASC) 10 MG tablet TAKE 1 TABLET BY MOUTH EVERY DAY  . cloNIDine (CATAPRES) 0.1 MG tablet TAKE 1 TABLET BY MOUTH EVERYDAY AT BEDTIME  . donepezil (ARICEPT) 10 MG tablet Take 1 tablet (10 mg total) by mouth at bedtime.  Marland Kitchen escitalopram (LEXAPRO) 5 MG tablet Take 1 tablet (5 mg total) by mouth at bedtime.  . memantine (NAMENDA) 10 MG tablet Take 1 tablet twice a day  . metoprolol tartrate (LOPRESSOR) 50 MG tablet TAKE 1/2 TABLET BY MOUTH TWICE A DAY  . Misc Natural Products (ADRENAL PO) Take 1 tablet by mouth daily as needed (SINUSES).   . risperiDONE (RISPERDAL) 0.25 MG tablet TAKE 1 TABLET (0.25 MG TOTAL) BY MOUTH 2 (TWO) TIMES DAILY.  Marland Kitchen telmisartan (MICARDIS) 80 MG tablet TAKE 1 TABLET BY MOUTH EVERY DAY   No facility-administered encounter medications on file as of 04/27/2018.     ALLERGIES: Allergies  Allergen Reactions  . Depakote Er [Divalproex Sodium Er] Hives    FAMILY HISTORY: Family History  Problem Relation Age of Onset  . Stroke Mother   . Stroke Father   . Cancer Sister   . Stroke Brother   . Stroke Brother     SOCIAL HISTORY: Social History   Socioeconomic History  . Marital status: Married    Spouse name: Not on file  . Number of children: Not on file  . Years of education: Not on file  . Highest education level: Not on file  Occupational History  . Not on file  Social Needs  . Financial resource strain: Not on file  . Food insecurity:    Worry: Not on file    Inability: Not on file  . Transportation needs:    Medical: Not on file    Non-medical: Not on file  Tobacco  Use  . Smoking status: Never Smoker  . Smokeless tobacco: Never Used  Substance and Sexual Activity  . Alcohol use: No    Alcohol/week: 0.0 standard drinks  . Drug use: No  . Sexual activity: Not on file  Lifestyle  . Physical activity:    Days per week: Not on file    Minutes per session: Not on file  . Stress: Not on file  Relationships  . Social connections:    Talks on phone: Not on file    Gets together: Not on file    Attends religious service: Not on file    Active member of club or organization: Not on file    Attends meetings of clubs or organizations: Not on file    Relationship status: Not on file  .  Intimate partner violence:    Fear of current or ex partner: Not on file    Emotionally abused: Not on file    Physically abused: Not on file    Forced sexual activity: Not on file  Other Topics Concern  . Not on file  Social History Narrative   Retired        REVIEW OF SYSTEMS: Constitutional: No fevers, chills, or sweats, no generalized fatigue, change in appetite Eyes: No visual changes, double vision, eye pain Ear, nose and throat: No hearing loss, ear pain, nasal congestion, sore throat Cardiovascular: No chest pain, palpitations Respiratory:  No shortness of breath at rest or with exertion, wheezes GastrointestinaI: No nausea, vomiting, diarrhea, abdominal pain, fecal incontinence Genitourinary:  No dysuria, urinary retention or frequency Musculoskeletal:  No neck pain, back pain Integumentary: No rash, pruritus, skin lesions Neurological: as above Psychiatric: No depression, insomnia, anxiety Endocrine: No palpitations, fatigue, diaphoresis, mood swings, change in appetite, change in weight, increased thirst Hematologic/Lymphatic:  No anemia, purpura, petechiae. Allergic/Immunologic: no itchy/runny eyes, nasal congestion, recent allergic reactions, rashes  PHYSICAL EXAM: Vitals:   04/27/18 1509  BP: (!) 166/84  Pulse: 68  SpO2: 98%   General: No  acute distress, flat affect, minimal verbal output with poor eye contact today Head:  Normocephalic/atraumatic Neck: supple, no paraspinal tenderness, full range of motion Heart:  Regular rate and rhythm Lungs:  Clear to auscultation bilaterally Back: No paraspinal tenderness Skin/Extremities: No rash, no edema Neurological Exam: alert and oriented to person, place. No aphasia or dysarthria. Fund of knowledge is reduced.  Recent and remote memory impaired. Attention and concentration are reduced.  Able to name objects and repeat phrases. Cranial nerves: Pupils equal, round, reactive to light. Extraocular movements intact with no nystagmus. Visual fields full. Facial sensation intact. No facial asymmetry. Tongue, uvula, palate midline.  Motor: Bulk and tone normal, muscle strength 5/5 throughout with no pronator drift.  Sensation to light touch intact.  No extinction to double simultaneous stimulation.  Deep tendon reflexes 1+ throughout, toes downgoing.  Finger to nose testing intact.  Gait narrow-based and steady, slight difficulty with tandem walk but able (similar to prior).  Romberg negative.  IMPRESSION: This is an 84 yo RH woman with a history of hypertension, recurrent syncope of unclear etiology (none since 2017), and moderate dementia, likely Alzheimer's type. She presents for an earlier visit due to hallucinations at night that concerned her husband. This does not occur on a regular basis, she is taking Risperdal 0.25mg  BID, and can take an additional dose if needed for night time episodes. Her husband feels she does not sleep well at night, increasing Lexapro may help with anxiety and sleep better at night, increase to 10mg  daily. Continue Donepezil 10mg  daily and Memantine 10mg  BID. I again discussed increasing help at home for her husband, caregiver support provided today. She will follow-up as scheduled in April 2020, he knows to call for any changes.   Thank you for allowing me to  participate in her care.  Please do not hesitate to call for any questions or concerns.  The duration of this appointment visit was 30 minutes of face-to-face time with the patient.  Greater than 50% of this time was spent in counseling, explanation of diagnosis, planning of further management, and coordination of care.   Patrcia DollyKaren Aquino, M.D.   CC: Shirline Freesory Nafziger, NP

## 2018-04-30 ENCOUNTER — Emergency Department (HOSPITAL_COMMUNITY): Payer: Medicare HMO

## 2018-04-30 ENCOUNTER — Emergency Department (HOSPITAL_COMMUNITY)
Admission: EM | Admit: 2018-04-30 | Discharge: 2018-04-30 | Disposition: A | Discharge status: Home / Self Care | Payer: Medicare HMO | Attending: Emergency Medicine | Admitting: Emergency Medicine

## 2018-04-30 ENCOUNTER — Encounter (HOSPITAL_COMMUNITY): Payer: Self-pay | Admitting: Emergency Medicine

## 2018-04-30 DIAGNOSIS — Z79899 Other long term (current) drug therapy: Secondary | ICD-10-CM | POA: Insufficient documentation

## 2018-04-30 DIAGNOSIS — R69 Illness, unspecified: Secondary | ICD-10-CM | POA: Diagnosis not present

## 2018-04-30 DIAGNOSIS — I10 Essential (primary) hypertension: Secondary | ICD-10-CM | POA: Insufficient documentation

## 2018-04-30 DIAGNOSIS — N289 Disorder of kidney and ureter, unspecified: Secondary | ICD-10-CM | POA: Diagnosis not present

## 2018-04-30 DIAGNOSIS — E876 Hypokalemia: Secondary | ICD-10-CM | POA: Diagnosis not present

## 2018-04-30 DIAGNOSIS — R4020 Unspecified coma: Secondary | ICD-10-CM | POA: Diagnosis not present

## 2018-04-30 DIAGNOSIS — F0390 Unspecified dementia without behavioral disturbance: Secondary | ICD-10-CM | POA: Diagnosis not present

## 2018-04-30 DIAGNOSIS — N179 Acute kidney failure, unspecified: Secondary | ICD-10-CM | POA: Diagnosis not present

## 2018-04-30 DIAGNOSIS — R55 Syncope and collapse: Secondary | ICD-10-CM | POA: Diagnosis not present

## 2018-04-30 LAB — I-STAT TROPONIN, ED: Troponin i, poc: 0 ng/mL (ref 0.00–0.08)

## 2018-04-30 LAB — BASIC METABOLIC PANEL
Anion gap: 11 (ref 5–15)
BUN: 16 mg/dL (ref 8–23)
CO2: 23 mmol/L (ref 22–32)
Calcium: 9.3 mg/dL (ref 8.9–10.3)
Chloride: 108 mmol/L (ref 98–111)
Creatinine, Ser: 1.32 mg/dL — ABNORMAL HIGH (ref 0.44–1.00)
GFR calc Af Amer: 43 mL/min — ABNORMAL LOW (ref >60)
GFR calc non Af Amer: 37 mL/min — ABNORMAL LOW (ref >60)
Glucose, Bld: 128 mg/dL — ABNORMAL HIGH (ref 70–99)
Potassium: 3.4 mmol/L — ABNORMAL LOW (ref 3.5–5.1)
Sodium: 142 mmol/L (ref 135–145)

## 2018-04-30 LAB — URINALYSIS, ROUTINE W REFLEX MICROSCOPIC
Bacteria, UA: NONE SEEN
Bilirubin Urine: NEGATIVE
Glucose, UA: NEGATIVE mg/dL
Hgb urine dipstick: NEGATIVE
Ketones, ur: NEGATIVE mg/dL
Nitrite: NEGATIVE
Protein, ur: NEGATIVE mg/dL
Specific Gravity, Urine: 1.005 (ref 1.005–1.030)
pH: 8 (ref 5.0–8.0)

## 2018-04-30 LAB — CBC
HCT: 42.2 % (ref 36.0–46.0)
Hemoglobin: 12.8 g/dL (ref 12.0–15.0)
MCH: 30 pg (ref 26.0–34.0)
MCHC: 30.3 g/dL (ref 30.0–36.0)
MCV: 98.8 fL (ref 80.0–100.0)
Platelets: 261 10*3/uL (ref 150–400)
RBC: 4.27 MIL/uL (ref 3.87–5.11)
RDW: 14.5 % (ref 11.5–15.5)
WBC: 11.6 10*3/uL — ABNORMAL HIGH (ref 4.0–10.5)
nRBC: 0 % (ref 0.0–0.2)

## 2018-04-30 LAB — CBG MONITORING, ED: GLUCOSE-CAPILLARY: 113 mg/dL — AB (ref 70–99)

## 2018-04-30 MED ORDER — POTASSIUM CHLORIDE CRYS ER 20 MEQ PO TBCR
40.0000 meq | EXTENDED_RELEASE_TABLET | Freq: Once | ORAL | Status: AC
  Administered 2018-04-30: 40 meq via ORAL
  Filled 2018-04-30: qty 2

## 2018-04-30 NOTE — ED Provider Notes (Signed)
Care taken over from Dr. Ethelda ChickJacubowitz pending CT scan.  Her head CT is negative for acute abnormality.  Her other labs are non-concerning.  Her vital signs are stable.  Her blood pressure is mildly elevated.  She is able to ambulate without significant assistance or ataxia.  I discussed the findings with the family.  She likely had a syncope versus seizure episode.  They would like to take the patient home and not have her stay in the hospital.  Given her advanced dementia, I feel this is appropriate.  She has not had any visualized arrhythmias on monitoring in the ED.  No visualized seizure activity.  She will have close follow-up with her PCP and neurologist.   Rolan BuccoBelfi, Kieron Kantner, MD 04/30/18 [id number]

## 2018-04-30 NOTE — ED Triage Notes (Signed)
Per EMS- pt was at home with her family, they were about to give her a shower and she was sitting in the shower chair when she lost consciousness for several minutes. No seizure like activity reported. Pt is now awake and at her baseline.

## 2018-04-30 NOTE — ED Provider Notes (Signed)
[facility] EMERGENCY DEPARTMENT Provider Note   CSN: [id number] Arrival date & time: 04/30/18  1302     History   Chief Complaint Chief Complaint  Patient presents with  . Loss of Consciousness    HPI Evelyn Irwin is a 84 y.o. female.  Level 5 caveat dementia .history is obtained from patient's adult children who witnessed event.  At approximately 11 AM today patient had diminished level of consciousness for 2 to 3 minutes followed by drooling which lasted for a few minutes and then regained consciousness back to baseline level.  Patient has no recall of event.  She looks at baseline per her family who accompany her at present. HPI Had Past Medical History:  Diagnosis Date  . ALLERGIC RHINITIS 11/11/2006  . ANXIETY 11/11/2006  . CONSTIPATION 02/23/2008  . GERD 10/06/2007  . HYPERTENSION 11/11/2006  . HYPOKALEMIA, HX OF 02/09/2008  . Memory loss   . OSTEOARTHRITIS 11/11/2006    Patient Active Problem List   Diagnosis Date Noted  . Impaired glucose tolerance 12/15/2017  . Mild dementia (HCC) 06/15/2015  . Transient alteration of awareness 10/06/2014  . Syncope and collapse 01/23/2012  . Palpitations 01/23/2012  . CONSTIPATION 02/23/2008  . History of hypokalemia 02/09/2008  . GERD 10/06/2007  . Anxiety state 11/11/2006  . Essential hypertension 11/11/2006  . ALLERGIC RHINITIS 11/11/2006  . Osteoarthritis 11/11/2006    Past Surgical History:  Procedure Laterality Date  . EP IMPLANTABLE DEVICE N/A 03/21/2015   Procedure: Loop Recorder Removal;  Surgeon: Hillis Range, MD;  Location: [facility] INVASIVE CV LAB;  Service: Cardiovascular;  Laterality: N/A;  . LOOP RECORDER IMPLANT N/A 03/30/2014   MDT LINQ implanted by Dr Johney Frame for syncope  . TOTAL KNEE ARTHROPLASTY     Right     OB History   None      Home Medications    Prior to Admission medications   Medication Sig Start Date End Date Taking? Authorizing Provider  amLODipine (NORVASC) 10 MG tablet  TAKE 1 TABLET BY MOUTH EVERY DAY 04/27/18   Nafziger, Kandee Keen, NP  cloNIDine (CATAPRES) 0.1 MG tablet TAKE 1 TABLET BY MOUTH EVERYDAY AT BEDTIME 11/27/17   Gordy Savers, MD  donepezil (ARICEPT) 10 MG tablet Take 1 tablet (10 mg total) by mouth at bedtime. 01/20/18   Van Clines, MD  escitalopram (LEXAPRO) 10 MG tablet Take 1 tablet (10 mg total) by mouth daily. 04/27/18   Van Clines, MD  memantine [facility]) 10 MG tablet Take 1 tablet twice a day 01/20/18   Van Clines, MD  metoprolol tartrate (LOPRESSOR) 50 MG tablet TAKE 1/2 TABLET BY MOUTH TWICE A DAY 11/05/17   Gordy Savers, MD  Misc Natural Products (ADRENAL PO) Take 1 tablet by mouth daily as needed (SINUSES).     [provider]  risperiDONE (RISPERDAL) 0.25 MG tablet Take 1 tablet twice a day. May take additional dose as needed for hallucinations 04/27/18   Van Clines, MD  telmisartan (MICARDIS) 80 MG tablet TAKE 1 TABLET BY MOUTH EVERY DAY 01/18/18   Gordy Savers, MD    Family History Family History  Problem Relation Age of Onset  . Stroke Mother   . Stroke Father   . Cancer Sister   . Stroke Brother   . Stroke Brother     Social History Social History   Tobacco Use  . Smoking status: Never Smoker  . Smokeless tobacco: Never Used  Substance Use Topics  .  Alcohol use: No    Alcohol/week: 0.0 standard drinks  . Drug use: No     Allergies   Depakote er [divalproex sodium er]   Review of Systems Review of Systems  Unable to perform ROS: Dementia  Musculoskeletal: Positive for gait problem.       Walks with cane     Physical Exam Updated Vital Signs BP (!) 173/71   Pulse 73   Temp 97.9 F (36.6 C) (Oral)   Resp 19   SpO2 99%   Physical Exam  Constitutional: She appears well-developed and well-nourished. No distress.  HENT:  Head: Normocephalic and atraumatic.  Eyes: Pupils are equal, round, and reactive to light. Conjunctivae are normal.  Neck: Neck supple. No  tracheal deviation present. No thyromegaly present.  Cardiovascular: Normal rate and regular rhythm.  No murmur heard. Pulmonary/Chest: Effort normal and breath sounds normal.  Abdominal: Soft. Bowel sounds are normal. She exhibits no distension. There is no tenderness.  Musculoskeletal: Normal range of motion. She exhibits no edema or tenderness.  Neurological: She is alert. Coordination normal.  pleasntly confused, follows simple commands, moves all extremities.  Skin: Skin is warm and dry. Capillary refill takes less than 2 seconds. No rash noted.  Nursing note and vitals reviewed.    ED Treatments / Results  Labs (all labs ordered are listed, but only abnormal results are displayed) Labs Reviewed  BASIC METABOLIC PANEL - Abnormal; Notable for the following components:      Result Value   Potassium 3.4 (*)    Glucose, Bld 128 (*)    Creatinine, Ser 1.32 (*)    GFR calc non Af Amer 37 (*)    GFR calc Af Amer 43 (*)    All other components within normal limits  CBC - Abnormal; Notable for the following components:   WBC 11.6 (*)    All other components within normal limits  URINALYSIS, ROUTINE W REFLEX MICROSCOPIC  I-STAT TROPONIN, ED  CBG MONITORING, ED    EKG EKG Interpretation  Date/Time:  Friday April 30 2018 13:09:28 EST Ventricular Rate:  66 PR Interval:    QRS Duration: 89 QT Interval:  417 QTC Calculation: 437 R Axis:   37 Text Interpretation:  Sinus rhythm Probable left atrial enlargement Baseline wander in lead(s) II III aVF No significant change since last tracing Confirmed by Doug SouJacubowitz, Yancy Knoble [phone]([id number]) on 04/30/2018 1:23:02 PM   Radiology No results found.  Procedures Procedures (including critical care time)  Medications Ordered in ED Medications - No data to display  Results for orders placed or performed during the hospital encounter of 04/30/18  Basic metabolic panel  Result Value Ref Range   Sodium 142 135 - 145 mmol/L   Potassium 3.4 (L)  3.5 - 5.1 mmol/L   Chloride 108 98 - 111 mmol/L   CO2 23 22 - 32 mmol/L   Glucose, Bld 128 (H) 70 - 99 mg/dL   BUN 16 8 - 23 mg/dL   Creatinine, Ser 6.571.32 (H) 0.44 - 1.00 mg/dL   Calcium 9.3 8.9 - 84.610.3 mg/dL   GFR calc non Af Amer 37 (L) >60 mL/min   GFR calc Af Amer 43 (L) >60 mL/min   Anion gap 11 5 - 15  CBC  Result Value Ref Range   WBC 11.6 (H) 4.0 - 10.5 K/uL   RBC 4.27 3.87 - 5.11 MIL/uL   Hemoglobin 12.8 12.0 - 15.0 g/dL   HCT 96.242.2 95.236.0 - 84.146.0 %   MCV 98.8  80.0 - 100.0 fL   MCH 30.0 26.0 - 34.0 pg   MCHC 30.3 30.0 - 36.0 g/dL   RDW 16.1 09.6 - 04.5 %   Platelets 261 150 - 400 K/uL   nRBC 0.0 0.0 - 0.2 %  Urinalysis, Routine w reflex microscopic  Result Value Ref Range   Color, Urine STRAW (A) YELLOW   APPearance CLEAR CLEAR   Specific Gravity, Urine 1.005 1.005 - 1.030   pH 8.0 5.0 - 8.0   Glucose, UA NEGATIVE NEGATIVE mg/dL   Hgb urine dipstick NEGATIVE NEGATIVE   Bilirubin Urine NEGATIVE NEGATIVE   Ketones, ur NEGATIVE NEGATIVE mg/dL   Protein, ur NEGATIVE NEGATIVE mg/dL   Nitrite NEGATIVE NEGATIVE   Leukocytes, UA TRACE (A) NEGATIVE   RBC / HPF 0-5 0 - 5 RBC/hpf   WBC, UA 0-5 0 - 5 WBC/hpf   Bacteria, UA NONE SEEN NONE SEEN   Squamous Epithelial / LPF 0-5 0 - 5  CBG monitoring, ED  Result Value Ref Range   Glucose-Capillary 113 (H) 70 - 99 mg/dL  I-Stat Troponin, ED (not at [facility])  Result Value Ref Range   Troponin i, poc 0.00 0.00 - 0.08 ng/mL   Comment 3           Ct Head Wo Contrast  Result Date: 04/30/2018 CLINICAL DATA:  Loss of consciousness for several minutes today. No history of trauma. EXAM: CT HEAD WITHOUT CONTRAST TECHNIQUE: Contiguous axial images were obtained from the base of the skull through the vertex without intravenous contrast. COMPARISON:  Head CT scan 07/03/2014. FINDINGS: Brain: No evidence of acute infarction, hemorrhage, hydrocephalus, extra-axial collection or mass lesion/mass effect. Mild cortical atrophy is unchanged. Vascular:  Atherosclerosis noted. Skull: Intact.  No focal lesion. Sinuses/Orbits: Negative. Other: None. IMPRESSION: No acute abnormality. Mild atrophy. Atherosclerosis. Electronically Signed   By: Drusilla Kanner M.D.   On: 04/30/2018 16:20   Initial Impression / Assessment and Plan / ED Course  I have reviewed the triage vital signs and the nursing notes.  Pertinent labs & imaging results that were available during my care of the patient were reviewed by me and considered in my medical decision making (see chart for details).     I suspect the patient may have had seizure Patient signed out to Dr.Belfi4 20 p.m.  Lab work consistent with mild renal insufficiency and mild hypokalemia.  Oral potassium supplementation ordered Final Clinical Impressions(s) / ED Diagnoses   Dx#1Loss of consciousness #2 hypokalemia #2 mild renal insufficiency Final diagnoses:  None    ED Discharge Orders    None       Doug Sou, MD 04/30/18 [phone]

## 2018-05-04 ENCOUNTER — Other Ambulatory Visit: Payer: Self-pay | Admitting: Internal Medicine

## 2018-05-04 NOTE — Telephone Encounter (Signed)
Message routed to PCP CMA for refills. 

## 2018-05-04 NOTE — Telephone Encounter (Signed)
[name], you have not filled the metoprolol in the past.  Please advise.

## 2018-05-07 DIAGNOSIS — R2681 Unsteadiness on feet: Secondary | ICD-10-CM | POA: Diagnosis not present

## 2018-05-07 DIAGNOSIS — R69 Illness, unspecified: Secondary | ICD-10-CM | POA: Diagnosis not present

## 2018-05-07 DIAGNOSIS — I10 Essential (primary) hypertension: Secondary | ICD-10-CM | POA: Diagnosis not present

## 2018-05-07 DIAGNOSIS — Z0001 Encounter for general adult medical examination with abnormal findings: Secondary | ICD-10-CM | POA: Diagnosis not present

## 2018-05-11 ENCOUNTER — Ambulatory Visit: Payer: Medicare HMO | Admitting: Podiatry

## 2018-05-11 ENCOUNTER — Encounter: Payer: Self-pay | Admitting: Podiatry

## 2018-05-11 VITALS — BP 147/77

## 2018-05-11 DIAGNOSIS — M79675 Pain in left toe(s): Secondary | ICD-10-CM | POA: Diagnosis not present

## 2018-05-11 DIAGNOSIS — M79674 Pain in right toe(s): Secondary | ICD-10-CM

## 2018-05-11 DIAGNOSIS — B351 Tinea unguium: Secondary | ICD-10-CM | POA: Diagnosis not present

## 2018-05-11 DIAGNOSIS — L819 Disorder of pigmentation, unspecified: Secondary | ICD-10-CM | POA: Diagnosis not present

## 2018-05-11 DIAGNOSIS — N952 Postmenopausal atrophic vaginitis: Secondary | ICD-10-CM | POA: Insufficient documentation

## 2018-05-11 DIAGNOSIS — J069 Acute upper respiratory infection, unspecified: Secondary | ICD-10-CM | POA: Insufficient documentation

## 2018-05-11 NOTE — Patient Instructions (Signed)

## 2018-05-12 ENCOUNTER — Ambulatory Visit: Payer: Medicare HMO | Admitting: Adult Health

## 2018-05-12 ENCOUNTER — Encounter: Payer: Self-pay | Admitting: Adult Health

## 2018-05-12 VITALS — BP 138/80 | Temp 97.7°F | Ht 62.0 in | Wt 162.0 lb

## 2018-05-12 DIAGNOSIS — Z0001 Encounter for general adult medical examination with abnormal findings: Secondary | ICD-10-CM | POA: Diagnosis not present

## 2018-05-12 DIAGNOSIS — Z0000 Encounter for general adult medical examination without abnormal findings: Secondary | ICD-10-CM

## 2018-05-12 DIAGNOSIS — F0390 Unspecified dementia without behavioral disturbance: Secondary | ICD-10-CM

## 2018-05-12 DIAGNOSIS — R2681 Unsteadiness on feet: Secondary | ICD-10-CM | POA: Diagnosis not present

## 2018-05-12 DIAGNOSIS — R69 Illness, unspecified: Secondary | ICD-10-CM | POA: Diagnosis not present

## 2018-05-12 DIAGNOSIS — F03A0 Unspecified dementia, mild, without behavioral disturbance, psychotic disturbance, mood disturbance, and anxiety: Secondary | ICD-10-CM

## 2018-05-12 DIAGNOSIS — I10 Essential (primary) hypertension: Secondary | ICD-10-CM | POA: Diagnosis not present

## 2018-05-12 LAB — LIPID PANEL
Cholesterol: 160 mg/dL (ref 0–200)
HDL: 59.3 mg/dL (ref >39.00)
LDL Cholesterol: 76 mg/dL (ref 0–99)
NonHDL: 100.85
Total CHOL/HDL Ratio: 3
Triglycerides: 124 mg/dL (ref 0.0–149.0)
VLDL: 24.8 mg/dL (ref 0.0–40.0)

## 2018-05-12 LAB — CBC WITH DIFFERENTIAL/PLATELET
Basophils Absolute: 0 10*3/uL (ref 0.0–0.1)
Basophils Relative: 0.3 % (ref 0.0–3.0)
Eosinophils Absolute: 0 10*3/uL (ref 0.0–0.7)
Eosinophils Relative: 0.4 % (ref 0.0–5.0)
HCT: 38.5 % (ref 36.0–46.0)
Hemoglobin: 12.9 g/dL (ref 12.0–15.0)
Lymphocytes Relative: 20.4 % (ref 12.0–46.0)
Lymphs Abs: 2 10*3/uL (ref 0.7–4.0)
MCHC: 33.4 g/dL (ref 30.0–36.0)
MCV: 93.7 fl (ref 78.0–100.0)
Monocytes Absolute: 0.6 10*3/uL (ref 0.1–1.0)
Monocytes Relative: 6.4 % (ref 3.0–12.0)
Neutro Abs: 7 10*3/uL (ref 1.4–7.7)
Neutrophils Relative %: 72.5 % (ref 43.0–77.0)
Platelets: 301 10*3/uL (ref 150.0–400.0)
RBC: 4.11 Mil/uL (ref 3.87–5.11)
RDW: 15 % (ref 11.5–15.5)
WBC: 9.7 10*3/uL (ref 4.0–10.5)

## 2018-05-12 LAB — COMPREHENSIVE METABOLIC PANEL
ALBUMIN: 3.8 g/dL (ref 3.5–5.2)
ALT: 21 U/L (ref 0–35)
AST: 21 U/L (ref 0–37)
Alkaline Phosphatase: 76 U/L (ref 39–117)
BUN: 13 mg/dL (ref 6–23)
CO2: 31 mEq/L (ref 19–32)
Calcium: 9.2 mg/dL (ref 8.4–10.5)
Chloride: 104 mEq/L (ref 96–112)
Creatinine, Ser: 1.14 mg/dL (ref 0.40–1.20)
GFR: 58.59 mL/min — ABNORMAL LOW (ref >60.00)
Glucose, Bld: 66 mg/dL — ABNORMAL LOW (ref 70–99)
Potassium: 3.9 mEq/L (ref 3.5–5.1)
Sodium: 143 mEq/L (ref 135–145)
Total Bilirubin: 0.4 mg/dL (ref 0.2–1.2)
Total Protein: 6.6 g/dL (ref 6.0–8.3)

## 2018-05-12 LAB — TSH: TSH: 4.64 u[IU]/mL — AB (ref 0.35–4.50)

## 2018-05-12 NOTE — Progress Notes (Signed)
Subjective:    Patient ID: Evelyn Irwin, female    DOB: 1933-12-26, 84 y.o.   MRN: [id number]  HPI Patient presents for yearly preventative medicine examination. She is a pleasant 84 year old female who  has a past medical history of ALLERGIC RHINITIS (11/11/2006), ANXIETY (11/11/2006), CONSTIPATION (02/23/2008), GERD (10/06/2007), HYPERTENSION (11/11/2006), HYPOKALEMIA, HX OF (02/09/2008), Memory loss, and OSTEOARTHRITIS (11/11/2006).  Essential Hypertension - Controlled with Norvasc 10 mg, clonidine 0.1 mg nightly, metoprolol 25 mg twice daily, Micardis 80 mg- stable  BP Readings from Last 3 Encounters:  05/12/18 138/80  05/11/18 (!) 147/77  04/30/18 (!) 173/87   Dementia - Followed by Neurology-currantly prescribed Aricept 10 mg and Namenda 10 mg.  She is taking Risperdal 0.25 mg twice daily. She was last seen by Dr. Karel Jarvis three weeks ago. Appetite is good and her husband has been having her walk at home with a walker. Her husband is inquiring about home health PT to help with gait instability and strengthening exercise. He denies any falls.   Depression - Lexapro was increased to 10 mg QHS by Dr. Karel Jarvis for anxiety and hallucinations. Her husband reports that she is sleeping better throughout the night.   All immunizations and health maintenance protocols were reviewed with the patient and needed orders were placed. utd   Appropriate screening laboratory values were ordered for the patient including screening of hyperlipidemia, renal function and hepatic function.  Medication reconciliation,  past medical history, social history, problem list and allergies were reviewed in detail with the patient  Goals were established with regard to weight loss, exercise, and  diet in compliance with medications Wt Readings from Last 3 Encounters:  05/12/18 162 lb (73.5 kg)  04/27/18 161 lb (73 kg)  02/10/18 160 lb (72.6 kg)     She was seen in the ER about three weeks ago for transient episode of  altered LOC. Her head CT was negative and workup was unremarkable except for some mild hypokalemia. Her husband reports that she has not had any further episodes since that ER visit    Review of Systems  Unable to perform ROS: Dementia   Past Medical History:  Diagnosis Date  . ALLERGIC RHINITIS 11/11/2006  . ANXIETY 11/11/2006  . CONSTIPATION 02/23/2008  . GERD 10/06/2007  . HYPERTENSION 11/11/2006  . HYPOKALEMIA, HX OF 02/09/2008  . Memory loss   . OSTEOARTHRITIS 11/11/2006    Social History   Socioeconomic History  . Marital status: Married    Spouse name: Not on file  . Number of children: Not on file  . Years of education: Not on file  . Highest education level: Not on file  Occupational History  . Not on file  Social Needs  . Financial resource strain: Not on file  . Food insecurity:    Worry: Not on file    Inability: Not on file  . Transportation needs:    Medical: Not on file    Non-medical: Not on file  Tobacco Use  . Smoking status: Never Smoker  . Smokeless tobacco: Never Used  Substance and Sexual Activity  . Alcohol use: No    Alcohol/week: 0.0 standard drinks  . Drug use: No  . Sexual activity: Not on file  Lifestyle  . Physical activity:    Days per week: Not on file    Minutes per session: Not on file  . Stress: Not on file  Relationships  . Social connections:    Talks on phone: Not on  file    Gets together: Not on file    Attends religious service: Not on file    Active member of club or organization: Not on file    Attends meetings of clubs or organizations: Not on file    Relationship status: Not on file  . Intimate partner violence:    Fear of current or ex partner: Not on file    Emotionally abused: Not on file    Physically abused: Not on file    Forced sexual activity: Not on file  Other Topics Concern  . Not on file  Social History Narrative   Retired        Past Surgical History:  Procedure Laterality Date  . EP IMPLANTABLE  DEVICE N/A 03/21/2015   Procedure: Loop Recorder Removal;  Surgeon: Hillis RangeJames Allred, MD;  Location: [facility] INVASIVE CV LAB;  Service: Cardiovascular;  Laterality: N/A;  . LOOP RECORDER IMPLANT N/A 03/30/2014   MDT LINQ implanted by Dr Johney FrameAllred for syncope  . TOTAL KNEE ARTHROPLASTY     Right    Family History  Problem Relation Age of Onset  . Stroke Mother   . Stroke Father   . Cancer Sister   . Stroke Brother   . Stroke Brother     Allergies  Allergen Reactions  . Depakote Er [Divalproex Sodium Er] Hives    Current Outpatient Medications on File Prior to Visit  Medication Sig Dispense Refill  . amLODipine (NORVASC) 10 MG tablet TAKE 1 TABLET BY MOUTH EVERY DAY (Patient taking differently: Take 10 mg by mouth daily. ) 90 tablet 3  . cloNIDine (CATAPRES) 0.1 MG tablet TAKE 1 TABLET BY MOUTH EVERYDAY AT BEDTIME (Patient taking differently: Take 0.1 mg by mouth at bedtime. ) 90 tablet 1  . donepezil (ARICEPT) 10 MG tablet Take 1 tablet (10 mg total) by mouth at bedtime. 90 tablet 5  . escitalopram (LEXAPRO) 10 MG tablet Take 1 tablet (10 mg total) by mouth daily. 30 tablet 11  . memantine (NAMENDA) 10 MG tablet Take 1 tablet twice a day (Patient taking differently: Take 10 mg by mouth 2 (two) times daily. Take 1 tablet twice a day) 60 tablet 11  . metoprolol tartrate (LOPRESSOR) 50 MG tablet TAKE 1/2 TABLET BY MOUTH TWICE A DAY 90 tablet 1  . Misc Natural Products (ADRENAL PO) Take 1 tablet by mouth daily as needed (SINUSES).     . Multiple Vitamins-Minerals (MULTIVITAMIN WITH MINERALS) tablet Take 1 tablet by mouth daily. Woman over 50    . risperiDONE (RISPERDAL) 0.25 MG tablet Take 1 tablet twice a day. May take additional dose as needed for hallucinations (Patient taking differently: Take 0.25 mg by mouth 2 (two) times daily. . May take additional dose as needed for hallucinations) 75 tablet 11  . telmisartan (MICARDIS) 80 MG tablet TAKE 1 TABLET BY MOUTH EVERY DAY (Patient taking  differently: Take 80 mg by mouth daily. ) 90 tablet 1   No current facility-administered medications on file prior to visit.     BP 138/80   Temp 97.7 F (36.5 C)   Ht 5\' 2"  (1.575 m)   Wt 162 lb (73.5 kg)   BMI 29.63 kg/m      Objective:   Physical Exam  Constitutional: She is oriented to person, place, and time. She appears well-developed and well-nourished. No distress.  HENT:  Head: Normocephalic and atraumatic.  Right Ear: External ear normal.  Left Ear: External ear normal.  Nose: Nose normal.  Mouth/Throat: Oropharynx is clear and moist. No oropharyngeal exudate.  Eyes: Pupils are equal, round, and reactive to light. EOM are normal.  Cardiovascular: Normal rate, regular rhythm, normal heart sounds and intact distal pulses. Exam reveals no gallop and no friction rub.  No murmur heard. Pulmonary/Chest: Effort normal and breath sounds normal. No stridor. No respiratory distress. She has no wheezes. She has no rales. She exhibits no tenderness.  Abdominal: Soft. Bowel sounds are normal. She exhibits no distension and no mass. There is no tenderness. There is no rebound and no guarding. No hernia.  Musculoskeletal: Normal range of motion. She exhibits no edema, tenderness or deformity.  Neurological: She is alert and oriented to person, place, and time. She displays normal reflexes. No cranial nerve deficit or sensory deficit. She exhibits normal muscle tone. Coordination normal.  Skin: Skin is warm and dry. Capillary refill takes less than 2 seconds. She is not diaphoretic.  Psychiatric: She has a normal mood and affect. Her behavior is normal. Judgment and thought content normal.  Nursing note and vitals reviewed.     Assessment & Plan:  1. Routine general medical examination at a health care facility - CBC with Differential/Platelet - TSH - Comprehensive metabolic panel - Lipid panel  2. Essential hypertension - No change in medications  - CBC with  Differential/Platelet - TSH - Comprehensive metabolic panel - Lipid panel  3. Mild dementia (HCC) - Follow up with neurology as directed  - CBC with Differential/Platelet - TSH - Comprehensive metabolic panel - Lipid panel - Ambulatory referral to Home Health  4. Gait instability  - Ambulatory referral to Home Health  Shirline Frees, NP

## 2018-05-15 DIAGNOSIS — R69 Illness, unspecified: Secondary | ICD-10-CM | POA: Diagnosis not present

## 2018-05-15 DIAGNOSIS — M1990 Unspecified osteoarthritis, unspecified site: Secondary | ICD-10-CM | POA: Diagnosis not present

## 2018-05-15 DIAGNOSIS — F329 Major depressive disorder, single episode, unspecified: Secondary | ICD-10-CM | POA: Diagnosis not present

## 2018-05-15 DIAGNOSIS — I10 Essential (primary) hypertension: Secondary | ICD-10-CM | POA: Diagnosis not present

## 2018-05-15 DIAGNOSIS — F419 Anxiety disorder, unspecified: Secondary | ICD-10-CM | POA: Diagnosis not present

## 2018-05-19 ENCOUNTER — Other Ambulatory Visit: Payer: Self-pay | Admitting: Internal Medicine

## 2018-05-19 DIAGNOSIS — M1990 Unspecified osteoarthritis, unspecified site: Secondary | ICD-10-CM | POA: Diagnosis not present

## 2018-05-19 DIAGNOSIS — F329 Major depressive disorder, single episode, unspecified: Secondary | ICD-10-CM | POA: Diagnosis not present

## 2018-05-19 DIAGNOSIS — F419 Anxiety disorder, unspecified: Secondary | ICD-10-CM | POA: Diagnosis not present

## 2018-05-19 DIAGNOSIS — I10 Essential (primary) hypertension: Secondary | ICD-10-CM | POA: Diagnosis not present

## 2018-05-19 DIAGNOSIS — R69 Illness, unspecified: Secondary | ICD-10-CM | POA: Diagnosis not present

## 2018-05-21 DIAGNOSIS — F329 Major depressive disorder, single episode, unspecified: Secondary | ICD-10-CM | POA: Diagnosis not present

## 2018-05-21 DIAGNOSIS — R69 Illness, unspecified: Secondary | ICD-10-CM | POA: Diagnosis not present

## 2018-05-21 DIAGNOSIS — M1990 Unspecified osteoarthritis, unspecified site: Secondary | ICD-10-CM | POA: Diagnosis not present

## 2018-05-21 DIAGNOSIS — F419 Anxiety disorder, unspecified: Secondary | ICD-10-CM | POA: Diagnosis not present

## 2018-05-21 DIAGNOSIS — I10 Essential (primary) hypertension: Secondary | ICD-10-CM | POA: Diagnosis not present

## 2018-05-25 DIAGNOSIS — F329 Major depressive disorder, single episode, unspecified: Secondary | ICD-10-CM | POA: Diagnosis not present

## 2018-05-25 DIAGNOSIS — F419 Anxiety disorder, unspecified: Secondary | ICD-10-CM | POA: Diagnosis not present

## 2018-05-25 DIAGNOSIS — R69 Illness, unspecified: Secondary | ICD-10-CM | POA: Diagnosis not present

## 2018-05-25 DIAGNOSIS — M1990 Unspecified osteoarthritis, unspecified site: Secondary | ICD-10-CM | POA: Diagnosis not present

## 2018-05-25 DIAGNOSIS — I10 Essential (primary) hypertension: Secondary | ICD-10-CM | POA: Diagnosis not present

## 2018-05-27 DIAGNOSIS — M1990 Unspecified osteoarthritis, unspecified site: Secondary | ICD-10-CM | POA: Diagnosis not present

## 2018-05-27 DIAGNOSIS — I10 Essential (primary) hypertension: Secondary | ICD-10-CM | POA: Diagnosis not present

## 2018-05-27 DIAGNOSIS — R69 Illness, unspecified: Secondary | ICD-10-CM | POA: Diagnosis not present

## 2018-05-27 DIAGNOSIS — F419 Anxiety disorder, unspecified: Secondary | ICD-10-CM | POA: Diagnosis not present

## 2018-05-27 DIAGNOSIS — F329 Major depressive disorder, single episode, unspecified: Secondary | ICD-10-CM | POA: Diagnosis not present

## 2018-06-01 DIAGNOSIS — I10 Essential (primary) hypertension: Secondary | ICD-10-CM | POA: Diagnosis not present

## 2018-06-01 DIAGNOSIS — R69 Illness, unspecified: Secondary | ICD-10-CM | POA: Diagnosis not present

## 2018-06-01 DIAGNOSIS — F419 Anxiety disorder, unspecified: Secondary | ICD-10-CM | POA: Diagnosis not present

## 2018-06-01 DIAGNOSIS — F329 Major depressive disorder, single episode, unspecified: Secondary | ICD-10-CM | POA: Diagnosis not present

## 2018-06-01 DIAGNOSIS — M1990 Unspecified osteoarthritis, unspecified site: Secondary | ICD-10-CM | POA: Diagnosis not present

## 2018-06-03 DIAGNOSIS — F419 Anxiety disorder, unspecified: Secondary | ICD-10-CM | POA: Diagnosis not present

## 2018-06-03 DIAGNOSIS — I10 Essential (primary) hypertension: Secondary | ICD-10-CM | POA: Diagnosis not present

## 2018-06-03 DIAGNOSIS — M1990 Unspecified osteoarthritis, unspecified site: Secondary | ICD-10-CM | POA: Diagnosis not present

## 2018-06-03 DIAGNOSIS — F329 Major depressive disorder, single episode, unspecified: Secondary | ICD-10-CM | POA: Diagnosis not present

## 2018-06-03 DIAGNOSIS — R69 Illness, unspecified: Secondary | ICD-10-CM | POA: Diagnosis not present

## 2018-06-08 DIAGNOSIS — R69 Illness, unspecified: Secondary | ICD-10-CM | POA: Diagnosis not present

## 2018-06-08 DIAGNOSIS — M1990 Unspecified osteoarthritis, unspecified site: Secondary | ICD-10-CM | POA: Diagnosis not present

## 2018-06-08 DIAGNOSIS — I10 Essential (primary) hypertension: Secondary | ICD-10-CM | POA: Diagnosis not present

## 2018-06-08 DIAGNOSIS — F419 Anxiety disorder, unspecified: Secondary | ICD-10-CM | POA: Diagnosis not present

## 2018-06-08 DIAGNOSIS — F329 Major depressive disorder, single episode, unspecified: Secondary | ICD-10-CM | POA: Diagnosis not present

## 2018-06-11 DIAGNOSIS — F419 Anxiety disorder, unspecified: Secondary | ICD-10-CM | POA: Diagnosis not present

## 2018-06-11 DIAGNOSIS — F329 Major depressive disorder, single episode, unspecified: Secondary | ICD-10-CM | POA: Diagnosis not present

## 2018-06-11 DIAGNOSIS — I10 Essential (primary) hypertension: Secondary | ICD-10-CM | POA: Diagnosis not present

## 2018-06-11 DIAGNOSIS — M1990 Unspecified osteoarthritis, unspecified site: Secondary | ICD-10-CM | POA: Diagnosis not present

## 2018-06-11 DIAGNOSIS — R69 Illness, unspecified: Secondary | ICD-10-CM | POA: Diagnosis not present

## 2018-06-12 ENCOUNTER — Encounter: Payer: Self-pay | Admitting: Podiatry

## 2018-06-12 NOTE — Progress Notes (Addendum)
Subjective: Evelyn Irwin presents today accompanied by her husband. Husband states she has memory problems. She relates painful, thick toenails 1-5 b/l that she cannot cut. Pain is aggravated when wearing enclosed shoe gear.   Evelyn Frees, NP is her PCP and last dos was 02/10/2018.   Current Outpatient Medications:  .  amLODipine (NORVASC) 10 MG tablet, TAKE 1 TABLET BY MOUTH EVERY DAY (Patient taking differently: Take 10 mg by mouth daily. ), Disp: 90 tablet, Rfl: 3 .  cloNIDine (CATAPRES) 0.1 MG tablet, TAKE 1 TABLET BY MOUTH EVERYDAY AT BEDTIME (Patient taking differently: Take 0.1 mg by mouth at bedtime. ), Disp: 90 tablet, Rfl: 1 .  donepezil (ARICEPT) 10 MG tablet, Take 1 tablet (10 mg total) by mouth at bedtime., Disp: 90 tablet, Rfl: 5 .  escitalopram (LEXAPRO) 10 MG tablet, Take 1 tablet (10 mg total) by mouth daily., Disp: 30 tablet, Rfl: 11 .  memantine (NAMENDA) 10 MG tablet, Take 1 tablet twice a day (Patient taking differently: Take 10 mg by mouth 2 (two) times daily. Take 1 tablet twice a day), Disp: 60 tablet, Rfl: 11 .  metoprolol tartrate (LOPRESSOR) 50 MG tablet, TAKE 1/2 TABLET BY MOUTH TWICE A DAY, Disp: 90 tablet, Rfl: 1 .  Misc Natural Products (ADRENAL PO), Take 1 tablet by mouth daily as needed (SINUSES). , Disp: , Rfl:  .  Multiple Vitamins-Minerals (MULTIVITAMIN WITH MINERALS) tablet, Take 1 tablet by mouth daily. Woman over 50, Disp: , Rfl:  .  risperiDONE (RISPERDAL) 0.25 MG tablet, Take 1 tablet twice a day. May take additional dose as needed for hallucinations (Patient taking differently: Take 0.25 mg by mouth 2 (two) times daily. . May take additional dose as needed for hallucinations), Disp: 75 tablet, Rfl: 11 .  telmisartan (MICARDIS) 80 MG tablet, TAKE 1 TABLET BY MOUTH EVERY DAY (Patient taking differently: Take 80 mg by mouth daily. ), Disp: 90 tablet, Rfl: 1  Allergies  Allergen Reactions  . Depakote Er [Divalproex Sodium Er] Hives     Objective:  Vascular Examination: Capillary refill time immediate x 10 digits  Dorsalis pedis and Posterior tibial pulses palpable b/l  Digital hair present x 10 digits  Skin temperature gradient WNL b/l  Dermatological Examination: Skin with normal turgor, texture and tone b/l  Toenails 1-5 b/l discolored, thick, dystrophic with subungual debris and pain with palpation to nailbeds due to thickness of nails.    She has a large patch noted on the plantar aspect of her right heel with asymmetrical border, brown discoloration with darker centralized portion. No elevation. No nodule palpated. No tenderness to palpation, no flocculence, no erythema, no edema.  Musculoskeletal: Muscle strength 5/5 to all LE muscle groups  HAV with bunion b/l  Hammertoes 2-5 b/l  No pain, crepitus or joint limitation noted with ROM.   Neurological: Sensation intact with 10 gram monofilament. Vibratory sensation intact.  Assessment: 1. Painful onychomycosis toenails 1-5 b/l  2. Suspicious patch plantar heel right foot  Plan: 1. Toenails 1-5 b/l were debrided in length and girth without iatrogenic bleeding. 2. Recommend Dermatology consult for suspicious patch right heel. She will see her PCP on tomorrow and husband will discuss with him. 3. Patient to continue soft, supportive shoe gear 4. Patient to report any pedal injuries to medical professional immediately. 5. Follow up 3 months for onychomycosis. Patient/POA to call should there be a concern in the interim.

## 2018-06-14 ENCOUNTER — Other Ambulatory Visit: Payer: Self-pay | Admitting: Adult Health

## 2018-06-14 NOTE — Telephone Encounter (Signed)
Copied from CRM [id number]. Topic: Quick Communication - See Telephone Encounter >> Jun 14, 2018  3:30 PM Trula Slade wrote: CRM for notification. See Telephone encounter for: 06/14/18. Patient would like a refill on her cloNIDine (CATAPRES) 0.1 MG tablet medication and have it sent to her preferred pharmacy [facility] in [place] on [address]  Patient will be out of medication on Thurs 06/17/18.

## 2018-06-14 NOTE — Telephone Encounter (Signed)
Requested medication (s) are due for refill today: yes  Requested medication (s) are on the active medication list: yes  Last refill:  Last filled by Dr. Amador Cunas  Future visit scheduled: yes, 11/11/18 with Malachi Pro  Notes to clinic:  Medication last filled by Dr. Amador Cunas    Requested Prescriptions  Pending Prescriptions Disp Refills   cloNIDine (CATAPRES) 0.1 MG tablet 90 tablet 1    Sig: TAKE 1 TABLET BY MOUTH EVERYDAY AT BEDTIME     Cardiovascular:  Alpha-2 Agonists Passed - 06/14/2018  4:00 PM      Passed - Last BP in normal range    BP Readings from Last 1 Encounters:  05/12/18 138/80         Passed - Last Heart Rate in normal range    Pulse Readings from Last 1 Encounters:  04/30/18 72         Passed - Valid encounter within last 6 months    Recent Outpatient Visits          1 month ago Routine general medical examination at a health care facility   [facility] [place], [place], NP   4 months ago Need for prophylactic vaccination and inoculation against influenza   [facility], [place], NP   6 months ago Essential hypertension   [facility], Janett Labella, MD   12 months ago Essential hypertension   [facility] at [facility], Janett Labella, MD   1 year ago Encounter for preventive care   [facility] at [facility], Janett Labella, MD      Future Appointments            In 5 months Nafziger, Kandee Keen, NP [facility] [place], [facility]

## 2018-06-15 MED ORDER — CLONIDINE HCL 0.1 MG PO TABS: ORAL_TABLET | ORAL | 3 refills | Status: AC

## 2018-06-15 NOTE — Telephone Encounter (Signed)
Sent to the pharmacy by e-scribe. 

## 2018-06-24 ENCOUNTER — Telehealth: Payer: Self-pay | Admitting: Neurology

## 2018-06-24 NOTE — Telephone Encounter (Signed)
Calling regarding paperwork that was dropped off to be filled out. They are checking the status. Please Call. Thanks

## 2018-06-24 NOTE — Telephone Encounter (Signed)
Received paperwork from [facility] in regards to adult day program on 06/14/2018 - however pt information was incorrect on form.  I faxed from to [facility] ATTN Sidney Ace, asking for corrected form.  Did not receive anything after that.

## 2018-06-25 ENCOUNTER — Telehealth: Payer: Self-pay | Admitting: Adult Health

## 2018-06-25 NOTE — Telephone Encounter (Signed)
The patients husband dropped off forms for Adult Day care to be completed.   Fax forms to: [phone] ATTN: Rivka BarbaraGlenda  Disposition: Dr's folder

## 2018-06-29 NOTE — Telephone Encounter (Signed)
Received and placed on Cory's desk.

## 2018-07-01 ENCOUNTER — Ambulatory Visit: Payer: Self-pay | Admitting: *Deleted

## 2018-07-01 NOTE — Telephone Encounter (Signed)
Spoke to [place] and advised that she continue dosing as usual.  Nothing further needed.

## 2018-07-01 NOTE — Telephone Encounter (Signed)
Ok to take medication as normal

## 2018-07-01 NOTE — Telephone Encounter (Signed)
  Reason for Disposition . Caller has URGENT medication question about med that PCP prescribed and triager unable to answer question  Protocols used: MEDICATION QUESTION CALL-A-AH  

## 2018-07-01 NOTE — Telephone Encounter (Addendum)
Summary: should pt have meds this morning   Pt's daughter, Evelyn Irwin, calling. States that they accidentally gave pt 2 doses of night time meds (cloNIDine (CATAPRES) 0.1 MG tablet, risperiDONE (RISPERDAL) 0.25 MG tablet, and donepezil (ARICEPT) 10 MG tablet) to pt last night. They called paramedics and they checked her out but told her to call this morning and report BP to nurse to see if pt needed to take morning does of blood pressure medication or to skip it. [name] states blood pressure reading was 154/83 pulse 70, 153/81 pulse 68. [name] needs to know if they should give medications.      Patient is alert and awake this morning- ( she did sleep later -10:00) she has had shower and breakfast. Patient is complaining of some fatigue. Patient coughed a little last night- she is coughing a little more today- some chest congestion present. Daughter has requested appointment for that and she is scheduled for tomorrow. Please let her know if it is ok to go ahead with normal medication dosing today. Please call her [phone]

## 2018-07-02 ENCOUNTER — Telehealth: Payer: Self-pay

## 2018-07-02 ENCOUNTER — Encounter: Payer: Self-pay | Admitting: Family Medicine

## 2018-07-02 ENCOUNTER — Ambulatory Visit (INDEPENDENT_AMBULATORY_CARE_PROVIDER_SITE_OTHER): Payer: Medicare HMO | Admitting: Family Medicine

## 2018-07-02 VITALS — BP 136/72 | HR 71 | Temp 98.1°F | Wt 161.0 lb

## 2018-07-02 DIAGNOSIS — J069 Acute upper respiratory infection, unspecified: Secondary | ICD-10-CM | POA: Diagnosis not present

## 2018-07-02 DIAGNOSIS — F03A0 Unspecified dementia, mild, without behavioral disturbance, psychotic disturbance, mood disturbance, and anxiety: Secondary | ICD-10-CM

## 2018-07-02 DIAGNOSIS — F0390 Unspecified dementia without behavioral disturbance: Secondary | ICD-10-CM

## 2018-07-02 NOTE — Telephone Encounter (Signed)
Pt's primary caretaker has been her husband. He had a stroke on Monday and was taken off life support last night. They are not expecting him to recover at this time. The family is asking for assistance in locating resources for her long term care. I advised we may be able to place [facility] referral for assistance. They are also asking about having an FL2 completed.  Cory - Please advise. They said they can bring her in for OV if needed. Thanks!

## 2018-07-02 NOTE — Progress Notes (Signed)
   Subjective:    Patient ID: Evelyn Irwin, female    DOB: [birth year], 85 y.o.   MRN: [id number]  HPI Here with family for the onset yesterday of nasal congestion and a dry cough. No fever or ST. No NVD. She is drinking fluids.    Review of Systems  Constitutional: Negative.   HENT: Positive for congestion. Negative for ear pain, sinus pain and sore throat.   Eyes: Negative.   Respiratory: Positive for cough.   Gastrointestinal: Negative.        Objective:   Physical Exam Constitutional:      Appearance: Normal appearance. She is not ill-appearing.  HENT:     Right Ear: Tympanic membrane and ear canal normal.     Left Ear: Tympanic membrane and ear canal normal.     Nose: Nose normal.     Mouth/Throat:     Pharynx: Oropharynx is clear.  Eyes:     Conjunctiva/sclera: Conjunctivae normal.  Cardiovascular:     Rate and Rhythm: Normal rate and regular rhythm.     Pulses: Normal pulses.     Heart sounds: Normal heart sounds.  Pulmonary:     Effort: Pulmonary effort is normal. No respiratory distress.     Breath sounds: No stridor. No wheezing, rhonchi or rales.  Lymphadenopathy:     Cervical: No cervical adenopathy.  Neurological:     Mental Status: She is alert.           Assessment & Plan:  Viral URI. They may use Robitussin and Mucinex prn. Recheck prn. Gershon Crane, MD

## 2018-07-03 NOTE — Telephone Encounter (Signed)
I am sorry to hear about her husband. [facility]   They can also go and tour some of the facilities. Once they find a facility that they like then the facility will give them an FL2 for me to fill out   From what I have been told, [facility], is nice

## 2018-07-05 ENCOUNTER — Other Ambulatory Visit: Payer: Self-pay

## 2018-07-05 NOTE — Patient Outreach (Signed)
[facility]) Care Management  07/05/2018  Evelyn Irwin December 07, 1933 [id number]   Referral Date: 07/05/2018 Referral Source: MD Referral Referral Reason: Dementia, stroke, assist with placement   Outreach Attempt: spoke with daughter Evelyn Irwin [facility]).  She states they cannot talk right now that they have to meet with the funeral home shortly.  CM contact information given for return call.  She states that herself or her sister Evelyn Irwin will be calling back.     Plan: RN CM will wait return call from family.  If no return call will attempt again within 4 business days.     Bary Leriche, RN, MSN [facility] Care Management Care Management Coordinator Direct Line [phone] Toll Free: [phone]  Fax: [phone]

## 2018-07-06 ENCOUNTER — Other Ambulatory Visit: Payer: Self-pay

## 2018-07-06 NOTE — Patient Outreach (Signed)
[facility]) Care Management  07/06/2018  Marialyce Delhomme [birth year] [id number]  Referral Date: 07/05/2018 Referral Source: MD Referral Referral Reason: Dementia, stroke, assist with placement  Return call to daughter Journey Loch.  She is able to verify HIPAA.  She reports that her Dad who was primary caregiver to their mother passed from a stroke less than a week ago.  Patient has some dementia and cannot live alone. Patient is dependent with all aspects of care.  All children live in [place] and are now looking to place patient in a facility in [place].  Patient saw physician on Friday and [facility] referral was made to help with FL2 and placement.  Daughter Trudie Buckler is the POA per daughter Joni Reining but states they are going to check information with the city registar's office.    Plan: RN CM will refer to social work for assistance with placement.   Bary Leriche, RN, MSN [facility] Care Management Care Management Coordinator Direct Line [phone] Cell [phone] Toll Free: [phone]  Fax: [phone]

## 2018-07-06 NOTE — Telephone Encounter (Signed)
Form completed and faxed to [phone].  Attn: Glenda.  Nothing further needed at this time.

## 2018-07-06 NOTE — Telephone Encounter (Signed)
[facility] referral placed.  

## 2018-07-07 ENCOUNTER — Telehealth: Payer: Self-pay | Admitting: Adult Health

## 2018-07-07 ENCOUNTER — Other Ambulatory Visit: Payer: Self-pay | Admitting: Adult Health

## 2018-07-07 DIAGNOSIS — Z022 Encounter for examination for admission to residential institution: Secondary | ICD-10-CM

## 2018-07-07 NOTE — Telephone Encounter (Signed)
The patients daughter Joni Reining brought in forms to have completed for [facility] Orders and a FL2.  The facility in [place] needs a current chest XRAY report. The last chest XRAY was in 2015.  Fax forms to: [phone]  ATTN: Nemiah Commander

## 2018-07-07 NOTE — Telephone Encounter (Addendum)
Patient's daughter calling to check the status of the order being placed. States that they are in a little bit of a hurry because they are trying to get the patient to [place] early next week. Advised that there has not been a chest xray ordered yet.

## 2018-07-07 NOTE — Telephone Encounter (Signed)
Order for xray is in, she can come tomorrow.   I do not have any paperwork to fill out

## 2018-07-08 ENCOUNTER — Ambulatory Visit: Payer: Medicare HMO

## 2018-07-08 ENCOUNTER — Ambulatory Visit (INDEPENDENT_AMBULATORY_CARE_PROVIDER_SITE_OTHER): Payer: Medicare HMO

## 2018-07-08 DIAGNOSIS — Z022 Encounter for examination for admission to residential institution: Secondary | ICD-10-CM

## 2018-07-08 DIAGNOSIS — R079 Chest pain, unspecified: Secondary | ICD-10-CM | POA: Diagnosis not present

## 2018-07-08 NOTE — Telephone Encounter (Signed)
Evelyn Irwin notified to bring pt for x-ray.

## 2018-07-08 NOTE — Telephone Encounter (Signed)
Patient's daughter called to get the status of the paperwork she left at the front desk.  Relayed the message that Dr. Kandee Keen said he did not have any paperwork to complete.  Patient stated that she did drop it off at the front.  Please check and have someone call daughter back.  CB# [phone]

## 2018-07-09 ENCOUNTER — Other Ambulatory Visit: Payer: Self-pay | Admitting: Licensed Clinical Social Worker

## 2018-07-09 NOTE — Patient Outreach (Signed)
[facility]) Care Management  07/09/2018  Evelyn Irwin 14-Sep-1933 [id number]  [facility] CSW received the following referral on 07/06/2018 and completed initial outreach on 07/09/2018. Referral states: Please refer patient to social work.Please call daughter Evelyn Irwin [phone]. Patient's spouse who was the primary caregiver passed away of a stroke. Children are trying to get patient placed in [place] but need help with completing FL2. [facility] CSW completed call to patient's daughter Evelyn Irwin and was able to successfully reach her. HIPPA verifications received successfully. [facility] CSW introduced self, reason for call and of [facility] CM Services. Daughter reports that they have everything in order now for patient's LTC placement and have an expected admit date of 07/14/2018. Daughter reports that FL2, PT evaluation and chest X-ray were all completed. Daughter denies any further social work needs at this time now as placement is being established already. Daughter reports that patient will go to a memory care facility and that it is only 10 minutes away from her so that she can visit her often. Daughter appreciative of phone call and SNF placement follow up. [facility] CSW will sign off at this time as patient will be placed in a LTC facility next week.  Dickie La, BSW, MSW, LCSW [email or web address] Phone: [phone] Fax: [phone]

## 2018-07-09 NOTE — Telephone Encounter (Signed)
Patient's daughter calling to check status of paperwork and imaging results, as she would like to come by office this afternoon to pick both up. Please advise.    Cb# [phone]

## 2018-07-09 NOTE — Telephone Encounter (Signed)
Paper work faxed.  Received confirmation that the fax was successful.

## 2018-07-09 NOTE — Telephone Encounter (Signed)
Notified to pick up at the front desk.  Nothing further needed.

## 2018-07-12 NOTE — Telephone Encounter (Signed)
Pts daughter and son calling to request the DNR from a physician in [place] to be completed as well. And to see if the forms can be re-faxed to [place] as they only received the xray report.

## 2018-07-13 ENCOUNTER — Encounter: Payer: Self-pay | Admitting: Adult Health

## 2018-07-13 ENCOUNTER — Telehealth: Payer: Self-pay

## 2018-07-13 ENCOUNTER — Other Ambulatory Visit: Payer: Self-pay | Admitting: Internal Medicine

## 2018-07-13 ENCOUNTER — Ambulatory Visit: Payer: Medicare HMO | Admitting: Adult Health

## 2018-07-13 ENCOUNTER — Telehealth: Payer: Self-pay | Admitting: Adult Health

## 2018-07-13 VITALS — BP 126/70 | Temp 97.6°F | Wt 157.0 lb

## 2018-07-13 DIAGNOSIS — Z0289 Encounter for other administrative examinations: Secondary | ICD-10-CM

## 2018-07-13 NOTE — Telephone Encounter (Signed)
[place] calling pt to get her scheduled to see [facility].

## 2018-07-13 NOTE — Telephone Encounter (Signed)
Faxed and received confirmation that the fax was successful.

## 2018-07-13 NOTE — Telephone Encounter (Signed)
Copied from CRM [id number]. Topic: General - Other >> Jul 12, 2018 12:38 PM Evelyn Irwin, Evelyn Irwin wrote: Reason for CRM: [place] will be picking up the paperwork today for her relocation just calling to let the office now.   Best call back is [phone] >> Jul 12, 2018  2:04 PM Irwin, Evelyn SpillersLawana T wrote: Evelyn Irwin (son) came in to pick up his mothers paperwork and it was not completed (page 4&5 need to be completed 12a, 12b, 12c and 12d) and he also needed a [place] DNR filled out since the provider was not able to fill out the [place] one.  They also need 6 months worth of the pts records to be attached to the paperwork.

## 2018-07-13 NOTE — Telephone Encounter (Signed)
Pt coming in at 11:15 to be seen by [facility] in OV.  Paper work to be filled out at that time.

## 2018-07-13 NOTE — Telephone Encounter (Signed)
Please see if we have these documents that were sent in

## 2018-07-13 NOTE — Progress Notes (Signed)
Subjective:    Patient ID: Evelyn Irwin, female    DOB: 02-13-1934, 85 y.o.   MRN: [id number]  HPI  85 year old female who  has a past medical history of ALLERGIC RHINITIS (11/11/2006), ANXIETY (11/11/2006), CONSTIPATION (02/23/2008), GERD (10/06/2007), HYPERTENSION (11/11/2006), HYPOKALEMIA, HX OF (02/09/2008), Memory loss, and OSTEOARTHRITIS (11/11/2006).  She presents to the office today with her daughter and POA to have a DNR and MOST form signed. Patient will be moving to [place] with her family after her husbands recent death.   Her facility needs paperwork filled out for facility and a DNR/MOST form completed.   Review of Systems  Unable to perform ROS: Dementia     Past Medical History:  Diagnosis Date  . ALLERGIC RHINITIS 11/11/2006  . ANXIETY 11/11/2006  . CONSTIPATION 02/23/2008  . GERD 10/06/2007  . HYPERTENSION 11/11/2006  . HYPOKALEMIA, HX OF 02/09/2008  . Memory loss   . OSTEOARTHRITIS 11/11/2006    Social History   Socioeconomic History  . Marital status: Married    Spouse name: Not on file  . Number of children: Not on file  . Years of education: Not on file  . Highest education level: Not on file  Occupational History  . Not on file  Social Needs  . Financial resource strain: Not on file  . Food insecurity:    Worry: Not on file    Inability: Not on file  . Transportation needs:    Medical: Not on file    Non-medical: Not on file  Tobacco Use  . Smoking status: Never Smoker  . Smokeless tobacco: Never Used  Substance and Sexual Activity  . Alcohol use: No    Alcohol/week: 0.0 standard drinks  . Drug use: No  . Sexual activity: Not on file  Lifestyle  . Physical activity:    Days per week: Not on file    Minutes per session: Not on file  . Stress: Not on file  Relationships  . Social connections:    Talks on phone: Not on file    Gets together: Not on file    Attends religious service: Not on file    Active member of club or organization: Not on file      Attends meetings of clubs or organizations: Not on file    Relationship status: Not on file  . Intimate partner violence:    Fear of current or ex partner: Not on file    Emotionally abused: Not on file    Physically abused: Not on file    Forced sexual activity: Not on file  Other Topics Concern  . Not on file  Social History Narrative   Retired        Past Surgical History:  Procedure Laterality Date  . EP IMPLANTABLE DEVICE N/A 03/21/2015   Procedure: Loop Recorder Removal;  Surgeon: Hillis Range, MD;  Location: [facility] INVASIVE CV LAB;  Service: Cardiovascular;  Laterality: N/A;  . LOOP RECORDER IMPLANT N/A 03/30/2014   MDT LINQ implanted by Dr Johney Frame for syncope  . TOTAL KNEE ARTHROPLASTY     Right    Family History  Problem Relation Age of Onset  . Stroke Mother   . Stroke Father   . Cancer Sister   . Stroke Brother   . Stroke Brother     Allergies  Allergen Reactions  . Depakote Er [Divalproex Sodium Er] Hives    Current Outpatient Medications on File Prior to Visit  Medication Sig Dispense Refill  .  amLODipine (NORVASC) 10 MG tablet TAKE 1 TABLET BY MOUTH EVERY DAY (Patient taking differently: Take 10 mg by mouth daily. ) 90 tablet 3  . cloNIDine (CATAPRES) 0.1 MG tablet TAKE 1 TABLET BY MOUTH EVERYDAY AT BEDTIME 90 tablet 3  . donepezil (ARICEPT) 10 MG tablet Take 1 tablet (10 mg total) by mouth at bedtime. 90 tablet 5  . escitalopram (LEXAPRO) 10 MG tablet Take 1 tablet (10 mg total) by mouth daily. 30 tablet 11  . memantine (NAMENDA) 10 MG tablet Take 1 tablet twice a day (Patient taking differently: Take 10 mg by mouth 2 (two) times daily. Take 1 tablet twice a day) 60 tablet 11  . metoprolol tartrate (LOPRESSOR) 50 MG tablet TAKE 1/2 TABLET BY MOUTH TWICE A DAY 90 tablet 1  . Misc Natural Products (ADRENAL PO) Take 1 tablet by mouth daily as needed (SINUSES).     . Multiple Vitamins-Minerals (MULTIVITAMIN WITH MINERALS) tablet Take 1 tablet by mouth daily.  Woman over 50    . risperiDONE (RISPERDAL) 0.25 MG tablet Take 1 tablet twice a day. May take additional dose as needed for hallucinations (Patient taking differently: Take 0.25 mg by mouth 2 (two) times daily. . May take additional dose as needed for hallucinations) 75 tablet 11  . telmisartan (MICARDIS) 80 MG tablet TAKE 1 TABLET BY MOUTH EVERY DAY (Patient taking differently: Take 80 mg by mouth daily. ) 90 tablet 1   No current facility-administered medications on file prior to visit.     BP 126/70   Temp 97.6 F (36.4 C)   Wt 157 lb (71.2 kg)   BMI 28.72 kg/m       Objective:   Physical Exam Vitals signs and nursing note reviewed.  Constitutional:      Appearance: Normal appearance.  Neurological:     Mental Status: She is alert. She is disoriented.  Psychiatric:        Attention and Perception: Attention normal.        Mood and Affect: Mood normal. Affect is flat.        Speech: Speech is delayed.        Cognition and Memory: Cognition is impaired. Memory is impaired. She exhibits impaired recent memory and impaired remote memory.       Assessment & Plan:  1. Encounter for completion of form with patient - Reviewed MOST and DNR forms with patients daughter/POA.  - Forms for facility filled out/scanned/and faxed. Copy given to daughter.  - Wished her the best and to follow up if they need anything prior to moving   Shirline Frees, NP

## 2018-07-13 NOTE — Telephone Encounter (Signed)
Pt scheduled to see Cory at 11:15 AM.

## 2018-07-13 NOTE — Telephone Encounter (Signed)
Copied from CRM [id number]. Topic: General - Inquiry >> Jul 13, 2018  8:50 AM Maia Petties wrote: Reason for CRM: Pt is moving to a facility in [place] and they received a copy of the forms that [place] completed. Joyce Gross is refaxing pages 4 and 5 to [phone] (medication list pages) as they require to be filled out/handwritten as this is used as "orders" for the patient. This also needs to be signed. Please include all prescription medications, OTC medications, and orders for PT and DME. If the pt is taking any narcotics they need a hard copy RX sent separately.   In addition to the medications, please provide documentation on any wounds (if relevant). Please provide copy of PPD administration/ready and/or chest xray report. Please provide copy of DNR, documentation for natural death (to administer CPR or not). Without this they document as a "full code". Send copy of POA paperwork as well. (I found 2 documents scanned in pts chart for natural death and POA).  Joyce Gross requesting information urgently.  Fax # [phone] ATTN: Nemiah Commander Phone # [phone], secure VM and ok to leave detailed message if not able to answer

## 2018-07-15 DIAGNOSIS — I10 Essential (primary) hypertension: Secondary | ICD-10-CM | POA: Diagnosis not present

## 2018-07-15 DIAGNOSIS — R69 Illness, unspecified: Secondary | ICD-10-CM | POA: Diagnosis not present

## 2018-07-15 DIAGNOSIS — R55 Syncope and collapse: Secondary | ICD-10-CM | POA: Diagnosis not present

## 2018-07-15 DIAGNOSIS — M1990 Unspecified osteoarthritis, unspecified site: Secondary | ICD-10-CM | POA: Diagnosis not present

## 2018-07-15 DIAGNOSIS — R262 Difficulty in walking, not elsewhere classified: Secondary | ICD-10-CM | POA: Diagnosis not present

## 2018-07-20 DIAGNOSIS — R69 Illness, unspecified: Secondary | ICD-10-CM | POA: Diagnosis not present

## 2018-07-26 DIAGNOSIS — R69 Illness, unspecified: Secondary | ICD-10-CM | POA: Diagnosis not present

## 2018-07-28 DIAGNOSIS — R600 Localized edema: Secondary | ICD-10-CM | POA: Diagnosis not present

## 2018-07-28 DIAGNOSIS — E870 Hyperosmolality and hypernatremia: Secondary | ICD-10-CM | POA: Diagnosis not present

## 2018-08-03 DIAGNOSIS — R69 Illness, unspecified: Secondary | ICD-10-CM | POA: Diagnosis not present

## 2018-08-05 ENCOUNTER — Ambulatory Visit: Payer: Medicare HMO | Admitting: Podiatry

## 2018-08-24 DIAGNOSIS — R69 Illness, unspecified: Secondary | ICD-10-CM | POA: Diagnosis not present

## 2018-08-25 DIAGNOSIS — R600 Localized edema: Secondary | ICD-10-CM | POA: Diagnosis not present

## 2018-08-25 DIAGNOSIS — R69 Illness, unspecified: Secondary | ICD-10-CM | POA: Diagnosis not present

## 2018-08-31 DIAGNOSIS — R69 Illness, unspecified: Secondary | ICD-10-CM | POA: Diagnosis not present

## 2018-09-07 ENCOUNTER — Encounter: Payer: Self-pay | Admitting: Neurology

## 2018-09-08 ENCOUNTER — Ambulatory Visit: Payer: Medicare HMO | Admitting: Neurology

## 2018-09-08 DIAGNOSIS — R69 Illness, unspecified: Secondary | ICD-10-CM | POA: Diagnosis not present

## 2018-09-15 DIAGNOSIS — R69 Illness, unspecified: Secondary | ICD-10-CM | POA: Diagnosis not present

## 2018-09-16 ENCOUNTER — Telehealth: Payer: Self-pay | Admitting: Neurology

## 2018-09-16 NOTE — Telephone Encounter (Signed)
Daughter called and just wanted you to know that patient has moved to [facility] to be with family and is in a nursing home there. Her husband passed away in 07-03-2022 and they moved her to be with them up there. Just FYI. Thanks!

## 2018-09-17 NOTE — Telephone Encounter (Signed)
FYI

## 2018-10-08 DIAGNOSIS — R5383 Other fatigue: Secondary | ICD-10-CM | POA: Diagnosis not present

## 2018-10-08 DIAGNOSIS — Z20828 Contact with and (suspected) exposure to other viral communicable diseases: Secondary | ICD-10-CM | POA: Diagnosis not present

## 2018-10-08 DIAGNOSIS — R630 Anorexia: Secondary | ICD-10-CM | POA: Diagnosis not present

## 2018-10-12 DIAGNOSIS — R630 Anorexia: Secondary | ICD-10-CM | POA: Diagnosis not present

## 2018-10-12 DIAGNOSIS — U071 COVID-19: Secondary | ICD-10-CM | POA: Diagnosis not present

## 2018-10-12 DIAGNOSIS — R262 Difficulty in walking, not elsewhere classified: Secondary | ICD-10-CM | POA: Diagnosis not present

## 2018-10-12 DIAGNOSIS — R5383 Other fatigue: Secondary | ICD-10-CM | POA: Diagnosis not present

## 2018-10-14 DIAGNOSIS — K5900 Constipation, unspecified: Secondary | ICD-10-CM | POA: Diagnosis not present

## 2018-10-14 DIAGNOSIS — N390 Urinary tract infection, site not specified: Secondary | ICD-10-CM | POA: Diagnosis not present

## 2018-10-14 DIAGNOSIS — R531 Weakness: Secondary | ICD-10-CM | POA: Diagnosis not present

## 2018-10-14 DIAGNOSIS — U071 COVID-19: Secondary | ICD-10-CM | POA: Diagnosis not present

## 2018-10-14 DIAGNOSIS — K219 Gastro-esophageal reflux disease without esophagitis: Secondary | ICD-10-CM | POA: Diagnosis not present

## 2018-10-14 DIAGNOSIS — N179 Acute kidney failure, unspecified: Secondary | ICD-10-CM | POA: Diagnosis not present

## 2018-10-14 DIAGNOSIS — Z743 Need for continuous supervision: Secondary | ICD-10-CM | POA: Diagnosis not present

## 2018-10-14 DIAGNOSIS — R69 Illness, unspecified: Secondary | ICD-10-CM | POA: Diagnosis not present

## 2018-10-14 DIAGNOSIS — I10 Essential (primary) hypertension: Secondary | ICD-10-CM | POA: Diagnosis not present

## 2018-10-14 DIAGNOSIS — B342 Coronavirus infection, unspecified: Secondary | ICD-10-CM | POA: Diagnosis not present

## 2018-10-14 DIAGNOSIS — N3000 Acute cystitis without hematuria: Secondary | ICD-10-CM | POA: Diagnosis not present

## 2018-10-14 DIAGNOSIS — R5383 Other fatigue: Secondary | ICD-10-CM | POA: Diagnosis not present

## 2018-10-14 DIAGNOSIS — Z79899 Other long term (current) drug therapy: Secondary | ICD-10-CM | POA: Diagnosis not present

## 2018-10-14 DIAGNOSIS — R630 Anorexia: Secondary | ICD-10-CM | POA: Diagnosis not present

## 2018-10-18 DIAGNOSIS — R69 Illness, unspecified: Secondary | ICD-10-CM | POA: Diagnosis not present

## 2018-10-19 DIAGNOSIS — R55 Syncope and collapse: Secondary | ICD-10-CM | POA: Diagnosis not present

## 2018-10-19 DIAGNOSIS — I10 Essential (primary) hypertension: Secondary | ICD-10-CM | POA: Diagnosis not present

## 2018-10-19 DIAGNOSIS — R69 Illness, unspecified: Secondary | ICD-10-CM | POA: Diagnosis not present

## 2018-10-19 DIAGNOSIS — U071 COVID-19: Secondary | ICD-10-CM | POA: Diagnosis not present

## 2018-10-19 DIAGNOSIS — N3000 Acute cystitis without hematuria: Secondary | ICD-10-CM | POA: Diagnosis not present

## 2018-10-19 DIAGNOSIS — N179 Acute kidney failure, unspecified: Secondary | ICD-10-CM | POA: Diagnosis not present

## 2018-10-19 DIAGNOSIS — R5383 Other fatigue: Secondary | ICD-10-CM | POA: Diagnosis not present

## 2018-10-26 DIAGNOSIS — N179 Acute kidney failure, unspecified: Secondary | ICD-10-CM | POA: Diagnosis not present

## 2018-10-26 DIAGNOSIS — I10 Essential (primary) hypertension: Secondary | ICD-10-CM | POA: Diagnosis not present

## 2018-10-26 DIAGNOSIS — N3000 Acute cystitis without hematuria: Secondary | ICD-10-CM | POA: Diagnosis not present

## 2018-10-27 DIAGNOSIS — R69 Illness, unspecified: Secondary | ICD-10-CM | POA: Diagnosis not present

## 2018-11-02 DIAGNOSIS — I10 Essential (primary) hypertension: Secondary | ICD-10-CM | POA: Diagnosis not present

## 2018-11-02 DIAGNOSIS — R630 Anorexia: Secondary | ICD-10-CM | POA: Diagnosis not present

## 2018-11-02 DIAGNOSIS — N179 Acute kidney failure, unspecified: Secondary | ICD-10-CM | POA: Diagnosis not present

## 2018-11-10 DIAGNOSIS — I10 Essential (primary) hypertension: Secondary | ICD-10-CM | POA: Diagnosis not present

## 2018-11-10 DIAGNOSIS — R55 Syncope and collapse: Secondary | ICD-10-CM | POA: Diagnosis not present

## 2018-11-10 DIAGNOSIS — N179 Acute kidney failure, unspecified: Secondary | ICD-10-CM | POA: Diagnosis not present

## 2018-11-10 DIAGNOSIS — R262 Difficulty in walking, not elsewhere classified: Secondary | ICD-10-CM | POA: Diagnosis not present

## 2018-11-10 DIAGNOSIS — R69 Illness, unspecified: Secondary | ICD-10-CM | POA: Diagnosis not present

## 2018-11-11 ENCOUNTER — Ambulatory Visit: Payer: Medicare HMO | Admitting: Adult Health

## 2018-11-11 DIAGNOSIS — N179 Acute kidney failure, unspecified: Secondary | ICD-10-CM | POA: Diagnosis not present

## 2018-11-17 DIAGNOSIS — R69 Illness, unspecified: Secondary | ICD-10-CM | POA: Diagnosis not present

## 2018-11-17 DIAGNOSIS — I10 Essential (primary) hypertension: Secondary | ICD-10-CM | POA: Diagnosis not present

## 2018-11-22 DIAGNOSIS — J208 Acute bronchitis due to other specified organisms: Secondary | ICD-10-CM | POA: Diagnosis not present

## 2018-11-22 DIAGNOSIS — Z20828 Contact with and (suspected) exposure to other viral communicable diseases: Secondary | ICD-10-CM | POA: Diagnosis not present

## 2018-12-21 DIAGNOSIS — I10 Essential (primary) hypertension: Secondary | ICD-10-CM | POA: Diagnosis not present

## 2018-12-21 DIAGNOSIS — R269 Unspecified abnormalities of gait and mobility: Secondary | ICD-10-CM | POA: Diagnosis not present

## 2018-12-21 DIAGNOSIS — R69 Illness, unspecified: Secondary | ICD-10-CM | POA: Diagnosis not present

## 2018-12-21 DIAGNOSIS — G308 Other Alzheimer's disease: Secondary | ICD-10-CM | POA: Diagnosis not present

## 2018-12-21 DIAGNOSIS — M1710 Unilateral primary osteoarthritis, unspecified knee: Secondary | ICD-10-CM | POA: Diagnosis not present

## 2018-12-27 DIAGNOSIS — R531 Weakness: Secondary | ICD-10-CM | POA: Diagnosis not present

## 2018-12-27 DIAGNOSIS — I119 Hypertensive heart disease without heart failure: Secondary | ICD-10-CM | POA: Diagnosis not present

## 2018-12-29 DIAGNOSIS — R531 Weakness: Secondary | ICD-10-CM | POA: Diagnosis not present

## 2018-12-29 DIAGNOSIS — I119 Hypertensive heart disease without heart failure: Secondary | ICD-10-CM | POA: Diagnosis not present

## 2018-12-31 DIAGNOSIS — R531 Weakness: Secondary | ICD-10-CM | POA: Diagnosis not present

## 2018-12-31 DIAGNOSIS — I119 Hypertensive heart disease without heart failure: Secondary | ICD-10-CM | POA: Diagnosis not present

## 2018-12-31 DIAGNOSIS — R69 Illness, unspecified: Secondary | ICD-10-CM | POA: Diagnosis not present

## 2019-01-05 DIAGNOSIS — R531 Weakness: Secondary | ICD-10-CM | POA: Diagnosis not present

## 2019-01-05 DIAGNOSIS — I119 Hypertensive heart disease without heart failure: Secondary | ICD-10-CM | POA: Diagnosis not present

## 2019-01-12 DIAGNOSIS — I119 Hypertensive heart disease without heart failure: Secondary | ICD-10-CM | POA: Diagnosis not present

## 2019-01-12 DIAGNOSIS — R531 Weakness: Secondary | ICD-10-CM | POA: Diagnosis not present

## 2019-01-13 DIAGNOSIS — M6281 Muscle weakness (generalized): Secondary | ICD-10-CM | POA: Diagnosis not present

## 2019-01-13 DIAGNOSIS — R5381 Other malaise: Secondary | ICD-10-CM | POA: Diagnosis not present

## 2019-01-13 DIAGNOSIS — R69 Illness, unspecified: Secondary | ICD-10-CM | POA: Diagnosis not present

## 2019-01-14 DIAGNOSIS — M6281 Muscle weakness (generalized): Secondary | ICD-10-CM | POA: Diagnosis not present

## 2019-01-14 DIAGNOSIS — R5381 Other malaise: Secondary | ICD-10-CM | POA: Diagnosis not present

## 2019-01-14 DIAGNOSIS — I119 Hypertensive heart disease without heart failure: Secondary | ICD-10-CM | POA: Diagnosis not present

## 2019-01-14 DIAGNOSIS — R531 Weakness: Secondary | ICD-10-CM | POA: Diagnosis not present

## 2019-01-18 DIAGNOSIS — M6281 Muscle weakness (generalized): Secondary | ICD-10-CM | POA: Diagnosis not present

## 2019-01-18 DIAGNOSIS — R5381 Other malaise: Secondary | ICD-10-CM | POA: Diagnosis not present

## 2019-01-20 DIAGNOSIS — R5381 Other malaise: Secondary | ICD-10-CM | POA: Diagnosis not present

## 2019-01-20 DIAGNOSIS — R531 Weakness: Secondary | ICD-10-CM | POA: Diagnosis not present

## 2019-01-20 DIAGNOSIS — I119 Hypertensive heart disease without heart failure: Secondary | ICD-10-CM | POA: Diagnosis not present

## 2019-01-20 DIAGNOSIS — M6281 Muscle weakness (generalized): Secondary | ICD-10-CM | POA: Diagnosis not present

## 2019-01-25 DIAGNOSIS — R5381 Other malaise: Secondary | ICD-10-CM | POA: Diagnosis not present

## 2019-01-25 DIAGNOSIS — M6281 Muscle weakness (generalized): Secondary | ICD-10-CM | POA: Diagnosis not present

## 2019-01-27 DIAGNOSIS — R5381 Other malaise: Secondary | ICD-10-CM | POA: Diagnosis not present

## 2019-01-27 DIAGNOSIS — M6281 Muscle weakness (generalized): Secondary | ICD-10-CM | POA: Diagnosis not present

## 2019-01-29 DIAGNOSIS — R531 Weakness: Secondary | ICD-10-CM | POA: Diagnosis not present

## 2019-01-29 DIAGNOSIS — I119 Hypertensive heart disease without heart failure: Secondary | ICD-10-CM | POA: Diagnosis not present

## 2019-02-02 DIAGNOSIS — I119 Hypertensive heart disease without heart failure: Secondary | ICD-10-CM | POA: Diagnosis not present

## 2019-02-02 DIAGNOSIS — R531 Weakness: Secondary | ICD-10-CM | POA: Diagnosis not present

## 2019-02-03 DIAGNOSIS — R5381 Other malaise: Secondary | ICD-10-CM | POA: Diagnosis not present

## 2019-02-03 DIAGNOSIS — M6281 Muscle weakness (generalized): Secondary | ICD-10-CM | POA: Diagnosis not present

## 2019-02-08 DIAGNOSIS — R5381 Other malaise: Secondary | ICD-10-CM | POA: Diagnosis not present

## 2019-02-08 DIAGNOSIS — M6281 Muscle weakness (generalized): Secondary | ICD-10-CM | POA: Diagnosis not present

## 2019-02-09 DIAGNOSIS — R531 Weakness: Secondary | ICD-10-CM | POA: Diagnosis not present

## 2019-02-09 DIAGNOSIS — I119 Hypertensive heart disease without heart failure: Secondary | ICD-10-CM | POA: Diagnosis not present

## 2019-02-10 DIAGNOSIS — R5381 Other malaise: Secondary | ICD-10-CM | POA: Diagnosis not present

## 2019-02-10 DIAGNOSIS — M6281 Muscle weakness (generalized): Secondary | ICD-10-CM | POA: Diagnosis not present

## 2019-02-10 DIAGNOSIS — R69 Illness, unspecified: Secondary | ICD-10-CM | POA: Diagnosis not present

## 2019-02-13 DIAGNOSIS — R488 Other symbolic dysfunctions: Secondary | ICD-10-CM | POA: Diagnosis not present

## 2019-02-13 DIAGNOSIS — M19049 Primary osteoarthritis, unspecified hand: Secondary | ICD-10-CM | POA: Diagnosis not present

## 2019-02-13 DIAGNOSIS — R69 Illness, unspecified: Secondary | ICD-10-CM | POA: Diagnosis not present

## 2019-02-15 DIAGNOSIS — I119 Hypertensive heart disease without heart failure: Secondary | ICD-10-CM | POA: Diagnosis not present

## 2019-02-15 DIAGNOSIS — R531 Weakness: Secondary | ICD-10-CM | POA: Diagnosis not present

## 2019-02-16 DIAGNOSIS — M6281 Muscle weakness (generalized): Secondary | ICD-10-CM | POA: Diagnosis not present

## 2019-02-16 DIAGNOSIS — R5381 Other malaise: Secondary | ICD-10-CM | POA: Diagnosis not present

## 2019-02-20 DIAGNOSIS — R69 Illness, unspecified: Secondary | ICD-10-CM | POA: Diagnosis not present

## 2019-02-20 DIAGNOSIS — R488 Other symbolic dysfunctions: Secondary | ICD-10-CM | POA: Diagnosis not present

## 2019-02-20 DIAGNOSIS — M19049 Primary osteoarthritis, unspecified hand: Secondary | ICD-10-CM | POA: Diagnosis not present

## 2019-02-22 DIAGNOSIS — M6281 Muscle weakness (generalized): Secondary | ICD-10-CM | POA: Diagnosis not present

## 2019-02-22 DIAGNOSIS — R5381 Other malaise: Secondary | ICD-10-CM | POA: Diagnosis not present

## 2019-02-24 DIAGNOSIS — R5381 Other malaise: Secondary | ICD-10-CM | POA: Diagnosis not present

## 2019-02-24 DIAGNOSIS — M6281 Muscle weakness (generalized): Secondary | ICD-10-CM | POA: Diagnosis not present

## 2019-02-25 DIAGNOSIS — I119 Hypertensive heart disease without heart failure: Secondary | ICD-10-CM | POA: Diagnosis not present

## 2019-02-25 DIAGNOSIS — R531 Weakness: Secondary | ICD-10-CM | POA: Diagnosis not present

## 2019-02-27 DIAGNOSIS — R69 Illness, unspecified: Secondary | ICD-10-CM | POA: Diagnosis not present

## 2019-02-27 DIAGNOSIS — R488 Other symbolic dysfunctions: Secondary | ICD-10-CM | POA: Diagnosis not present

## 2019-02-27 DIAGNOSIS — M19049 Primary osteoarthritis, unspecified hand: Secondary | ICD-10-CM | POA: Diagnosis not present

## 2019-02-28 DIAGNOSIS — R531 Weakness: Secondary | ICD-10-CM | POA: Diagnosis not present

## 2019-02-28 DIAGNOSIS — I119 Hypertensive heart disease without heart failure: Secondary | ICD-10-CM | POA: Diagnosis not present

## 2019-03-01 DIAGNOSIS — M6281 Muscle weakness (generalized): Secondary | ICD-10-CM | POA: Diagnosis not present

## 2019-03-01 DIAGNOSIS — R5381 Other malaise: Secondary | ICD-10-CM | POA: Diagnosis not present

## 2019-03-01 DIAGNOSIS — Z20828 Contact with and (suspected) exposure to other viral communicable diseases: Secondary | ICD-10-CM | POA: Diagnosis not present

## 2019-03-03 DIAGNOSIS — I119 Hypertensive heart disease without heart failure: Secondary | ICD-10-CM | POA: Diagnosis not present

## 2019-03-03 DIAGNOSIS — R531 Weakness: Secondary | ICD-10-CM | POA: Diagnosis not present

## 2019-03-03 DIAGNOSIS — M6281 Muscle weakness (generalized): Secondary | ICD-10-CM | POA: Diagnosis not present

## 2019-03-03 DIAGNOSIS — R5381 Other malaise: Secondary | ICD-10-CM | POA: Diagnosis not present

## 2019-03-06 DIAGNOSIS — R488 Other symbolic dysfunctions: Secondary | ICD-10-CM | POA: Diagnosis not present

## 2019-03-06 DIAGNOSIS — R69 Illness, unspecified: Secondary | ICD-10-CM | POA: Diagnosis not present

## 2019-03-06 DIAGNOSIS — M19049 Primary osteoarthritis, unspecified hand: Secondary | ICD-10-CM | POA: Diagnosis not present

## 2019-03-07 DIAGNOSIS — M6281 Muscle weakness (generalized): Secondary | ICD-10-CM | POA: Diagnosis not present

## 2019-03-07 DIAGNOSIS — R5381 Other malaise: Secondary | ICD-10-CM | POA: Diagnosis not present

## 2019-03-08 DIAGNOSIS — R531 Weakness: Secondary | ICD-10-CM | POA: Diagnosis not present

## 2019-03-08 DIAGNOSIS — I119 Hypertensive heart disease without heart failure: Secondary | ICD-10-CM | POA: Diagnosis not present

## 2019-03-10 DIAGNOSIS — R5381 Other malaise: Secondary | ICD-10-CM | POA: Diagnosis not present

## 2019-03-10 DIAGNOSIS — R531 Weakness: Secondary | ICD-10-CM | POA: Diagnosis not present

## 2019-03-10 DIAGNOSIS — R69 Illness, unspecified: Secondary | ICD-10-CM | POA: Diagnosis not present

## 2019-03-10 DIAGNOSIS — M6281 Muscle weakness (generalized): Secondary | ICD-10-CM | POA: Diagnosis not present

## 2019-03-10 DIAGNOSIS — I119 Hypertensive heart disease without heart failure: Secondary | ICD-10-CM | POA: Diagnosis not present

## 2019-03-13 DIAGNOSIS — R488 Other symbolic dysfunctions: Secondary | ICD-10-CM | POA: Diagnosis not present

## 2019-03-13 DIAGNOSIS — R69 Illness, unspecified: Secondary | ICD-10-CM | POA: Diagnosis not present

## 2019-03-13 DIAGNOSIS — M19049 Primary osteoarthritis, unspecified hand: Secondary | ICD-10-CM | POA: Diagnosis not present

## 2019-03-14 DIAGNOSIS — R5381 Other malaise: Secondary | ICD-10-CM | POA: Diagnosis not present

## 2019-03-14 DIAGNOSIS — M6281 Muscle weakness (generalized): Secondary | ICD-10-CM | POA: Diagnosis not present

## 2019-03-15 ENCOUNTER — Ambulatory Visit: Payer: Self-pay | Admitting: *Deleted

## 2019-03-15 ENCOUNTER — Telehealth: Payer: Self-pay | Admitting: Adult Health

## 2019-03-15 NOTE — Telephone Encounter (Signed)
Spoke with pt's daughter Ishmael Holter because she would like to know the pt's allergies; she was informed that the only allergy listed is Depakote; she verbalized understanding; the pt was seen by [facility] Nephew, [facility]; will route to office for notification.   Reason for Disposition . Health Information question, no triage required and triager able to answer question  Answer Assessment - Initial Assessment Questions 1. REASON FOR CALL or QUESTION: "What is your reason for calling today?" or "How can I best help you?" or "What question do you have that I can help answer?"     What are pt's drug allergies  Protocols used: INFORMATION ONLY CALL - NO TRIAGE-A-AH

## 2019-03-15 NOTE — Telephone Encounter (Addendum)
Summary: call back    Patient daughter called in to see if patient was allergic to any medications. Patients daughter would like to give her antibiotics but doesn't want to do so until shes sure MOM (Patient) isn't allergic to anything.      Attempted to call daughter- left message to return call. (Called home number also- not sure that we can disclose patient info to daughter- so trying to reach husband.)

## 2019-03-17 DIAGNOSIS — R531 Weakness: Secondary | ICD-10-CM | POA: Diagnosis not present

## 2019-03-17 DIAGNOSIS — R5381 Other malaise: Secondary | ICD-10-CM | POA: Diagnosis not present

## 2019-03-17 DIAGNOSIS — I119 Hypertensive heart disease without heart failure: Secondary | ICD-10-CM | POA: Diagnosis not present

## 2019-03-17 DIAGNOSIS — M6281 Muscle weakness (generalized): Secondary | ICD-10-CM | POA: Diagnosis not present

## 2019-03-17 DIAGNOSIS — M19049 Primary osteoarthritis, unspecified hand: Secondary | ICD-10-CM | POA: Diagnosis not present

## 2019-03-17 DIAGNOSIS — R69 Illness, unspecified: Secondary | ICD-10-CM | POA: Diagnosis not present

## 2019-03-17 DIAGNOSIS — R488 Other symbolic dysfunctions: Secondary | ICD-10-CM | POA: Diagnosis not present

## 2019-03-18 DIAGNOSIS — I119 Hypertensive heart disease without heart failure: Secondary | ICD-10-CM | POA: Diagnosis not present

## 2019-03-18 DIAGNOSIS — R531 Weakness: Secondary | ICD-10-CM | POA: Diagnosis not present

## 2019-03-19 DIAGNOSIS — R488 Other symbolic dysfunctions: Secondary | ICD-10-CM | POA: Diagnosis not present

## 2019-03-19 DIAGNOSIS — M19049 Primary osteoarthritis, unspecified hand: Secondary | ICD-10-CM | POA: Diagnosis not present

## 2019-03-19 DIAGNOSIS — R69 Illness, unspecified: Secondary | ICD-10-CM | POA: Diagnosis not present

## 2019-03-22 DIAGNOSIS — R531 Weakness: Secondary | ICD-10-CM | POA: Diagnosis not present

## 2019-03-22 DIAGNOSIS — R5381 Other malaise: Secondary | ICD-10-CM | POA: Diagnosis not present

## 2019-03-22 DIAGNOSIS — M6281 Muscle weakness (generalized): Secondary | ICD-10-CM | POA: Diagnosis not present

## 2019-03-22 DIAGNOSIS — I119 Hypertensive heart disease without heart failure: Secondary | ICD-10-CM | POA: Diagnosis not present

## 2019-03-24 DIAGNOSIS — R5381 Other malaise: Secondary | ICD-10-CM | POA: Diagnosis not present

## 2019-03-24 DIAGNOSIS — M6281 Muscle weakness (generalized): Secondary | ICD-10-CM | POA: Diagnosis not present

## 2019-03-24 DIAGNOSIS — R488 Other symbolic dysfunctions: Secondary | ICD-10-CM | POA: Diagnosis not present

## 2019-03-24 DIAGNOSIS — M19049 Primary osteoarthritis, unspecified hand: Secondary | ICD-10-CM | POA: Diagnosis not present

## 2019-03-24 DIAGNOSIS — R69 Illness, unspecified: Secondary | ICD-10-CM | POA: Diagnosis not present

## 2019-03-27 DIAGNOSIS — M19049 Primary osteoarthritis, unspecified hand: Secondary | ICD-10-CM | POA: Diagnosis not present

## 2019-03-27 DIAGNOSIS — R69 Illness, unspecified: Secondary | ICD-10-CM | POA: Diagnosis not present

## 2019-03-27 DIAGNOSIS — R488 Other symbolic dysfunctions: Secondary | ICD-10-CM | POA: Diagnosis not present

## 2019-03-29 DIAGNOSIS — M6281 Muscle weakness (generalized): Secondary | ICD-10-CM | POA: Diagnosis not present

## 2019-03-29 DIAGNOSIS — M19049 Primary osteoarthritis, unspecified hand: Secondary | ICD-10-CM | POA: Diagnosis not present

## 2019-03-29 DIAGNOSIS — R69 Illness, unspecified: Secondary | ICD-10-CM | POA: Diagnosis not present

## 2019-03-29 DIAGNOSIS — R5381 Other malaise: Secondary | ICD-10-CM | POA: Diagnosis not present

## 2019-03-29 DIAGNOSIS — R488 Other symbolic dysfunctions: Secondary | ICD-10-CM | POA: Diagnosis not present

## 2019-03-30 DIAGNOSIS — I119 Hypertensive heart disease without heart failure: Secondary | ICD-10-CM | POA: Diagnosis not present

## 2019-03-30 DIAGNOSIS — R531 Weakness: Secondary | ICD-10-CM | POA: Diagnosis not present

## 2019-03-31 DIAGNOSIS — I119 Hypertensive heart disease without heart failure: Secondary | ICD-10-CM | POA: Diagnosis not present

## 2019-03-31 DIAGNOSIS — R488 Other symbolic dysfunctions: Secondary | ICD-10-CM | POA: Diagnosis not present

## 2019-03-31 DIAGNOSIS — M19049 Primary osteoarthritis, unspecified hand: Secondary | ICD-10-CM | POA: Diagnosis not present

## 2019-03-31 DIAGNOSIS — R531 Weakness: Secondary | ICD-10-CM | POA: Diagnosis not present

## 2019-03-31 DIAGNOSIS — R69 Illness, unspecified: Secondary | ICD-10-CM | POA: Diagnosis not present

## 2019-04-05 DIAGNOSIS — R5381 Other malaise: Secondary | ICD-10-CM | POA: Diagnosis not present

## 2019-04-05 DIAGNOSIS — R531 Weakness: Secondary | ICD-10-CM | POA: Diagnosis not present

## 2019-04-05 DIAGNOSIS — M6281 Muscle weakness (generalized): Secondary | ICD-10-CM | POA: Diagnosis not present

## 2019-04-05 DIAGNOSIS — I119 Hypertensive heart disease without heart failure: Secondary | ICD-10-CM | POA: Diagnosis not present

## 2019-04-06 DIAGNOSIS — I119 Hypertensive heart disease without heart failure: Secondary | ICD-10-CM | POA: Diagnosis not present

## 2019-04-06 DIAGNOSIS — R531 Weakness: Secondary | ICD-10-CM | POA: Diagnosis not present

## 2019-04-07 DIAGNOSIS — M19049 Primary osteoarthritis, unspecified hand: Secondary | ICD-10-CM | POA: Diagnosis not present

## 2019-04-07 DIAGNOSIS — R5381 Other malaise: Secondary | ICD-10-CM | POA: Diagnosis not present

## 2019-04-07 DIAGNOSIS — M6281 Muscle weakness (generalized): Secondary | ICD-10-CM | POA: Diagnosis not present

## 2019-04-07 DIAGNOSIS — R488 Other symbolic dysfunctions: Secondary | ICD-10-CM | POA: Diagnosis not present

## 2019-04-07 DIAGNOSIS — R69 Illness, unspecified: Secondary | ICD-10-CM | POA: Diagnosis not present

## 2019-04-11 DIAGNOSIS — I119 Hypertensive heart disease without heart failure: Secondary | ICD-10-CM | POA: Diagnosis not present

## 2019-04-11 DIAGNOSIS — R531 Weakness: Secondary | ICD-10-CM | POA: Diagnosis not present

## 2019-04-12 DIAGNOSIS — R69 Illness, unspecified: Secondary | ICD-10-CM | POA: Diagnosis not present

## 2019-04-12 DIAGNOSIS — M6281 Muscle weakness (generalized): Secondary | ICD-10-CM | POA: Diagnosis not present

## 2019-04-12 DIAGNOSIS — R488 Other symbolic dysfunctions: Secondary | ICD-10-CM | POA: Diagnosis not present

## 2019-04-12 DIAGNOSIS — M19049 Primary osteoarthritis, unspecified hand: Secondary | ICD-10-CM | POA: Diagnosis not present

## 2019-04-12 DIAGNOSIS — R5381 Other malaise: Secondary | ICD-10-CM | POA: Diagnosis not present

## 2019-04-14 DIAGNOSIS — M6281 Muscle weakness (generalized): Secondary | ICD-10-CM | POA: Diagnosis not present

## 2019-04-14 DIAGNOSIS — R69 Illness, unspecified: Secondary | ICD-10-CM | POA: Diagnosis not present

## 2019-04-14 DIAGNOSIS — M19049 Primary osteoarthritis, unspecified hand: Secondary | ICD-10-CM | POA: Diagnosis not present

## 2019-04-14 DIAGNOSIS — R5381 Other malaise: Secondary | ICD-10-CM | POA: Diagnosis not present

## 2019-04-14 DIAGNOSIS — R488 Other symbolic dysfunctions: Secondary | ICD-10-CM | POA: Diagnosis not present

## 2019-04-18 DIAGNOSIS — M6281 Muscle weakness (generalized): Secondary | ICD-10-CM | POA: Diagnosis not present

## 2019-04-18 DIAGNOSIS — R5381 Other malaise: Secondary | ICD-10-CM | POA: Diagnosis not present

## 2019-04-20 DIAGNOSIS — I119 Hypertensive heart disease without heart failure: Secondary | ICD-10-CM | POA: Diagnosis not present

## 2019-04-20 DIAGNOSIS — R531 Weakness: Secondary | ICD-10-CM | POA: Diagnosis not present

## 2019-04-21 DIAGNOSIS — I119 Hypertensive heart disease without heart failure: Secondary | ICD-10-CM | POA: Diagnosis not present

## 2019-04-21 DIAGNOSIS — R531 Weakness: Secondary | ICD-10-CM | POA: Diagnosis not present

## 2019-04-21 DIAGNOSIS — R5381 Other malaise: Secondary | ICD-10-CM | POA: Diagnosis not present

## 2019-04-21 DIAGNOSIS — R488 Other symbolic dysfunctions: Secondary | ICD-10-CM | POA: Diagnosis not present

## 2019-04-21 DIAGNOSIS — R69 Illness, unspecified: Secondary | ICD-10-CM | POA: Diagnosis not present

## 2019-04-21 DIAGNOSIS — M19049 Primary osteoarthritis, unspecified hand: Secondary | ICD-10-CM | POA: Diagnosis not present

## 2019-04-21 DIAGNOSIS — M6281 Muscle weakness (generalized): Secondary | ICD-10-CM | POA: Diagnosis not present

## 2019-04-25 DIAGNOSIS — M6281 Muscle weakness (generalized): Secondary | ICD-10-CM | POA: Diagnosis not present

## 2019-04-25 DIAGNOSIS — R5381 Other malaise: Secondary | ICD-10-CM | POA: Diagnosis not present

## 2019-04-27 DIAGNOSIS — M19049 Primary osteoarthritis, unspecified hand: Secondary | ICD-10-CM | POA: Diagnosis not present

## 2019-04-27 DIAGNOSIS — R69 Illness, unspecified: Secondary | ICD-10-CM | POA: Diagnosis not present

## 2019-04-27 DIAGNOSIS — R488 Other symbolic dysfunctions: Secondary | ICD-10-CM | POA: Diagnosis not present

## 2019-04-28 DIAGNOSIS — R5381 Other malaise: Secondary | ICD-10-CM | POA: Diagnosis not present

## 2019-04-28 DIAGNOSIS — M6281 Muscle weakness (generalized): Secondary | ICD-10-CM | POA: Diagnosis not present

## 2019-05-01 DIAGNOSIS — M19049 Primary osteoarthritis, unspecified hand: Secondary | ICD-10-CM | POA: Diagnosis not present

## 2019-05-01 DIAGNOSIS — R488 Other symbolic dysfunctions: Secondary | ICD-10-CM | POA: Diagnosis not present

## 2019-05-01 DIAGNOSIS — R69 Illness, unspecified: Secondary | ICD-10-CM | POA: Diagnosis not present

## 2019-05-02 DIAGNOSIS — R5381 Other malaise: Secondary | ICD-10-CM | POA: Diagnosis not present

## 2019-05-02 DIAGNOSIS — M6281 Muscle weakness (generalized): Secondary | ICD-10-CM | POA: Diagnosis not present

## 2019-05-04 DIAGNOSIS — R5381 Other malaise: Secondary | ICD-10-CM | POA: Diagnosis not present

## 2019-05-04 DIAGNOSIS — M6281 Muscle weakness (generalized): Secondary | ICD-10-CM | POA: Diagnosis not present

## 2019-05-09 DIAGNOSIS — R5381 Other malaise: Secondary | ICD-10-CM | POA: Diagnosis not present

## 2019-05-09 DIAGNOSIS — M6281 Muscle weakness (generalized): Secondary | ICD-10-CM | POA: Diagnosis not present

## 2019-05-11 DIAGNOSIS — R5381 Other malaise: Secondary | ICD-10-CM | POA: Diagnosis not present

## 2019-05-11 DIAGNOSIS — M6281 Muscle weakness (generalized): Secondary | ICD-10-CM | POA: Diagnosis not present

## 2019-05-12 DIAGNOSIS — R488 Other symbolic dysfunctions: Secondary | ICD-10-CM | POA: Diagnosis not present

## 2019-05-12 DIAGNOSIS — R69 Illness, unspecified: Secondary | ICD-10-CM | POA: Diagnosis not present

## 2019-05-12 DIAGNOSIS — M19049 Primary osteoarthritis, unspecified hand: Secondary | ICD-10-CM | POA: Diagnosis not present

## 2019-05-14 DIAGNOSIS — R69 Illness, unspecified: Secondary | ICD-10-CM | POA: Diagnosis not present

## 2019-05-14 DIAGNOSIS — M19049 Primary osteoarthritis, unspecified hand: Secondary | ICD-10-CM | POA: Diagnosis not present

## 2019-05-14 DIAGNOSIS — R488 Other symbolic dysfunctions: Secondary | ICD-10-CM | POA: Diagnosis not present

## 2019-05-16 DIAGNOSIS — R5381 Other malaise: Secondary | ICD-10-CM | POA: Diagnosis not present

## 2019-05-16 DIAGNOSIS — M6281 Muscle weakness (generalized): Secondary | ICD-10-CM | POA: Diagnosis not present

## 2019-05-18 DIAGNOSIS — M6281 Muscle weakness (generalized): Secondary | ICD-10-CM | POA: Diagnosis not present

## 2019-05-18 DIAGNOSIS — R5381 Other malaise: Secondary | ICD-10-CM | POA: Diagnosis not present

## 2019-05-19 DIAGNOSIS — M19049 Primary osteoarthritis, unspecified hand: Secondary | ICD-10-CM | POA: Diagnosis not present

## 2019-05-19 DIAGNOSIS — R488 Other symbolic dysfunctions: Secondary | ICD-10-CM | POA: Diagnosis not present

## 2019-05-19 DIAGNOSIS — R69 Illness, unspecified: Secondary | ICD-10-CM | POA: Diagnosis not present

## 2019-05-25 DIAGNOSIS — R69 Illness, unspecified: Secondary | ICD-10-CM | POA: Diagnosis not present

## 2019-05-25 DIAGNOSIS — M19049 Primary osteoarthritis, unspecified hand: Secondary | ICD-10-CM | POA: Diagnosis not present

## 2019-05-25 DIAGNOSIS — R488 Other symbolic dysfunctions: Secondary | ICD-10-CM | POA: Diagnosis not present

## 2019-06-23 DIAGNOSIS — R69 Illness, unspecified: Secondary | ICD-10-CM | POA: Diagnosis not present

## 2019-06-28 DIAGNOSIS — G308 Other Alzheimer's disease: Secondary | ICD-10-CM | POA: Diagnosis not present

## 2019-06-28 DIAGNOSIS — I10 Essential (primary) hypertension: Secondary | ICD-10-CM | POA: Diagnosis not present

## 2019-06-28 DIAGNOSIS — R69 Illness, unspecified: Secondary | ICD-10-CM | POA: Diagnosis not present

## 2019-06-28 DIAGNOSIS — R269 Unspecified abnormalities of gait and mobility: Secondary | ICD-10-CM | POA: Diagnosis not present

## 2019-06-28 DIAGNOSIS — Z743 Need for continuous supervision: Secondary | ICD-10-CM | POA: Diagnosis not present

## 2020-06-04 NOTE — Telephone Encounter (Signed)
error 

## 2020-07-01 IMAGING — DX DG CHEST 2V
2 series · 2 of 2 positions shown · non-contrast
Comparison: No prior.

CLINICAL DATA: Exam for [REDACTED] placement.

EXAM:
CHEST - 2 VIEW

[chest pa]
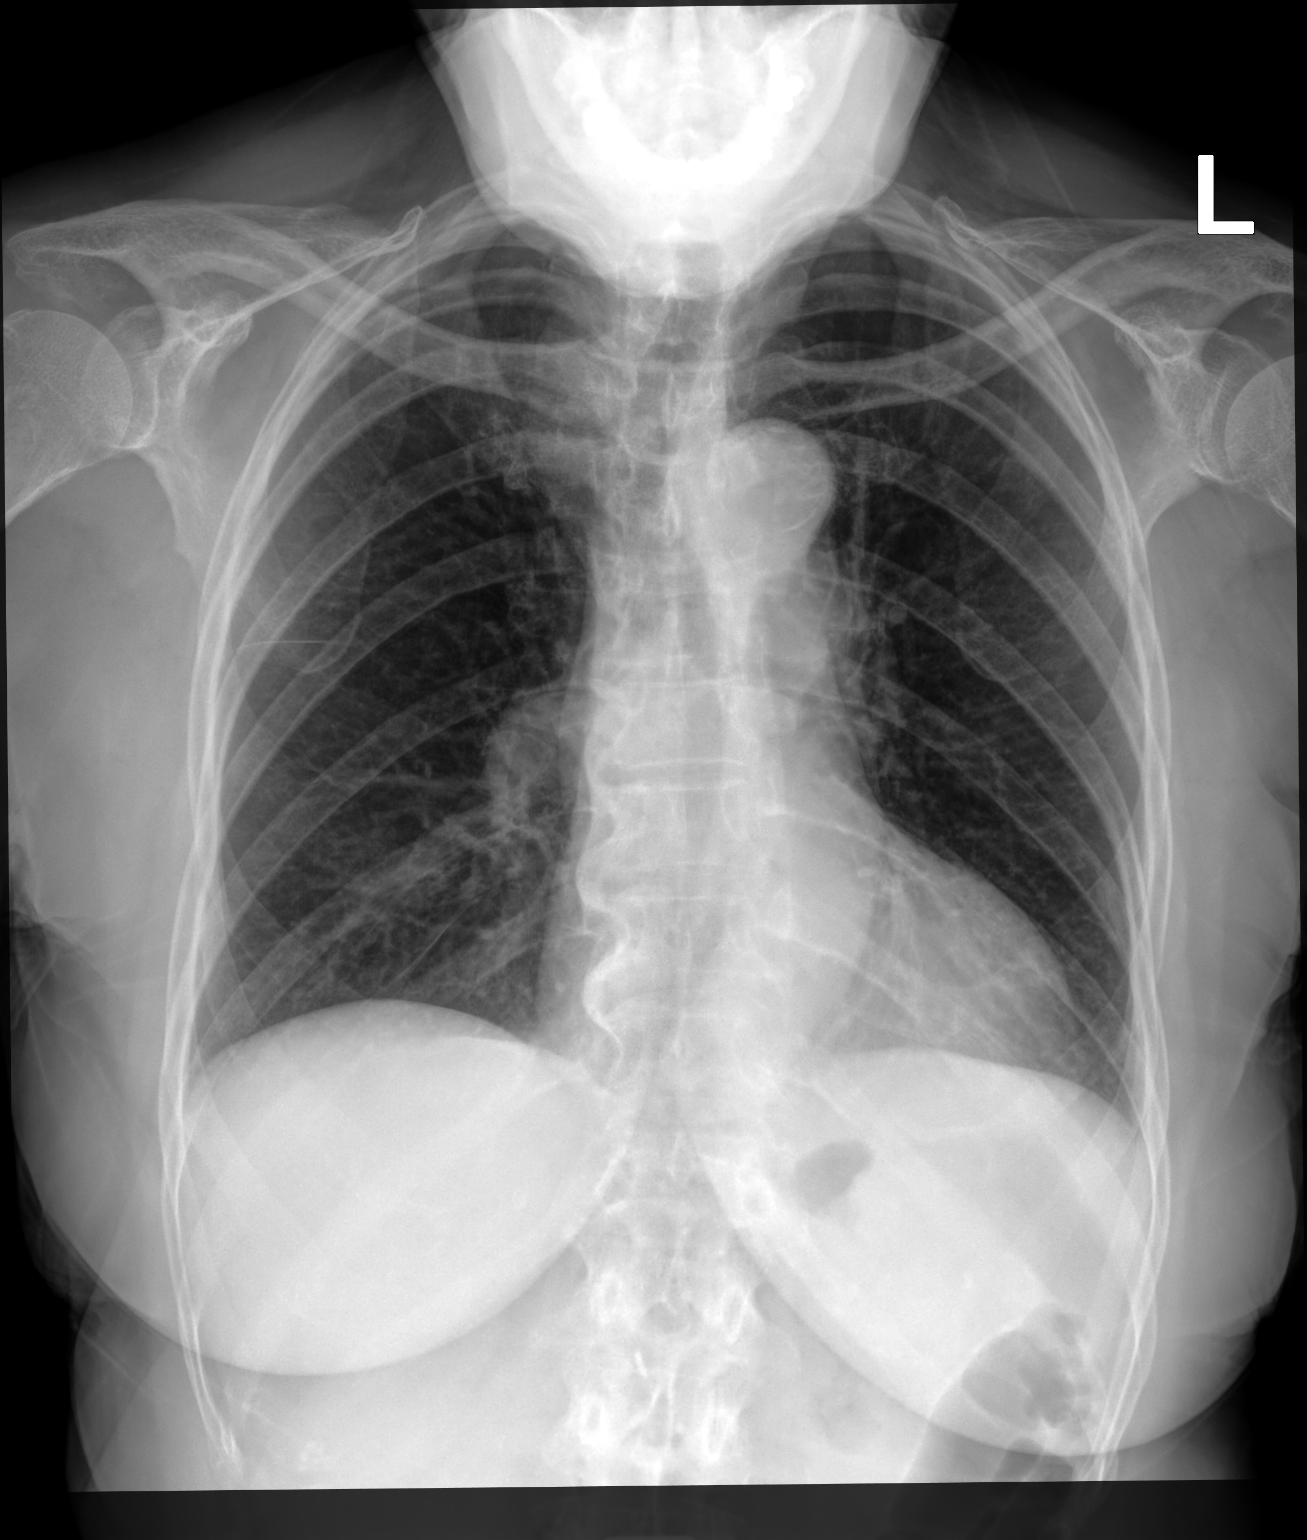

[chest lat]
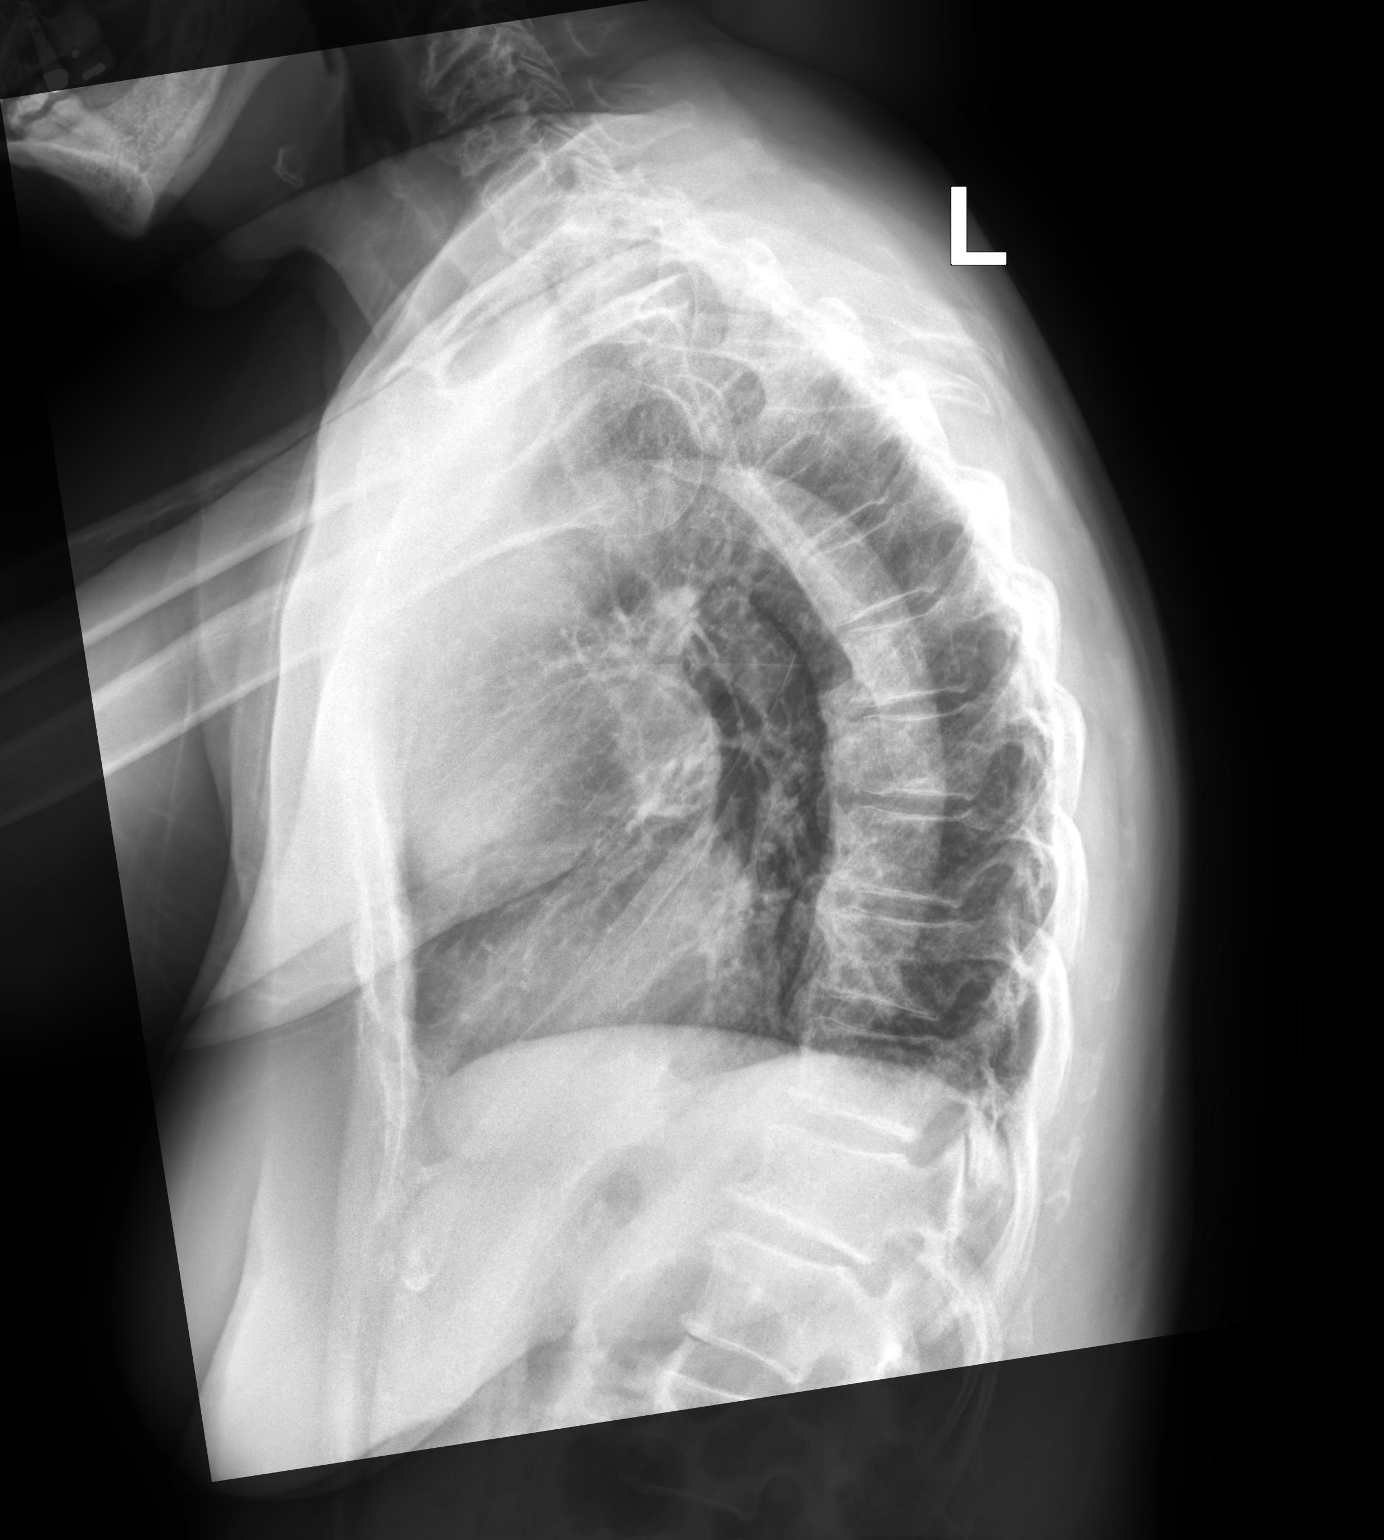

[2 of 2 positions shown; findings below may reference images not displayed]

FINDINGS: Mediastinum and hilar structures normal. Cardiomegaly with normal
pulmonary vascularity. No focal infiltrate. No pleural effusion or
pneumothorax. No acute bony abnormality identified.
IMPRESSION: Cardiomegaly with normal pulmonary vascularity. No focal infiltrate.

## 2023-06-03 DEATH — deceased
